# Patient Record
Sex: Female | Born: 1950 | Race: Black or African American | Hispanic: No | Marital: Single | Smoking: Never smoker
Health system: Southern US, Community
[De-identification: ages and names within clinical notes are randomized; demographics above are authoritative.]

## PROBLEM LIST (undated history)

## (undated) DIAGNOSIS — B1920 Unspecified viral hepatitis C without hepatic coma: Secondary | ICD-10-CM

## (undated) HISTORY — DX: Unspecified viral hepatitis C without hepatic coma: B19.20

---

## 2016-05-04 LAB — HM COLONOSCOPY

## 2018-11-11 DIAGNOSIS — I639 Cerebral infarction, unspecified: Secondary | ICD-10-CM | POA: Insufficient documentation

## 2019-06-14 DIAGNOSIS — N179 Acute kidney failure, unspecified: Secondary | ICD-10-CM | POA: Insufficient documentation

## 2019-06-23 LAB — MICROALBUMIN, URINE: Microalb, Ur: 5.2

## 2019-08-26 ENCOUNTER — Emergency Department (HOSPITAL_COMMUNITY): Payer: Medicare Other

## 2019-08-26 ENCOUNTER — Inpatient Hospital Stay (HOSPITAL_COMMUNITY)
Admission: EM | Admit: 2019-08-26 | Discharge: 2019-08-30 | DRG: 871 | Disposition: A | Discharge status: Home Health | Payer: Medicare Other | Attending: Family Medicine | Admitting: Family Medicine

## 2019-08-26 DIAGNOSIS — Z79899 Other long term (current) drug therapy: Secondary | ICD-10-CM

## 2019-08-26 DIAGNOSIS — A4151 Sepsis due to Escherichia coli [E. coli]: Secondary | ICD-10-CM | POA: Diagnosis present

## 2019-08-26 DIAGNOSIS — R197 Diarrhea, unspecified: Secondary | ICD-10-CM | POA: Diagnosis not present

## 2019-08-26 DIAGNOSIS — M069 Rheumatoid arthritis, unspecified: Secondary | ICD-10-CM | POA: Diagnosis present

## 2019-08-26 DIAGNOSIS — Z7952 Long term (current) use of systemic steroids: Secondary | ICD-10-CM | POA: Diagnosis not present

## 2019-08-26 DIAGNOSIS — Z6833 Body mass index (BMI) 33.0-33.9, adult: Secondary | ICD-10-CM | POA: Diagnosis not present

## 2019-08-26 DIAGNOSIS — Z8739 Personal history of other diseases of the musculoskeletal system and connective tissue: Secondary | ICD-10-CM | POA: Diagnosis not present

## 2019-08-26 DIAGNOSIS — N390 Urinary tract infection, site not specified: Secondary | ICD-10-CM | POA: Diagnosis present

## 2019-08-26 DIAGNOSIS — Z794 Long term (current) use of insulin: Secondary | ICD-10-CM

## 2019-08-26 DIAGNOSIS — R7881 Bacteremia: Secondary | ICD-10-CM | POA: Diagnosis not present

## 2019-08-26 DIAGNOSIS — D696 Thrombocytopenia, unspecified: Secondary | ICD-10-CM | POA: Diagnosis present

## 2019-08-26 DIAGNOSIS — E871 Hypo-osmolality and hyponatremia: Secondary | ICD-10-CM | POA: Diagnosis not present

## 2019-08-26 DIAGNOSIS — R932 Abnormal findings on diagnostic imaging of liver and biliary tract: Secondary | ICD-10-CM | POA: Diagnosis not present

## 2019-08-26 DIAGNOSIS — Z8673 Personal history of transient ischemic attack (TIA), and cerebral infarction without residual deficits: Secondary | ICD-10-CM

## 2019-08-26 DIAGNOSIS — K7689 Other specified diseases of liver: Secondary | ICD-10-CM | POA: Diagnosis present

## 2019-08-26 DIAGNOSIS — E119 Type 2 diabetes mellitus without complications: Secondary | ICD-10-CM

## 2019-08-26 DIAGNOSIS — E43 Unspecified severe protein-calorie malnutrition: Secondary | ICD-10-CM | POA: Diagnosis present

## 2019-08-26 DIAGNOSIS — E1165 Type 2 diabetes mellitus with hyperglycemia: Secondary | ICD-10-CM | POA: Diagnosis present

## 2019-08-26 DIAGNOSIS — B9620 Unspecified Escherichia coli [E. coli] as the cause of diseases classified elsewhere: Secondary | ICD-10-CM | POA: Diagnosis present

## 2019-08-26 DIAGNOSIS — A4152 Sepsis due to Pseudomonas: Secondary | ICD-10-CM | POA: Diagnosis present

## 2019-08-26 DIAGNOSIS — Z1612 Extended spectrum beta lactamase (ESBL) resistance: Secondary | ICD-10-CM | POA: Diagnosis present

## 2019-08-26 DIAGNOSIS — E778 Other disorders of glycoprotein metabolism: Secondary | ICD-10-CM | POA: Diagnosis present

## 2019-08-26 DIAGNOSIS — Z8616 Personal history of COVID-19: Secondary | ICD-10-CM | POA: Diagnosis not present

## 2019-08-26 DIAGNOSIS — I517 Cardiomegaly: Secondary | ICD-10-CM | POA: Diagnosis present

## 2019-08-26 DIAGNOSIS — I4581 Long QT syndrome: Secondary | ICD-10-CM | POA: Diagnosis present

## 2019-08-26 DIAGNOSIS — R531 Weakness: Secondary | ICD-10-CM | POA: Diagnosis not present

## 2019-08-26 DIAGNOSIS — E669 Obesity, unspecified: Secondary | ICD-10-CM | POA: Diagnosis present

## 2019-08-26 DIAGNOSIS — R6510 Systemic inflammatory response syndrome (SIRS) of non-infectious origin without acute organ dysfunction: Secondary | ICD-10-CM | POA: Diagnosis present

## 2019-08-26 DIAGNOSIS — R1031 Right lower quadrant pain: Secondary | ICD-10-CM | POA: Diagnosis not present

## 2019-08-26 DIAGNOSIS — E860 Dehydration: Secondary | ICD-10-CM | POA: Diagnosis present

## 2019-08-26 DIAGNOSIS — R8281 Pyuria: Secondary | ICD-10-CM | POA: Diagnosis not present

## 2019-08-26 DIAGNOSIS — I491 Atrial premature depolarization: Secondary | ICD-10-CM | POA: Diagnosis present

## 2019-08-26 LAB — LIPID PANEL
Cholesterol: 95 mg/dL (ref 0–200)
HDL: 32 mg/dL — ABNORMAL LOW (ref >40)
LDL Cholesterol: 44 mg/dL (ref 0–99)
Total CHOL/HDL Ratio: 3 RATIO
Triglycerides: 95 mg/dL (ref <150)
VLDL: 19 mg/dL (ref 0–40)

## 2019-08-26 LAB — COMPREHENSIVE METABOLIC PANEL
ALT: 46 U/L — ABNORMAL HIGH (ref 0–44)
AST: 34 U/L (ref 15–41)
Albumin: 2.5 g/dL — ABNORMAL LOW (ref 3.5–5.0)
Alkaline Phosphatase: 73 U/L (ref 38–126)
Anion gap: 11 (ref 5–15)
BUN: 14 mg/dL (ref 8–23)
CO2: 20 mmol/L — ABNORMAL LOW (ref 22–32)
Calcium: 7.9 mg/dL — ABNORMAL LOW (ref 8.9–10.3)
Chloride: 97 mmol/L — ABNORMAL LOW (ref 98–111)
Creatinine, Ser: 0.83 mg/dL (ref 0.44–1.00)
GFR calc Af Amer: >60 mL/min (ref >60)
GFR calc non Af Amer: >60 mL/min (ref >60)
Glucose, Bld: 280 mg/dL — ABNORMAL HIGH (ref 70–99)
Potassium: 3.5 mmol/L (ref 3.5–5.1)
Sodium: 128 mmol/L — ABNORMAL LOW (ref 135–145)
Total Bilirubin: 1.2 mg/dL (ref 0.3–1.2)
Total Protein: 6.1 g/dL — ABNORMAL LOW (ref 6.5–8.1)

## 2019-08-26 LAB — GLUCOSE, CAPILLARY: Glucose-Capillary: 317 mg/dL — ABNORMAL HIGH (ref 70–99)

## 2019-08-26 LAB — URINALYSIS, ROUTINE W REFLEX MICROSCOPIC
Bacteria, UA: NONE SEEN
Bilirubin Urine: NEGATIVE
Glucose, UA: NEGATIVE mg/dL
Ketones, ur: NEGATIVE mg/dL
Nitrite: NEGATIVE
Protein, ur: NEGATIVE mg/dL
Specific Gravity, Urine: 1.033 — ABNORMAL HIGH (ref 1.005–1.030)
pH: 7 (ref 5.0–8.0)

## 2019-08-26 LAB — CBC WITH DIFFERENTIAL/PLATELET
Abs Immature Granulocytes: 0.09 10*3/uL — ABNORMAL HIGH (ref 0.00–0.07)
Basophils Absolute: 0 10*3/uL (ref 0.0–0.1)
Basophils Relative: 0 %
Eosinophils Absolute: 0.3 10*3/uL (ref 0.0–0.5)
Eosinophils Relative: 4 %
HCT: 41.6 % (ref 36.0–46.0)
Hemoglobin: 14.2 g/dL (ref 12.0–15.0)
Immature Granulocytes: 1 %
Lymphocytes Relative: 24 %
Lymphs Abs: 2.1 10*3/uL (ref 0.7–4.0)
MCH: 33.6 pg (ref 26.0–34.0)
MCHC: 34.1 g/dL (ref 30.0–36.0)
MCV: 98.6 fL (ref 80.0–100.0)
Monocytes Absolute: 0.5 10*3/uL (ref 0.1–1.0)
Monocytes Relative: 6 %
Neutro Abs: 5.8 10*3/uL (ref 1.7–7.7)
Neutrophils Relative %: 65 %
Platelets: 164 10*3/uL (ref 150–400)
RBC: 4.22 MIL/uL (ref 3.87–5.11)
RDW: 12.4 % (ref 11.5–15.5)
WBC: 8.8 10*3/uL (ref 4.0–10.5)
nRBC: 0 % (ref 0.0–0.2)

## 2019-08-26 LAB — LIPASE, BLOOD: Lipase: 71 U/L — ABNORMAL HIGH (ref 11–51)

## 2019-08-26 LAB — SODIUM, URINE, RANDOM: Sodium, Ur: 44 mmol/L

## 2019-08-26 LAB — MAGNESIUM: Magnesium: 1.9 mg/dL (ref 1.7–2.4)

## 2019-08-26 LAB — LACTIC ACID, PLASMA
Lactic Acid, Venous: 2 mmol/L (ref 0.5–1.9)
Lactic Acid, Venous: 1.4 mmol/L (ref 0.5–1.9)

## 2019-08-26 LAB — MRSA PCR SCREENING: MRSA by PCR: NEGATIVE

## 2019-08-26 LAB — CBG MONITORING, ED: Glucose-Capillary: 285 mg/dL — ABNORMAL HIGH (ref 70–99)

## 2019-08-26 MED ORDER — SODIUM CHLORIDE 0.9 % IV BOLUS (SEPSIS)
1000.0000 mL | Freq: Once | INTRAVENOUS | Status: AC
  Administered 2019-08-26: 1000 mL via INTRAVENOUS

## 2019-08-26 MED ORDER — MORPHINE SULFATE (PF) 4 MG/ML IV SOLN
4.0000 mg | Freq: Once | INTRAVENOUS | Status: AC
  Administered 2019-08-26: 4 mg via INTRAVENOUS
  Filled 2019-08-26: qty 1

## 2019-08-26 MED ORDER — ACETAMINOPHEN 325 MG PO TABS
650.0000 mg | ORAL_TABLET | Freq: Four times a day (QID) | ORAL | Status: DC | PRN
  Administered 2019-08-26: 650 mg via ORAL
  Filled 2019-08-26: qty 2

## 2019-08-26 MED ORDER — ACETAMINOPHEN 650 MG RE SUPP: 650.0000 mg | Freq: Four times a day (QID) | RECTAL | Status: DC | PRN

## 2019-08-26 MED ORDER — SODIUM CHLORIDE 0.9 % IV SOLN: INTRAVENOUS | Status: DC

## 2019-08-26 MED ORDER — INSULIN ASPART 100 UNIT/ML ~~LOC~~ SOLN
0.0000 [IU] | Freq: Every day | SUBCUTANEOUS | Status: DC
  Administered 2019-08-26: 4 [IU] via SUBCUTANEOUS
  Administered 2019-08-27: 3 [IU] via SUBCUTANEOUS
  Administered 2019-08-28: 2 [IU] via SUBCUTANEOUS

## 2019-08-26 MED ORDER — SODIUM CHLORIDE 0.9 % IV BOLUS
1000.0000 mL | Freq: Once | INTRAVENOUS | Status: AC
  Administered 2019-08-26: 1000 mL via INTRAVENOUS

## 2019-08-26 MED ORDER — PANTOPRAZOLE SODIUM 40 MG PO TBEC
40.0000 mg | DELAYED_RELEASE_TABLET | Freq: Every day | ORAL | Status: DC
  Administered 2019-08-26 – 2019-08-30 (×5): 40 mg via ORAL
  Filled 2019-08-26 (×5): qty 1

## 2019-08-26 MED ORDER — ENOXAPARIN SODIUM 40 MG/0.4ML ~~LOC~~ SOLN
40.0000 mg | SUBCUTANEOUS | Status: DC
  Administered 2019-08-26: 40 mg via SUBCUTANEOUS
  Filled 2019-08-26: qty 0.4

## 2019-08-26 MED ORDER — INSULIN ASPART 100 UNIT/ML ~~LOC~~ SOLN
0.0000 [IU] | Freq: Three times a day (TID) | SUBCUTANEOUS | Status: DC
  Administered 2019-08-26: 5 [IU] via SUBCUTANEOUS
  Administered 2019-08-27: 2 [IU] via SUBCUTANEOUS
  Administered 2019-08-27: 3 [IU] via SUBCUTANEOUS
  Administered 2019-08-27: 7 [IU] via SUBCUTANEOUS
  Administered 2019-08-28: 2 [IU] via SUBCUTANEOUS

## 2019-08-26 MED ORDER — RAMELTEON 8 MG PO TABS
8.0000 mg | ORAL_TABLET | Freq: Once | ORAL | Status: AC
  Administered 2019-08-26: 8 mg via ORAL
  Filled 2019-08-26: qty 1

## 2019-08-26 MED ORDER — IOHEXOL 300 MG/ML  SOLN
100.0000 mL | Freq: Once | INTRAMUSCULAR | Status: AC | PRN
  Administered 2019-08-26: 100 mL via INTRAVENOUS

## 2019-08-26 MED ORDER — FUROSEMIDE 20 MG PO TABS
20.0000 mg | ORAL_TABLET | Freq: Every day | ORAL | Status: DC
  Administered 2019-08-27: 20 mg via ORAL
  Filled 2019-08-26: qty 1

## 2019-08-26 MED ORDER — PREDNISONE 20 MG PO TABS
40.0000 mg | ORAL_TABLET | Freq: Every day | ORAL | Status: DC
  Administered 2019-08-26 – 2019-08-30 (×5): 40 mg via ORAL
  Filled 2019-08-26 (×5): qty 2

## 2019-08-26 NOTE — ED Notes (Signed)
Messaged admitting provider regarding covid testing.

## 2019-08-26 NOTE — ED Provider Notes (Signed)
[facility] EMERGENCY DEPARTMENT Provider Note   CSN: [id number] Arrival date & time: 08/26/19  9518     History Chief Complaint  Patient presents with   Abdominal Pain    Tracey Riddle is a 69 y.o. female.  Patient is a 68 year old female coming from home with past medical history of a renal disorder presenting to the emergency department for abdominal pain, diarrhea.  Patient is a level 5 caveat due to language barrier.  Unable to obtain patient's African language through the interpreter.  She speaks broken [place].  History was provided by the patient's niece who is at bedside and reports that the patient has had worsening diarrhea over the last 3 days with one episode of vomiting and abdominal pain.  Reports that the patient is feeling pain in the lower belly and is feeling very weak.  The niece reports that the patient was previously living on her own about a year ago until she became sick and was in the hospital.  She was hospitalized with Covid in October and then hospitalized for renal failure again in the end of December.  Recently was discharged from rehab and is now living in [place] with family members.        No past medical history on file.  Patient Active Problem List   Diagnosis Date Noted   Sepsis (HCC) 08/26/2019       OB History   No obstetric history on file.     No family history on file.  Social History   Tobacco Use   Smoking status: Not on file  Substance Use Topics   Alcohol use: Not on file   Drug use: Not on file    Home Medications Prior to Admission medications   Medication Sig Start Date End Date Taking? Authorizing Provider  acetaminophen (TYLENOL) 500 MG tablet Take 1,000 mg by mouth every 6 (six) hours as needed for mild pain.   Yes [provider]  furosemide (LASIX) 20 MG tablet Take 20 mg by mouth daily. 08/07/19  Yes [provider]  insulin lispro (HUMALOG) 100 UNIT/ML KwikPen  Inject 5-10 Units into the skin 2 (two) times daily before a meal. Per scale : 200-300 = 5 units, 300-400= 10 units 08/07/19  Yes [provider]  metoprolol tartrate (LOPRESSOR) 25 MG tablet Take 12.5 mg by mouth 2 (two) times daily. 08/07/19  Yes [provider]  pantoprazole (PROTONIX) 40 MG tablet Take 40 mg by mouth daily. 08/07/19  Yes [provider]  predniSONE (DELTASONE) 20 MG tablet Take 40 mg by mouth daily. On week 2 = 40 mg Q D for 2 weeks started on 08-24-19 08/07/19  Yes [provider]  TOVIAZ 8 MG TB24 tablet Take 8 mg by mouth daily. 08/07/19  Yes [provider]    Allergies    Patient has no known allergies.  Review of Systems   Review of Systems  Unable to perform ROS: Other (Language.)  Constitutional: Positive for appetite change. Negative for chills and fever.  HENT: Negative for congestion and sore throat.   Eyes: Negative for visual disturbance.  Cardiovascular: Negative for chest pain and palpitations.  Gastrointestinal: Positive for abdominal pain and diarrhea. Negative for nausea and vomiting.  Genitourinary: Negative for dysuria.  Skin: Negative for rash.  Neurological: Positive for weakness. Negative for dizziness and light-headedness.  All other systems reviewed and are negative.   Physical Exam Updated Vital Signs BP 113/76    Pulse Marland Kitchen)  116    Temp 97.9 F (36.6 C) (Oral)    Resp 18    SpO2 99%   Physical Exam Vitals and nursing note reviewed.  Constitutional:      General: She is not in acute distress.    Appearance: She is well-developed. She is obese. She is not ill-appearing, toxic-appearing or diaphoretic.  HENT:     Head: Normocephalic.     Mouth/Throat:     Mouth: Mucous membranes are moist.  Cardiovascular:     Rate and Rhythm: Regular rhythm. Tachycardia present.  Pulmonary:     Effort: Pulmonary effort is normal.     Breath sounds: Normal breath sounds.  Abdominal:     General: Bowel sounds  are decreased. There is distension.     Palpations: Abdomen is soft.     Tenderness: There is abdominal tenderness in the right lower quadrant, suprapubic area and left lower quadrant.  Skin:    General: Skin is warm.     Capillary Refill: Capillary refill takes less than 2 seconds.  Neurological:     Mental Status: She is alert.  Psychiatric:        Behavior: Behavior normal.     ED Results / Procedures / Treatments   Labs (all labs ordered are listed, but only abnormal results are displayed) Labs Reviewed  CBC WITH DIFFERENTIAL/PLATELET - Abnormal; Notable for the following components:      Result Value   Abs Immature Granulocytes 0.09 (*)    All other components within normal limits  COMPREHENSIVE METABOLIC PANEL - Abnormal; Notable for the following components:   Sodium 128 (*)    Chloride 97 (*)    CO2 20 (*)    Glucose, Bld 280 (*)    Calcium 7.9 (*)    Total Protein 6.1 (*)    Albumin 2.5 (*)    ALT 46 (*)    All other components within normal limits  LIPASE, BLOOD - Abnormal; Notable for the following components:   Lipase 71 (*)    All other components within normal limits  URINALYSIS, ROUTINE W REFLEX MICROSCOPIC - Abnormal; Notable for the following components:   APPearance HAZY (*)    Specific Gravity, Urine 1.033 (*)    Hgb urine dipstick SMALL (*)    Leukocytes,Ua MODERATE (*)    All other components within normal limits  LACTIC ACID, PLASMA - Abnormal; Notable for the following components:   Lactic Acid, Venous 2.0 (*)    All other components within normal limits  GI PATHOGEN PANEL BY PCR, STOOL  C DIFFICILE QUICK SCREEN W PCR REFLEX  URINE CULTURE  CULTURE, BLOOD (ROUTINE X 2)  CULTURE, BLOOD (ROUTINE X 2)  LACTIC ACID, PLASMA  MAGNESIUM  LIPID PANEL  SODIUM, URINE, RANDOM  HEMOGLOBIN A1C    EKG EKG Interpretation  Date/Time:  Wednesday August 26 2019 08:57:01 EST Ventricular Rate:  111 PR Interval:    QRS Duration: 81 QT  Interval:  371 QTC Calculation: 505 R Axis:   42 Text Interpretation: Sinus tachycardia Atrial premature complexes Left atrial enlargement Abnormal R-wave progression, early transition Prolonged QT interval No old tracing to compare Confirmed by Linwood Dibbles [phone]) on 08/26/2019 9:10:51 AM Also confirmed by Linwood Dibbles [phone]), editor Elita Quick ([id number])  on 08/26/2019 9:54:37 AM   Radiology CT ABDOMEN PELVIS W CONTRAST  Result Date: 08/26/2019 CLINICAL DATA:  Lower abdominal pain, nausea/vomiting EXAM: CT ABDOMEN AND PELVIS WITH CONTRAST TECHNIQUE: Multidetector CT imaging of the abdomen and pelvis  was performed using the standard protocol following bolus administration of intravenous contrast. CONTRAST:  179mL OMNIPAQUE IOHEXOL 300 MG/ML  SOLN COMPARISON:  None. FINDINGS: Motion degraded images. Lower chest: Mild dependent atelectasis in the bilateral lower lobes. Hepatobiliary: Mildly nodular hepatic contour, raising the possibility of mild cirrhosis. No suspicious/enhancing hepatic lesions. Gallbladder is unremarkable. No intrahepatic or extrahepatic ductal dilatation. Pancreas: Within normal limits. Spleen: The normal limits. Adrenals/Urinary Tract: Adrenal glands are within normal limits. 10 mm left upper pole renal cyst (series 3/image 26). Right kidney is within normal limits. No hydronephrosis. Bladder is within normal limits. Stomach/Bowel: Stomach is within normal limits. No evidence of bowel obstruction. Normal appendix (series 3/image 50). No colonic wall thickening or inflammatory changes. Vascular/Lymphatic: No evidence of abdominal aortic aneurysm. Atherosclerotic calcifications of the abdominal aorta and branch vessels. No suspicious abdominopelvic lymphadenopathy. Reproductive: Uterus is within normal limits. Left ovary is within normal limits.  No right adnexal mass. Other: No abdominopelvic ascites. Musculoskeletal: Mild degenerative changes at L5-S1. IMPRESSION: Motion degraded  images. No evidence of bowel obstruction.  Normal appendix. No CT findings to account for the patient's lower abdominal pain. Mildly nodular hepatic contour, raising the possibility of mild cirrhosis. Electronically Signed   By: Julian Hy M.D.   On: 08/26/2019 11:40    Procedures Procedures (including critical care time)  Medications Ordered in ED Medications  sodium chloride 0.9 % bolus 1,000 mL (1,000 mLs Intravenous New Bag/Given 08/26/19 1430)    Followed by  0.9 %  sodium chloride infusion (has no administration in time range)  predniSONE (DELTASONE) tablet 40 mg (40 mg Oral Given 08/26/19 1501)  pantoprazole (PROTONIX) EC tablet 40 mg (40 mg Oral Given 08/26/19 1501)  enoxaparin (LOVENOX) injection 40 mg (has no administration in time range)  acetaminophen (TYLENOL) tablet 650 mg (has no administration in time range)    Or  acetaminophen (TYLENOL) suppository 650 mg (has no administration in time range)  insulin aspart (novoLOG) injection 0-9 Units (has no administration in time range)  insulin aspart (novoLOG) injection 0-5 Units (has no administration in time range)  sodium chloride 0.9 % bolus 1,000 mL (0 mLs Intravenous Stopped 08/26/19 1100)  morphine 4 MG/ML injection 4 mg (4 mg Intravenous Given 08/26/19 0957)  iohexol (OMNIPAQUE) 300 MG/ML solution 100 mL (100 mLs Intravenous Contrast Given 08/26/19 1127)    ED Course  I have reviewed the triage vital signs and the nursing notes.  Pertinent labs & imaging results that were available during my care of the patient were reviewed by me and considered in my medical decision making (see chart for details).    MDM Rules/Calculators/A&P                      CRITICAL CARE Performed by: Alveria Apley   Total critical care time: 30 minutes  Critical care time was exclusive of separately billable procedures and treating other patients.  Critical care was necessary to treat or prevent imminent or life-threatening  deterioration.  Critical care was time spent personally by me on the following activities: development of treatment plan with patient and/or surrogate as well as nursing, discussions with consultants, evaluation of patient's response to treatment, examination of patient, obtaining history from patient or surrogate, ordering and performing treatments and interventions, ordering and review of laboratory studies, ordering and review of radiographic studies, pulse oximetry and re-evaluation of patient's condition.  Final Clinical Impression(s) / ED Diagnoses Final diagnoses:  Hyponatremia  Diarrhea, unspecified type  Rx / DC Orders ED Discharge Orders    None       Jeral Pinch 08/26/19 1530    Linwood Dibbles, MD 08/27/19 [phone]

## 2019-08-26 NOTE — ED Notes (Signed)
Taken to CT.

## 2019-08-26 NOTE — ED Notes (Signed)
RN called phlebotomy to get cultures

## 2019-08-26 NOTE — ED Triage Notes (Signed)
Came in via EMS; from home. C/O  Lower abdominal pain x 3 days + diarrhea,

## 2019-08-26 NOTE — ED Notes (Signed)
Per Dr Jennette Kettle, pt previously had covid and has had both vaccines. Testing is not needed at this time.
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
## 2019-08-26 NOTE — ED Notes (Signed)
ED TO INPATIENT HANDOFF REPORT  ED Nurse Name and Phone #: [phone] [facility]  S Name/Age/Gender Tracey Riddle 69 y.o. female Room/Bed: 053C/053C  Code Status   Code Status: Full Code  Home/SNF/Other Home Patient oriented to: self, place, time and situation Is this baseline? Yes   Triage Complete: Triage complete  Chief Complaint Sepsis [facility]) [A41.9]  Triage Note Came in via EMS; from home. C/O  Lower abdominal pain x 3 days + diarrhea,     Allergies No Known Allergies  Level of Care/Admitting Diagnosis ED Disposition    ED Disposition Condition Comment   Admit  Hospital Area: [facility] [[id number]]  Level of Care: Med-Surg [16]  May admit patient to [facility] or [facility] if equivalent level of care is available:: Yes  Covid Evaluation: Asymptomatic Screening Protocol (No Symptoms)  Diagnosis: Sepsis [facility]) [[id number]]  Admitting Physician: Shirlean Mylar [[id number]]  Attending Physician: Nestor Ramp [[id number]]  Estimated length of stay: past midnight tomorrow  Certification:: I certify this patient will need inpatient services for at least 2 midnights       B Medical/Surgery History No past medical history on file.    A IV Location/Drains/Wounds Patient Lines/Drains/Airways Status   Active Line/Drains/Airways    Name:   Placement date:   Placement time:   Site:   Days:   Peripheral IV 08/26/19 Right Wrist   08/26/19    0852    Wrist   less than 1          Intake/Output Last 24 hours  Intake/Output Summary (Last 24 hours) at 08/26/2019 2011 Last data filed at 08/26/2019 1555 Gross per 24 hour  Intake 1000 ml  Output --  Net 1000 ml    Labs/Imaging Results for orders placed or performed during the hospital encounter of 08/26/19 (from the past 48 hour(s))  CBC with Differential     Status: Abnormal   Collection Time: 08/26/19  8:36 AM  Result Value Ref Range   WBC 8.8 4.0 - 10.5 K/uL   RBC 4.22 3.87 - 5.11 MIL/uL    Hemoglobin 14.2 12.0 - 15.0 g/dL   HCT 75.1 02.5 - 85.2 %   MCV 98.6 80.0 - 100.0 fL   MCH 33.6 26.0 - 34.0 pg   MCHC 34.1 30.0 - 36.0 g/dL   RDW 77.8 24.2 - 35.3 %   Platelets 164 150 - 400 K/uL   nRBC 0.0 0.0 - 0.2 %   Neutrophils Relative % 65 %   Neutro Abs 5.8 1.7 - 7.7 K/uL   Lymphocytes Relative 24 %   Lymphs Abs 2.1 0.7 - 4.0 K/uL   Monocytes Relative 6 %   Monocytes Absolute 0.5 0.1 - 1.0 K/uL   Eosinophils Relative 4 %   Eosinophils Absolute 0.3 0.0 - 0.5 K/uL   Basophils Relative 0 %   Basophils Absolute 0.0 0.0 - 0.1 K/uL   Immature Granulocytes 1 %   Abs Immature Granulocytes 0.09 (H) 0.00 - 0.07 K/uL    Comment: Performed at [facility], [address], [place], [place] [address]  Comprehensive metabolic panel     Status: Abnormal   Collection Time: 08/26/19  8:36 AM  Result Value Ref Range   Sodium 128 (L) 135 - 145 mmol/L   Potassium 3.5 3.5 - 5.1 mmol/L   Chloride 97 (L) 98 - 111 mmol/L   CO2 20 (L) 22 - 32 mmol/L   Glucose, Bld 280 (H) 70 - 99 mg/dL  Comment: Glucose reference range applies only to samples taken after fasting for at least 8 hours.   BUN 14 8 - 23 mg/dL   Creatinine, Ser 6.44 0.44 - 1.00 mg/dL   Calcium 7.9 (L) 8.9 - 10.3 mg/dL   Total Protein 6.1 (L) 6.5 - 8.1 g/dL   Albumin 2.5 (L) 3.5 - 5.0 g/dL   AST 34 15 - 41 U/L   ALT 46 (H) 0 - 44 U/L   Alkaline Phosphatase 73 38 - 126 U/L   Total Bilirubin 1.2 0.3 - 1.2 mg/dL   GFR calc non Af Amer >60 >60 mL/min   GFR calc Af Amer >60 >60 mL/min   Anion gap 11 5 - 15    Comment: Performed at [facility], [address], [place], [place] [address]  Lipase, blood     Status: Abnormal   Collection Time: 08/26/19  8:36 AM  Result Value Ref Range   Lipase 71 (H) 11 - 51 U/L    Comment: Performed at [facility], [address], [place], [place] [address]  Lactic acid, plasma     Status: Abnormal   Collection Time: 08/26/19  8:58 AM  Result Value Ref Range   Lactic Acid,  Venous 2.0 (HH) 0.5 - 1.9 mmol/L    Comment: CRITICAL RESULT CALLED TO, READ BACK BY AND VERIFIED WITH: Iona Beard RN [phone] BY A BENNETT Performed at [facility], [address], [place], [place] [address]   Magnesium     Status: None   Collection Time: 08/26/19  9:34 AM  Result Value Ref Range   Magnesium 1.9 1.7 - 2.4 mg/dL    Comment: Performed at [facility], [address], [place], [place] [address]  Lactic acid, plasma     Status: None   Collection Time: 08/26/19 11:03 AM  Result Value Ref Range   Lactic Acid, Venous 1.4 0.5 - 1.9 mmol/L    Comment: Performed at [facility], [address], [place], [place] [address]  Urinalysis, Routine w reflex microscopic     Status: Abnormal   Collection Time: 08/26/19 11:59 AM  Result Value Ref Range   Color, Urine YELLOW YELLOW   APPearance HAZY (A) CLEAR   Specific Gravity, Urine 1.033 (H) 1.005 - 1.030   pH 7.0 5.0 - 8.0   Glucose, UA NEGATIVE NEGATIVE mg/dL   Hgb urine dipstick SMALL (A) NEGATIVE   Bilirubin Urine NEGATIVE NEGATIVE   Ketones, ur NEGATIVE NEGATIVE mg/dL   Protein, ur NEGATIVE NEGATIVE mg/dL   Nitrite NEGATIVE NEGATIVE   Leukocytes,Ua MODERATE (A) NEGATIVE   RBC / HPF 0-5 0 - 5 RBC/hpf   WBC, UA 21-50 0 - 5 WBC/hpf   Bacteria, UA NONE SEEN NONE SEEN   Squamous Epithelial / LPF 0-5 0 - 5    Comment: Performed at [facility], [address], [place], [place] [address]  Lipid panel     Status: Abnormal   Collection Time: 08/26/19  3:30 PM  Result Value Ref Range   Cholesterol 95 0 - 200 mg/dL   Triglycerides 95 <355 mg/dL   HDL 32 (L) >73 mg/dL   Total CHOL/HDL Ratio 3.0 RATIO   VLDL 19 0 - 40 mg/dL   LDL Cholesterol 44 0 - 99 mg/dL    Comment:        Total Cholesterol/HDL:CHD Risk Coronary Heart Disease Risk Table  Men   Women  1/2 Average Risk   3.4   3.3  Average Risk       5.0   4.4  2 X Average Risk   9.6   7.1  3 X Average Risk  23.4   11.0         Use the calculated Patient Ratio above and the CHD Risk Table to determine the patient's CHD Risk.        ATP III CLASSIFICATION (LDL):  <100     mg/dL   Optimal  161-096  mg/dL   Near or Above                    Optimal  130-159  mg/dL   Borderline  045-409  mg/dL   High  >811     mg/dL   Very High Performed at [facility], [address], [place], [place] [address]   Sodium, urine, random     Status: None   Collection Time: 08/26/19  4:18 PM  Result Value Ref Range   Sodium, Ur 44 mmol/L    Comment: Performed at [facility], [address], [place], [place] [address]  CBG monitoring, ED     Status: Abnormal   Collection Time: 08/26/19  5:48 PM  Result Value Ref Range   Glucose-Capillary 285 (H) 70 - 99 mg/dL    Comment: Glucose reference range applies only to samples taken after fasting for at least 8 hours.   CT ABDOMEN PELVIS W CONTRAST  Result Date: 08/26/2019 CLINICAL DATA:  Lower abdominal pain, nausea/vomiting EXAM: CT ABDOMEN AND PELVIS WITH CONTRAST TECHNIQUE: Multidetector CT imaging of the abdomen and pelvis was performed using the standard protocol following bolus administration of intravenous contrast. CONTRAST:  OMNIPAQUE IOHEXOL 300 MG/ML  SOLN COMPARISON:  None. FINDINGS: Motion degraded images. Lower chest: Mild dependent atelectasis in the bilateral lower lobes. Hepatobiliary: Mildly nodular hepatic contour, raising the possibility of mild cirrhosis. No suspicious/enhancing hepatic lesions. Gallbladder is unremarkable. No intrahepatic or extrahepatic ductal dilatation. Pancreas: Within normal limits. Spleen: The normal limits. Adrenals/Urinary Tract: Adrenal glands are within normal limits. 10 mm left upper pole renal cyst (series 3/image 26). Right kidney is within normal limits. No hydronephrosis. Bladder is within normal limits. Stomach/Bowel: Stomach is within normal limits. No evidence of bowel obstruction. Normal appendix (series 3/image 50).  No colonic wall thickening or inflammatory changes. Vascular/Lymphatic: No evidence of abdominal aortic aneurysm. Atherosclerotic calcifications of the abdominal aorta and branch vessels. No suspicious abdominopelvic lymphadenopathy. Reproductive: Uterus is within normal limits. Left ovary is within normal limits.  No right adnexal mass. Other: No abdominopelvic ascites. Musculoskeletal: Mild degenerative changes at L5-S1. IMPRESSION: Motion degraded images. No evidence of bowel obstruction.  Normal appendix. No CT findings to account for the patient's lower abdominal pain. Mildly nodular hepatic contour, raising the possibility of mild cirrhosis. Electronically Signed   By: Charline Bills M.D.   On: 08/26/2019 11:40    Pending Labs Unresulted Labs (From admission, onward)    Start     Ordered   09/02/19 0500  Creatinine, serum  (enoxaparin (LOVENOX)    CrCl >/= 30 ml/min)  Weekly,   R    Comments: while on enoxaparin therapy    08/26/19 1443   08/27/19 0500  Brain natriuretic peptide  Tomorrow morning,   R     08/26/19 1443   08/27/19 0500  Comprehensive metabolic panel  Tomorrow morning,   R  08/26/19 1443   08/27/19 0500  CBC  Tomorrow morning,   R     08/26/19 1443   08/27/19 0500  HCV Ab Reflex to Quant PCR  Tomorrow morning,   R     08/26/19 1626   08/27/19 0500  Hepatitis A antibody, IgM  Tomorrow morning,   R     08/26/19 1626   08/27/19 0500  Hepatitis B surface antigen  Tomorrow morning,   R     08/26/19 1626   08/27/19 0500  Hepatitis B surface antibody  Tomorrow morning,   R     08/26/19 1626   08/26/19 1443  Hemoglobin A1c  Once,   STAT    Comments: To assess prior glycemic control    08/26/19 1443   08/26/19 1301  Blood culture (routine x 2)  BLOOD CULTURE X 2,   STAT     08/26/19 1300   08/26/19 1246  Urine culture  Add-on,   AD     08/26/19 1245   08/26/19 1028  C Difficile Quick Screen w PCR reflex  (Gastrointestinal Panel by PCR, Stool                                                                                                                                                      *Does Not include CLOSTRIDIUM DIFFICILE testing.**If CDIFF testing is needed, select the C Difficile Quick Screen w PCR reflex order below)  Once, for 24 hours,   STAT     08/26/19 1027   08/26/19 1027  GI pathogen panel by PCR, stool  (Gastrointestinal Panel by PCR, Stool                                                                                                                                                     *Does Not include CLOSTRIDIUM DIFFICILE testing.**If CDIFF testing is needed, select the C Difficile Quick Screen w PCR reflex order below)  Once,   STAT     08/26/19 1027          Vitals/Pain Today's Vitals   08/26/19 1806 08/26/19 1815 08/26/19 1900 08/26/19 2010  BP:  111/81 119/77  Pulse:  (!) 118 (!) 112   Resp:  18 18   Temp:      TempSrc:      SpO2:  98% 98%   PainSc: 3    0-No pain    Isolation Precautions Enteric precautions (UV disinfection)  Medications Medications  sodium chloride 0.9 % bolus 1,000 mL (0 mLs Intravenous Stopped 08/26/19 1555)    Followed by  0.9 %  sodium chloride infusion ( Intravenous Transfusing/Transfer 08/26/19 2010)  predniSONE (DELTASONE) tablet 40 mg (40 mg Oral Given 08/26/19 1501)  pantoprazole (PROTONIX) EC tablet 40 mg (40 mg Oral Given 08/26/19 1501)  enoxaparin (LOVENOX) injection 40 mg (40 mg Subcutaneous Given 08/26/19 1555)  acetaminophen (TYLENOL) tablet 650 mg (650 mg Oral Given 08/26/19 1611)    Or  acetaminophen (TYLENOL) suppository 650 mg ( Rectal See Alternative 08/26/19 1611)  insulin aspart (novoLOG) injection 0-9 Units (5 Units Subcutaneous Given 08/26/19 1753)  insulin aspart (novoLOG) injection 0-5 Units (has no administration in time range)  furosemide (LASIX) tablet 20 mg (has no administration in time range)  sodium chloride 0.9 % bolus 1,000 mL (0 mLs Intravenous Stopped 08/26/19 1100)   morphine 4 MG/ML injection 4 mg (4 mg Intravenous Given 08/26/19 0957)  iohexol (OMNIPAQUE) 300 MG/ML solution 100 mL (100 mLs Intravenous Contrast Given 08/26/19 1127)    Mobility non-ambulatory     Focused Assessments Cardiac Assessment Handoff:  Cardiac Rhythm: Sinus tachycardia No results found for: CKTOTAL, CKMB, CKMBINDEX, TROPONINI No results found for: DDIMER Does the Patient currently have chest pain? No    Patient remains tachy on monitor in the low 100's but has improved while in the ED    R Recommendations: See Admitting Provider Note  Report given to: Junie Panning, [place] RN   Additional Notes:  Patient is alert and oriented but needs interpreter; Niece has been subbing as interpreter per patient request. Patient's vitals and pain has improved while in the ED and no reports of diarrhea. Patient on perwick and has been urinating.

## 2019-08-27 ENCOUNTER — Encounter (HOSPITAL_COMMUNITY): Payer: Self-pay | Admitting: Family Medicine

## 2019-08-27 DIAGNOSIS — R197 Diarrhea, unspecified: Secondary | ICD-10-CM

## 2019-08-27 DIAGNOSIS — B9620 Unspecified Escherichia coli [E. coli] as the cause of diseases classified elsewhere: Secondary | ICD-10-CM

## 2019-08-27 DIAGNOSIS — R7881 Bacteremia: Secondary | ICD-10-CM | POA: Diagnosis present

## 2019-08-27 DIAGNOSIS — A4152 Sepsis due to Pseudomonas: Secondary | ICD-10-CM | POA: Diagnosis present

## 2019-08-27 DIAGNOSIS — R6510 Systemic inflammatory response syndrome (SIRS) of non-infectious origin without acute organ dysfunction: Secondary | ICD-10-CM

## 2019-08-27 DIAGNOSIS — D696 Thrombocytopenia, unspecified: Secondary | ICD-10-CM | POA: Diagnosis present

## 2019-08-27 DIAGNOSIS — E119 Type 2 diabetes mellitus without complications: Secondary | ICD-10-CM

## 2019-08-27 DIAGNOSIS — E871 Hypo-osmolality and hyponatremia: Secondary | ICD-10-CM

## 2019-08-27 DIAGNOSIS — R8281 Pyuria: Secondary | ICD-10-CM | POA: Diagnosis present

## 2019-08-27 DIAGNOSIS — R932 Abnormal findings on diagnostic imaging of liver and biliary tract: Secondary | ICD-10-CM

## 2019-08-27 DIAGNOSIS — Z7952 Long term (current) use of systemic steroids: Secondary | ICD-10-CM | POA: Diagnosis present

## 2019-08-27 DIAGNOSIS — E778 Other disorders of glycoprotein metabolism: Secondary | ICD-10-CM | POA: Diagnosis present

## 2019-08-27 HISTORY — DX: Type 2 diabetes mellitus without complications: E11.9

## 2019-08-27 HISTORY — DX: Abnormal findings on diagnostic imaging of liver and biliary tract: R93.2

## 2019-08-27 LAB — C DIFFICILE QUICK SCREEN W PCR REFLEX
C Diff antigen: NEGATIVE
C Diff interpretation: NOT DETECTED
C Diff toxin: NEGATIVE

## 2019-08-27 LAB — COMPREHENSIVE METABOLIC PANEL
ALT: 30 U/L (ref 0–44)
AST: 15 U/L (ref 15–41)
Albumin: 2 g/dL — ABNORMAL LOW (ref 3.5–5.0)
Alkaline Phosphatase: 57 U/L (ref 38–126)
Anion gap: 10 (ref 5–15)
BUN: 10 mg/dL (ref 8–23)
CO2: 21 mmol/L — ABNORMAL LOW (ref 22–32)
Calcium: 7.7 mg/dL — ABNORMAL LOW (ref 8.9–10.3)
Chloride: 100 mmol/L (ref 98–111)
Creatinine, Ser: 0.75 mg/dL (ref 0.44–1.00)
GFR calc Af Amer: >60 mL/min (ref >60)
GFR calc non Af Amer: >60 mL/min (ref >60)
Glucose, Bld: 196 mg/dL — ABNORMAL HIGH (ref 70–99)
Potassium: 4 mmol/L (ref 3.5–5.1)
Sodium: 131 mmol/L — ABNORMAL LOW (ref 135–145)
Total Bilirubin: 0.9 mg/dL (ref 0.3–1.2)
Total Protein: 4.9 g/dL — ABNORMAL LOW (ref 6.5–8.1)

## 2019-08-27 LAB — CBC
HCT: 31.9 % — ABNORMAL LOW (ref 36.0–46.0)
Hemoglobin: 10.9 g/dL — ABNORMAL LOW (ref 12.0–15.0)
MCH: 33.6 pg (ref 26.0–34.0)
MCHC: 34.2 g/dL (ref 30.0–36.0)
MCV: 98.5 fL (ref 80.0–100.0)
Platelets: 109 10*3/uL — ABNORMAL LOW (ref 150–400)
RBC: 3.24 MIL/uL — ABNORMAL LOW (ref 3.87–5.11)
RDW: 12.5 % (ref 11.5–15.5)
WBC: 4.8 10*3/uL (ref 4.0–10.5)
nRBC: 0 % (ref 0.0–0.2)

## 2019-08-27 LAB — BLOOD CULTURE ID PANEL (REFLEXED)
Acinetobacter baumannii: NOT DETECTED
Candida albicans: NOT DETECTED
Candida glabrata: NOT DETECTED
Candida krusei: NOT DETECTED
Candida parapsilosis: NOT DETECTED
Candida tropicalis: NOT DETECTED
Carbapenem resistance: NOT DETECTED
Enterobacter cloacae complex: NOT DETECTED
Enterobacteriaceae species: DETECTED — AB
Enterococcus species: NOT DETECTED
Escherichia coli: DETECTED — AB
Haemophilus influenzae: NOT DETECTED
Klebsiella oxytoca: NOT DETECTED
Klebsiella pneumoniae: NOT DETECTED
Listeria monocytogenes: NOT DETECTED
Neisseria meningitidis: NOT DETECTED
Proteus species: NOT DETECTED
Pseudomonas aeruginosa: DETECTED — AB
Serratia marcescens: NOT DETECTED
Staphylococcus aureus (BCID): NOT DETECTED
Staphylococcus species: NOT DETECTED
Streptococcus agalactiae: NOT DETECTED
Streptococcus pneumoniae: NOT DETECTED
Streptococcus pyogenes: NOT DETECTED
Streptococcus species: NOT DETECTED

## 2019-08-27 LAB — HEPATITIS A ANTIBODY, IGM: Hep A IgM: NONREACTIVE

## 2019-08-27 LAB — HEMOGLOBIN A1C
Hgb A1c MFr Bld: 8 % — ABNORMAL HIGH (ref 4.8–5.6)
Mean Plasma Glucose: 183 mg/dL

## 2019-08-27 LAB — GLUCOSE, CAPILLARY
Glucose-Capillary: 199 mg/dL — ABNORMAL HIGH (ref 70–99)
Glucose-Capillary: 216 mg/dL — ABNORMAL HIGH (ref 70–99)
Glucose-Capillary: 346 mg/dL — ABNORMAL HIGH (ref 70–99)
Glucose-Capillary: 257 mg/dL — ABNORMAL HIGH (ref 70–99)

## 2019-08-27 LAB — HEPATITIS B SURFACE ANTIGEN: Hepatitis B Surface Ag: NONREACTIVE

## 2019-08-27 LAB — BRAIN NATRIURETIC PEPTIDE: B Natriuretic Peptide: 49.7 pg/mL (ref 0.0–100.0)

## 2019-08-27 MED ORDER — SODIUM CHLORIDE 0.9 % IV SOLN
2.0000 g | Freq: Three times a day (TID) | INTRAVENOUS | Status: DC
  Administered 2019-08-27 – 2019-08-28 (×3): 2 g via INTRAVENOUS
  Filled 2019-08-27 (×4): qty 2

## 2019-08-27 MED ORDER — ENOXAPARIN SODIUM 60 MG/0.6ML ~~LOC~~ SOLN
0.5000 mg/kg | SUBCUTANEOUS | Status: DC
  Administered 2019-08-27 – 2019-08-30 (×4): 45 mg via SUBCUTANEOUS
  Filled 2019-08-27 (×4): qty 0.6

## 2019-08-27 NOTE — Progress Notes (Signed)
PHARMACY - PHYSICIAN COMMUNICATION CRITICAL VALUE ALERT - BLOOD CULTURE IDENTIFICATION (BCID)  Tracey Riddle is an 69 y.o. female who presented to [facility] on 08/26/2019   Assessment:  ecoli and pseudomonas on bcid  Name of physician (or Provider) Contacted: FMTS  Current antibiotics: None  Changes to prescribed antibiotics recommended:  Add cefepime 2 g q8h  Results for orders placed or performed during the hospital encounter of 08/26/19  Blood Culture ID Panel (Reflexed) (Collected: 08/26/2019  3:33 PM)  Result Value Ref Range   Enterococcus species NOT DETECTED NOT DETECTED   Listeria monocytogenes NOT DETECTED NOT DETECTED   Staphylococcus species NOT DETECTED NOT DETECTED   Staphylococcus aureus (BCID) NOT DETECTED NOT DETECTED   Streptococcus species NOT DETECTED NOT DETECTED   Streptococcus agalactiae NOT DETECTED NOT DETECTED   Streptococcus pneumoniae NOT DETECTED NOT DETECTED   Streptococcus pyogenes NOT DETECTED NOT DETECTED   Acinetobacter baumannii NOT DETECTED NOT DETECTED   Enterobacteriaceae species DETECTED (A) NOT DETECTED   Enterobacter cloacae complex NOT DETECTED NOT DETECTED   Escherichia coli DETECTED (A) NOT DETECTED   Klebsiella oxytoca NOT DETECTED NOT DETECTED   Klebsiella pneumoniae NOT DETECTED NOT DETECTED   Proteus species NOT DETECTED NOT DETECTED   Serratia marcescens NOT DETECTED NOT DETECTED   Carbapenem resistance NOT DETECTED NOT DETECTED   Haemophilus influenzae NOT DETECTED NOT DETECTED   Neisseria meningitidis NOT DETECTED NOT DETECTED   Pseudomonas aeruginosa DETECTED (A) NOT DETECTED   Candida albicans NOT DETECTED NOT DETECTED   Candida glabrata NOT DETECTED NOT DETECTED   Candida krusei NOT DETECTED NOT DETECTED   Candida parapsilosis NOT DETECTED NOT DETECTED   Candida tropicalis NOT DETECTED NOT DETECTED   Elmer Sow, PharmD, BCPS, BCCCP Clinical Pharmacist [phone]  Please check AMION for all [facility]  numbers  08/27/2019 3:49 PM

## 2019-08-27 NOTE — Progress Notes (Signed)
PT Cancellation Note  Patient Details Name: Icy Fuhrmann MRN: [id number] DOB: [birth year]   Cancelled Treatment:    Reason Eval/Treat Not Completed: Other (comment) Orders are to start Friday 3/12.   Rayetta Humphrey 08/27/2019, 9:43 AM

## 2019-08-27 NOTE — Plan of Care (Signed)

## 2019-08-27 NOTE — Discharge Summary (Signed)
[facility] Discharge Summary  Patient name: Tracey Riddle Medical record number: [id number] Date of birth: [birth year] Age: 69 y.o. 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  Due to tachycardia and tachypnea with elevated LA, patient had sepsis work-up including blood cultures.  These blood cultures were positive for Pseudomonas and E. Coli that was pan sensitive. Urine culture positive with ESBL producing E. Coli.  Patient remained stable and was started on meropenem with great improvement. Transitioned to levaquin to complete a full 7 day course at discharge. Blood cultures were collected and not resulted at time of discharge. Sodium on day of discharge 129.  Likely due to extrarenal losses, urine sodium within normal limits. Asymptomatic.  Patient on steroids on admission for unknown cause. Discharge paperwork from [place] on 08/12/2019 with diagnosis codes for RA and myositis, likely why patient is on steroid taper. Patient asymptomatic at this admission for these issues.  Issues for Follow Up:  1. Monitor  liver function periodically as patient had nodular hepatic contour on CT.  Hepatitis panel was negative with exception for low hepatitis B surface antibody indicating lack of immunity. Hep C still pending at discharge 2. Levaquin treatment will be completed 3/21 for full course. Blood cultures were collected on day of discharge and any abnormal results should be called to niece. 3. PCP-  1. follow up on need for repeat colonoscopy. 2. Steroid taper: RA and myositits? 3. Na level (hyponatremic on admission), patient's lasix was held and given fluids. 4. Tolerance of statin   Significant Procedures: None  Significant Labs and Imaging:  Recent Labs  Lab 08/28/19 0144 08/29/19 0837 08/30/19 0156  WBC 5.1 4.5 6.1  HGB 11.5* 11.1* 11.8*  HCT 33.8* 31.8* 34.5*  PLT 130* 125* 140*   Recent Labs  Lab 08/26/19 0836 08/26/19 0836 08/26/19 0934 08/27/19 0533 08/27/19 0533 08/28/19 0144 08/28/19 0144 08/29/19 0837 08/30/19 0156  NA 128*  --   --  131*  --  131*  --  132* 129*  K 3.5   < >  --  4.0   < > 4.0   < > 3.1* 4.5  CL 97*  --   --  100  --  99  --  102 101  CO2 20*  --   --  21*  --  22  --  21* 18*  GLUCOSE 280*  --   --  196*  --  153*  --  123* 201*  BUN 14  --   --  10  --  10  --  7* 8  CREATININE 0.83  --   --  0.75  --  0.82  --  0.74 0.70  CALCIUM 7.9*  --   --  7.7*  --  8.2*  --  7.8* 7.9*  MG  --   --  1.9  --   --   --   --   --   --   ALKPHOS 73  --   --  57  --   --   --   --  73  AST 34  --   --  15  --   --   --   --  28  ALT 46*  --   --  30  --   --   --   --  35  ALBUMIN 2.5*  --   --  2.0*  --   --   --   --  2.2*   < > = values in this interval not displayed.    Results/Tests Pending at Time of Discharge: HCV antibody, blood cultures  Discharge Medications:  Allergies as of 08/30/2019   No Known Allergies     Medication List    STOP taking these medications   furosemide 20 MG tablet Commonly known as: LASIX   metoprolol tartrate 25 MG  tablet Commonly known as: LOPRESSOR     TAKE these medications   acetaminophen 500 MG tablet Commonly known as: TYLENOL Take 1,000 mg by mouth every 6 (six) hours as needed for mild pain.   aspirin 81 MG EC tablet Take 1 tablet (81 mg total) by mouth daily.   atorvastatin 40 MG tablet Commonly known as: LIPITOR Take 1 tablet (40 mg total) by mouth daily at 6 PM.   blood glucose meter kit and supplies Kit Dispense based on patient and insurance preference. Use up to four times daily as directed. (FOR ICD-9 250.00, 250.01).   insulin lispro 100 UNIT/ML KwikPen Commonly known as: HUMALOG Inject 5-10 Units into the skin 2 (two) times daily before a meal. Per scale : 200-300 = 5 units, 300-400= 10 units   levofloxacin 750 MG tablet Commonly known as: LEVAQUIN Take 1 tablet (750 mg total) by mouth daily for 7 days.   pantoprazole 40 MG tablet Commonly known as: PROTONIX Take 40 mg by mouth daily.   predniSONE 20 MG tablet Commonly known as: DELTASONE Take 40 mg by mouth daily. On week 2 = 40 mg Q D for 2 weeks started on 08-24-19   Toviaz 8 MG Tb24 tablet Generic drug: fesoterodine Take 8 mg by mouth daily.       Discharge Instructions: Please refer to Patient Instructions section of EMR for full details.  Patient was counseled important signs and symptoms that should prompt return to medical care, changes in medications, dietary instructions, activity restrictions, and follow up appointments.   Follow-Up Appointments: Follow-up Information    [facility] Follow up.   Specialty: Home Health Services Why: to resume [facility] services. they will call you in a few days to set up yur next home appointment Contact information: [address] [place] [id number] [phone]        [facility] Follow up.   Why: call to establish with a primary care provider Contact information: [phone]          Gladys Damme, MD 08/31/2019, 7:40 PM PGY-1, [place]

## 2019-08-27 NOTE — Progress Notes (Signed)
Family Medicine Teaching Service Daily Progress Note Intern Pager: [phone]  Patient name: Tracey Riddle Medical record number: [id number] Date of birth: 05/28/1951 Age: 69 y.o. Gender: female  Primary Care Provider: System, Pcp Not In Consultants: None Code Status: Full  Pt Overview and Major Events to Date:  3/10 admitted for dehydration  Assessment and Plan: Tracey Riddle is a 69 y.o. female presenting with diarrhea, SOB. PMH is significant for CKD, DMT2, H/O TIA.  Questionable Sepsis  Likely viral gastroenteritis Patient feels much better today, no diarrhea, emesis, or nausea since yesterday. Na improved to 131. Ur Na within normal limits. Patient tolerated PO last night without issue, IVF discontinued. C. Difficile negative. GIPP pending. Blood cx no growtn at <24 hours. Urine culture pending. Because patient is feeling better, no longer requires IVF, tolerating PO, plan to discharge today. Will call niece and discuss with her. Prednisone tx with unclear history and unclear instructions. Pharmacy will help with finding prescriber so that we can clarify dosing instructions. -Vitals per floor routine  -F/u Blood cx, urine cx -F/u GIpp -F/u FOBT -continue prednisone 20mg  daily and continue taper             - attempt to obtain notes from PCP to assess the need for steroids and further medical history             -clarify dosing instructions with prescriber -Lovenox for DVT ppx -Carb modified diet, encourage PO intake as tolerated  Transaminitis  CT w/ nodular hepatic contour Patient presented with ALT mildly elevated to 46. Total bilirubin, alkaline phosphatase and AST WNL. No known history of cirrhosis, but history is limited to family member's knowledge. CT with nodular hepatic contour, raising the possibility of mild cirrhosis. Patient does not drink alcohol, originally from [place]. Also possibly elevated due to viral infection. No medical history available in Care Everywhere;  treated mostly at [facility] in [place] up until several months ago when she moved to [place]. Hepatitis a negative, Hep B surface ag negative. ALT improved to WNL today, 30. Recommend outpatient follow up. -f/u outstanding hepatic labs -recommend outpatient follow up  Hyponatremia-improved In setting of elevated glucose to 280, Na 128 corrects to 131. Serum osmolality normal 283 pointing to pseudohyponatremia. Lipid panel.  Most likely due extrarenal losses in setting of diarrhea, vomiting. Ur Na WNL. Na today 131. Lipid panel WNL: Cholesterol 95, HDL 32, LDL 44, triglycerides 95.  Reported Kidney Dysfunction- normal creatinine Patient on home lasix 20mg  dose. Cr today 0.83, GFR >60. She did have sudden onset kidney function decline that occurred during admission to [facility] post-COVID in November-December. Cr continued WNL today 0.75. No utility that can be appreciated by our team for lasix, compression stockings more appropriate for LE edema. Will recommend d/c lasix. BNP WNL at 49.7. -Daily CMP (see above)  Questionable melena- Patient is not anemic, but reports black diarrheal stools. She reports that she had colonoscopy that was normal, not sure when it was; no family history of colon cancer. -refer to GI for outpatient colonoscopy  DMT2 Patient diagnosed this year. Home medications include lispro 5-10U before meals. Hgb A1c 8. Patient has not been on metformin that she can remember. Will not recommend it now since she has had GI upset recently, but should discuss with PCP. CBG ranged from 317 overnight down to 196 with Aspart 11U given. Patient also on prednisone 40. - CBG monitored before meals and QHS. -carb modified diet -SSI  H/O TIA- lipid panel wnl  except HDL 32 Patient reportedly functional at baseline, walks with walker, works with home PT. No focal deficits appreciated on exam. Not on statin. Currently lipids well controlled and not on statin (see above). Should be on  antiplatelet therapy, will recommend follow up with PCP. -follow up with PCP re anti-platelet therapy  FEN/GI: carb modified PPx: lovenox  Disposition: to home today  Subjective:  Pt feeling better today, no nausea, emesis, or diarrhea since admission. Abdominal pain resolved.  Objective: Temp:  [97.9 F (36.6 C)-98.4 F (36.9 C)] 98.1 F (36.7 C) (03/10 2329) Pulse Rate:  [41-123] 100 (03/10 2329) Resp:  [15-22] 18 (03/10 2329) BP: (102-132)/(56-99) 116/72 (03/10 2329) SpO2:  [97 %-100 %] 99 % (03/10 2329) Weight:  [87.9 kg-89.9 kg] 87.9 kg (03/11 0252) Physical Exam: General: older African woman sitting up comfortably on the edge of the bed, NAD Cardiovascular: RRR, no m/r/g Respiratory: CTAB, no increased WOB, no wheezes, rales, rhonchi Abdomen: soft, NT, ND, normal bowel sounds + Extremities: +1 pitting edema to knee bilaterally  Laboratory: Recent Labs  Lab 08/26/19 0836  WBC 8.8  HGB 14.2  HCT 41.6  PLT 164   Recent Labs  Lab 08/26/19 0836  NA 128*  K 3.5  CL 97*  CO2 20*  BUN 14  CREATININE 0.83  CALCIUM 7.9*  PROT 6.1*  BILITOT 1.2  ALKPHOS 73  ALT 46*  AST 34  GLUCOSE 280*   Imaging/Diagnostic Tests: CT ABDOMEN PELVIS W CONTRAST  Result Date: 08/26/2019 CLINICAL DATA:  Lower abdominal pain, nausea/vomiting EXAM: CT ABDOMEN AND PELVIS WITH CONTRAST TECHNIQUE: Multidetector CT imaging of the abdomen and pelvis was performed using the standard protocol following bolus administration of intravenous contrast. CONTRAST:  OMNIPAQUE IOHEXOL 300 MG/ML  SOLN COMPARISON:  None. FINDINGS: Motion degraded images. Lower chest: Mild dependent atelectasis in the bilateral lower lobes. Hepatobiliary: Mildly nodular hepatic contour, raising the possibility of mild cirrhosis. No suspicious/enhancing hepatic lesions. Gallbladder is unremarkable. No intrahepatic or extrahepatic ductal dilatation. Pancreas: Within normal limits. Spleen: The normal limits.  Adrenals/Urinary Tract: Adrenal glands are within normal limits. 10 mm left upper pole renal cyst (series 3/image 26). Right kidney is within normal limits. No hydronephrosis. Bladder is within normal limits. Stomach/Bowel: Stomach is within normal limits. No evidence of bowel obstruction. Normal appendix (series 3/image 50). No colonic wall thickening or inflammatory changes. Vascular/Lymphatic: No evidence of abdominal aortic aneurysm. Atherosclerotic calcifications of the abdominal aorta and branch vessels. No suspicious abdominopelvic lymphadenopathy. Reproductive: Uterus is within normal limits. Left ovary is within normal limits.  No right adnexal mass. Other: No abdominopelvic ascites. Musculoskeletal: Mild degenerative changes at L5-S1. IMPRESSION: Motion degraded images. No evidence of bowel obstruction.  Normal appendix. No CT findings to account for the patient's lower abdominal pain. Mildly nodular hepatic contour, raising the possibility of mild cirrhosis. Electronically Signed   By: Charline Bills M.D.   On: 08/26/2019 11:40   Shirlean Mylar, MD 08/27/2019, 6:40 AM PGY-1, [facility] Family Medicine FPTS Intern pager: [phone], text pages welcome

## 2019-08-27 NOTE — Progress Notes (Signed)
FPTS Interim Progress Note  Patient blood culture positive for E coli and Pseudomonas.  Will order cefepime per pharmacy consult after discussion with pharmacy.  Will also order repeat blood cultures.  Tracey Riddle, Solmon Ice, DO 08/27/2019, 3:44 PM PGY-2, [facility] Family Medicine Service pager [phone]

## 2019-08-28 ENCOUNTER — Other Ambulatory Visit: Payer: Self-pay

## 2019-08-28 ENCOUNTER — Encounter (HOSPITAL_COMMUNITY): Payer: Self-pay | Admitting: Family Medicine

## 2019-08-28 LAB — GLUCOSE, CAPILLARY
Glucose-Capillary: 178 mg/dL — ABNORMAL HIGH (ref 70–99)
Glucose-Capillary: 176 mg/dL — ABNORMAL HIGH (ref 70–99)
Glucose-Capillary: 291 mg/dL — ABNORMAL HIGH (ref 70–99)
Glucose-Capillary: 208 mg/dL — ABNORMAL HIGH (ref 70–99)

## 2019-08-28 LAB — CBC
HCT: 33.8 % — ABNORMAL LOW (ref 36.0–46.0)
Hemoglobin: 11.5 g/dL — ABNORMAL LOW (ref 12.0–15.0)
MCH: 33.1 pg (ref 26.0–34.0)
MCHC: 34 g/dL (ref 30.0–36.0)
MCV: 97.4 fL (ref 80.0–100.0)
Platelets: 130 10*3/uL — ABNORMAL LOW (ref 150–400)
RBC: 3.47 MIL/uL — ABNORMAL LOW (ref 3.87–5.11)
RDW: 12.4 % (ref 11.5–15.5)
WBC: 5.1 10*3/uL (ref 4.0–10.5)
nRBC: 0.6 % — ABNORMAL HIGH (ref 0.0–0.2)

## 2019-08-28 LAB — BASIC METABOLIC PANEL
Anion gap: 10 (ref 5–15)
BUN: 10 mg/dL (ref 8–23)
CO2: 22 mmol/L (ref 22–32)
Calcium: 8.2 mg/dL — ABNORMAL LOW (ref 8.9–10.3)
Chloride: 99 mmol/L (ref 98–111)
Creatinine, Ser: 0.82 mg/dL (ref 0.44–1.00)
GFR calc Af Amer: >60 mL/min (ref >60)
GFR calc non Af Amer: >60 mL/min (ref >60)
Glucose, Bld: 153 mg/dL — ABNORMAL HIGH (ref 70–99)
Potassium: 4 mmol/L (ref 3.5–5.1)
Sodium: 131 mmol/L — ABNORMAL LOW (ref 135–145)

## 2019-08-28 LAB — URINE CULTURE: Culture: >=100000 — AB

## 2019-08-28 LAB — PROTIME-INR
INR: 1.2 (ref 0.8–1.2)
Prothrombin Time: 14.9 seconds (ref 11.4–15.2)

## 2019-08-28 LAB — HEPATITIS B SURFACE ANTIBODY, QUANTITATIVE: Hep B S AB Quant (Post): <3.1 m[IU]/mL — ABNORMAL LOW (ref >9.9)

## 2019-08-28 LAB — APTT: aPTT: 31 seconds (ref 24–36)

## 2019-08-28 LAB — CK: Total CK: 20 U/L — ABNORMAL LOW (ref 38–234)

## 2019-08-28 MED ORDER — SODIUM CHLORIDE 0.9 % IV SOLN
INTRAVENOUS | Status: DC | PRN
  Administered 2019-08-28: 1000 mL via INTRAVENOUS

## 2019-08-28 MED ORDER — SODIUM CHLORIDE 0.9 % IV SOLN: INTRAVENOUS | Status: DC

## 2019-08-28 MED ORDER — SODIUM CHLORIDE 0.9 % IV SOLN
1.0000 g | Freq: Three times a day (TID) | INTRAVENOUS | Status: DC
  Administered 2019-08-28 – 2019-08-30 (×7): 1 g via INTRAVENOUS
  Filled 2019-08-28 (×9): qty 1

## 2019-08-28 MED ORDER — INSULIN ASPART 100 UNIT/ML ~~LOC~~ SOLN
0.0000 [IU] | Freq: Three times a day (TID) | SUBCUTANEOUS | Status: DC
  Administered 2019-08-28: 3 [IU] via SUBCUTANEOUS
  Administered 2019-08-28: 8 [IU] via SUBCUTANEOUS
  Administered 2019-08-29 (×2): 2 [IU] via SUBCUTANEOUS
  Administered 2019-08-29: 8 [IU] via SUBCUTANEOUS
  Administered 2019-08-30: 2 [IU] via SUBCUTANEOUS

## 2019-08-28 MED ORDER — ATORVASTATIN CALCIUM 80 MG PO TABS
80.0000 mg | ORAL_TABLET | Freq: Every day | ORAL | Status: DC
  Administered 2019-08-28 – 2019-08-29 (×2): 80 mg via ORAL
  Filled 2019-08-28 (×2): qty 1

## 2019-08-28 MED ORDER — ASPIRIN EC 81 MG PO TBEC
81.0000 mg | DELAYED_RELEASE_TABLET | Freq: Every day | ORAL | Status: DC
  Administered 2019-08-28 – 2019-08-30 (×3): 81 mg via ORAL
  Filled 2019-08-28 (×3): qty 1

## 2019-08-28 NOTE — Progress Notes (Signed)
Pharmacy Antibiotic Note  Tracey Riddle is a 69 y.o. female with is significant forCKD, DMT2, H/O TIA admitted on 08/26/2019 with shortness of breath and diarrhea. She was found to have bacteremia.  Pharmacy has been consulted for meropenem dosing.  Renal function is stable with CrCl >60. Vital signs are stable. Patient is currently afebrile and WBC is wnls.  Plan: Stop cefepime Start meropenem 1g q8h IV Watch clinical status, renal function, WBC, temp, and cultures F/U on length of therapy.  Height: 5\' 3"  (160 cm) Weight: 189 lb 13.1 oz (86.1 kg) IBW/kg (Calculated) : 52.4  Temp (24hrs), Avg:98.2 F (36.8 C), Min:97.9 F (36.6 C), Max:98.5 F (36.9 C)  Recent Labs  Lab 08/26/19 0836 08/26/19 0858 08/26/19 1103 08/27/19 0533 08/28/19 0144  WBC 8.8  --   --  4.8 5.1  CREATININE 0.83  --   --  0.75 0.82  LATICACIDVEN  --  2.0* 1.4  --   --     Estimated Creatinine Clearance: 65.5 mL/min (by C-G formula based on SCr of 0.82 mg/dL).    No Known Allergies  Antimicrobials this admission: Cefepime 3/11>> 3/12 Meropenem 3/12>>   Microbiology results: 3/10 BCx: E.coli and Pseudomonas. Susceptibilities pending 3/10 UCx: > 100,000 ESBL E.coli 3/10 MRSA PCR: Negative 3/11 C Diff: negative 3/11 Bcx: gram negative rods  Thank you for allowing pharmacy to be a part of this patient's care.  5/11, PharmD PGY1 Acute Care Pharmacy Resident 08/28/2019 1:06 PM

## 2019-08-28 NOTE — Progress Notes (Addendum)
Results for KERA, DEACON (MRN [id number]) as of 08/28/2019 08:34  Ref. Range 08/27/2019 06:00 08/27/2019 11:35 08/27/2019 16:20 08/27/2019 20:46 08/28/2019 06:27  Glucose-Capillary Latest Ref Range: 70 - 99 mg/dL 022 (H) 336 (H) 122 (H) 257 (H) 178 (H)  Noted that postprandial blood sugars have been greater than 180 mg/dl.   Recommend adding Novolog 3 units TID as meal coverage if patient eats at least 50% of meal and if blood sugars continue to be elevated.   Smith Mince RN BSN CDE Diabetes Coordinator Pager: [phone]  8am-5pm

## 2019-08-28 NOTE — Evaluation (Signed)
Physical Therapy Evaluation Patient Details Name: Tracey Riddle MRN: [id number] DOB: 09-02-1950 Today's Date: 08/28/2019   History of Present Illness  69 y.o. female presenting with diarrhea, SOB. PMH is significant for CKD, DMT2, H/O TIA. Concern for sepsis 2/2 viral gastroenteritis. Pt prefers language other than English, wanting niece to interpret session.  Clinical Impression  Pt demonstrates deficits in functional mobility, gait, balance, endurance, strength, power. Pt is generally weak and labored during all mobility this session. Pt with strong family support, and patient and family requesting to return home with continued home health PT. Pt will benefit from ambulation out of the room 3 times daily to progress activity tolerance and mobility quality.    Follow Up Recommendations Home health PT;Supervision for mobility/OOB    Equipment Recommendations  3in1 (PT)(if pt has not received one since admission date)    Recommendations for Other Services       Precautions / Restrictions Precautions Precautions: Fall Restrictions Weight Bearing Restrictions: No      Mobility  Bed Mobility Overal bed mobility: Needs Assistance Bed Mobility: Supine to Sit     Supine to sit: Supervision        Transfers Overall transfer level: Needs assistance Equipment used: Rolling walker (2 wheeled) Transfers: Sit to/from Stand Sit to Stand: Min assist            Ambulation/Gait Ambulation/Gait assistance: Min guard Gait Distance (Feet): 40 Feet Assistive device: Rolling walker (2 wheeled) Gait Pattern/deviations: Step-to pattern Gait velocity: reduced Gait velocity interpretation: <1.8 ft/sec, indicate of risk for recurrent falls General Gait Details: pt with slowed step to gait, reduced gait speed, appears labored  Stairs            Wheelchair Mobility    Modified Rankin (Stroke Patients Only)       Balance Overall balance assessment: Needs  assistance Sitting-balance support: Single extremity supported;Feet supported Sitting balance-Leahy Scale: Good Sitting balance - Comments: supervision   Standing balance support: Bilateral upper extremity supported Standing balance-Leahy Scale: Fair Standing balance comment: minG with BUE support of RW                             Pertinent Vitals/Pain Pain Assessment: No/denies pain    Home Living Family/patient expects to be discharged to:: Private residence Living Arrangements: Other relatives(sister, niece) Available Help at Discharge: Family;Available 24 hours/day Type of Home: House Home Access: Stairs to enter Entrance Stairs-Rails: Right Entrance Stairs-Number of Steps: 3 Home Layout: Two level;Able to live on main level with bedroom/bathroom Home Equipment: [place] - 2 wheels;Shower seat(possible bedside commode, having one ordered prior to admiss)      Prior Function Level of Independence: Needs assistance   Gait / Transfers Assistance Needed: Ambulates with RW and supervision of niece for household distances, continuing rehab after SNF stay           Hand Dominance        Extremity/Trunk Assessment   Upper Extremity Assessment Upper Extremity Assessment: Generalized weakness    Lower Extremity Assessment Lower Extremity Assessment: Generalized weakness    Cervical / Trunk Assessment Cervical / Trunk Assessment: Normal  Communication   Communication: Prefers language other than English(niece interpreting during session)  Cognition Arousal/Alertness: Awake/alert Behavior During Therapy: Flat affect Overall Cognitive Status: Within Functional Limits for tasks assessed  General Comments General comments (skin integrity, edema, etc.): VSS on RA    Exercises     Assessment/Plan    PT Assessment Patient needs continued PT services  PT Problem List Decreased strength;Decreased  activity tolerance;Decreased balance;Decreased mobility;Decreased knowledge of use of DME;Decreased safety awareness       PT Treatment Interventions DME instruction;Gait training;Stair training;Functional mobility training;Therapeutic activities;Therapeutic exercise;Balance training;Neuromuscular re-education;Patient/family education    PT Goals (Current goals can be found in the Care Plan section)  Acute Rehab PT Goals Patient Stated Goal: To return to independent mobility and go home PT Goal Formulation: With patient/family Time For Goal Achievement: 09/11/19 Potential to Achieve Goals: Good    Frequency Min 3X/week   Barriers to discharge        Co-evaluation               AM-PAC PT "6 Clicks" Mobility  Outcome Measure Help needed turning from your back to your side while in a flat bed without using bedrails?: A Little Help needed moving from lying on your back to sitting on the side of a flat bed without using bedrails?: A Little Help needed moving to and from a bed to a chair (including a wheelchair)?: A Little Help needed standing up from a chair using your arms (e.g., wheelchair or bedside chair)?: A Little Help needed to walk in hospital room?: A Little Help needed climbing 3-5 steps with a railing? : A Lot 6 Click Score: 17    End of Session   Activity Tolerance: Patient tolerated treatment well Patient left: in chair;with call bell/phone within reach;with family/visitor present Nurse Communication: Mobility status PT Visit Diagnosis: Muscle weakness (generalized) (M62.81)    Time: 2878-6767 PT Time Calculation (min) (ACUTE ONLY): 30 min   Charges:   PT Evaluation $PT Eval Moderate Complexity: 1 Mod PT Treatments $Gait Training: 8-22 mins        Zenaida Niece, PT, DPT Acute Rehabilitation Pager: [phone]   Zenaida Niece 08/28/2019, 2:35 PM

## 2019-08-28 NOTE — Progress Notes (Addendum)
Family Medicine Teaching Service Daily Progress Note Intern Pager: [phone]  Patient name: Tracey Riddle Medical record number: [id number] Date of birth: [birth year] Age: 69 y.o. Gender: female  Primary Care Provider: System, Pcp Not In Consultants: None Code Status: Full  Pt Overview and Major Events to Date:  3/10 admitted for dehydration 3/11 blood cx (+) E. Coli, pseudomonas 3/12 urine cx (+) E. Coli ESBL, meropenem started  Assessment and Plan: Tracey Riddle is a 69 y.o. female presenting with diarrhea, SOB. PMH is significant for CKD, DMT2, H/O TIA.  Sepsis due to E. Coli and Pseudomonas bacteremia Patient has some mild lower abdominal pain today, no further episodes of diarrhea. Blood culture from admission returned with e coli and pseudomonas bacteremia yesterday. Urine culture (+) with e coli with ESBL production, carbapenems preferred. IV cefepime started yesterday, will discontinue and start meropenem today. Repeat blood culture from yesterday with GNR in 1/2 bottles. Patient is afebrile, currently VSS, some mild tachycardia to 103, normotensive. Will continue to monitor today. Not sure what prednisone taper is from, requested records from [facility] yesterday. Spoke with pt's niece and she was in a SNF as recently as 2 weeks ago, which is likely how she came by these infections. -Vitals per floor routine  -F/u Blood cx -Start meropenem -continue prednisone 20mg  daily and continue taper             - attempt to obtain notes from PCP to assess the need for steroids and further medical history -Lovenox for DVT ppx -Carb modified diet, encourage PO intake as tolerated  Hyponatremia-improved Improved to 131 today. Most likely due extrarenal losses in setting of diarrhea, vomiting. Ur Na WNL. Lipid panel WNL: Cholesterol 95, HDL 32, LDL 44, triglycerides 95. -NS @ 79mL/hr today -Daily BMP  Transaminitis  CT w/ nodular hepatic contour -improved Patient presented with ALT  mildly elevated to 46. Total bilirubin, alkaline phosphatase and AST WNL. No known history of cirrhosis, but history is limited to family member's knowledge. CT with nodular hepatic contour, raising the possibility of mild cirrhosis. Patient does not drink alcohol, originally from [place]. No medical history available in Care Everywhere; treated mostly at [facility] in [place] up until several months ago when she moved to [place]. Hepatitis a negative, Hep B surface ag negative. ALT improved to WNL yesterday, 30. Recommend outpatient follow up. -f/u outstanding hepatic labs -recommend outpatient follow up  Reported Kidney Dysfunction- normal creatinine Patient on home lasix 20mg  dose. Cr on admission 0.83, GFR >60. She did have sudden onset kidney function decline that occurred during admission to [facility] post-COVID in November-December. Cr continued WNL today 0.82. No utility that can be appreciated by our team for lasix, compression stockings more appropriate for LE edema.  BNP WNL at 49.7. -Daily BMP (see above) -IVF see above -Discontinue lasix  Questionable melena- Patient is not anemic, but reports black diarrheal stools. She reports that she had colonoscopy that was normal, not sure when it was; no family history of colon cancer. -refer to GI for outpatient colonoscopy  DMT2 Patient diagnosed this year. Home medications include lispro 5-10U before meals. Hgb A1c 8. CBG ranged from [phone] with Aspart 15U given. Patient also on prednisone 40, clarifying taper and use with records from [facility]. Consider initiating metformin today. - CBG monitored before meals and QHS. -carb modified diet -SSI increased to moderate  H/O TIA- lipid panel wnl except HDL 32 Patient reportedly functional at baseline, walks with walker, works with home  PT. No focal deficits appreciated on exam. Currently lipids well controlled, discovered pt on atorvastatin 80mg . Should be on antiplatelet therapy, will  start ASA 81 mg. -ASA 81 mg -restart statin therapy: atorvastatin 80 mg  FEN/GI: carb modified PPx: lovenox  Disposition: med-surg pending clearance of bacteremia, then home with nieces   Subjective:  Some abdominal pain today, but overal pt feels better than admission. No diarrhea.  Objective: Temp:  [97.9 F (36.6 C)-98.3 F (36.8 C)] 97.9 F (36.6 C) (03/11 1913) Pulse Rate:  [76-103] 103 (03/11 1913) Resp:  [17-18] 17 (03/11 1913) BP: (119-147)/(65-91) 132/74 (03/11 1913) SpO2:  [100 %] 100 % (03/11 1913) Weight:  [86.1 kg] 86.1 kg (03/12 0441) Physical Exam: General: older African woman laying comfortably in bed, NAD Cardiovascular: RRR, no m/r/g Respiratory: CTAB, no increased WOB, no wheezes, rales, rhonchi Abdomen: soft, tender in lower quadrants, no guarding, ND, normal bowel sounds + Extremities: +1 pitting edema to mid shin bilaterally  Laboratory: Recent Labs  Lab 08/26/19 0836 08/27/19 0533 08/28/19 0144  WBC 8.8 4.8 5.1  HGB 14.2 10.9* 11.5*  HCT 41.6 31.9* 33.8*  PLT 164 109* 130*   Recent Labs  Lab 08/26/19 0836 08/27/19 0533 08/28/19 0144  NA 128* 131* 131*  K 3.5 4.0 4.0  CL 97* 100 99  CO2 20* 21* 22  BUN 14 10 10   CREATININE 0.83 0.75 0.82  CALCIUM 7.9* 7.7* 8.2*  PROT 6.1* 4.9*  --   BILITOT 1.2 0.9  --   ALKPHOS 73 57  --   ALT 46* 30  --   AST 34 15  --   GLUCOSE 280* 196* 153*   Imaging/Diagnostic Tests: No results found. Tracey Damme, MD 08/28/2019, 6:16 AM PGY-1, [facility] Intern pager: [phone], text pages welcome

## 2019-08-29 ENCOUNTER — Encounter (HOSPITAL_COMMUNITY): Payer: Self-pay | Admitting: Family Medicine

## 2019-08-29 LAB — BASIC METABOLIC PANEL
Anion gap: 9 (ref 5–15)
BUN: 7 mg/dL — ABNORMAL LOW (ref 8–23)
CO2: 21 mmol/L — ABNORMAL LOW (ref 22–32)
Calcium: 7.8 mg/dL — ABNORMAL LOW (ref 8.9–10.3)
Chloride: 102 mmol/L (ref 98–111)
Creatinine, Ser: 0.74 mg/dL (ref 0.44–1.00)
GFR calc Af Amer: >60 mL/min (ref >60)
GFR calc non Af Amer: >60 mL/min (ref >60)
Glucose, Bld: 123 mg/dL — ABNORMAL HIGH (ref 70–99)
Potassium: 3.1 mmol/L — ABNORMAL LOW (ref 3.5–5.1)
Sodium: 132 mmol/L — ABNORMAL LOW (ref 135–145)

## 2019-08-29 LAB — CULTURE, BLOOD (ROUTINE X 2)

## 2019-08-29 LAB — GLUCOSE, CAPILLARY
Glucose-Capillary: 144 mg/dL — ABNORMAL HIGH (ref 70–99)
Glucose-Capillary: 129 mg/dL — ABNORMAL HIGH (ref 70–99)
Glucose-Capillary: 260 mg/dL — ABNORMAL HIGH (ref 70–99)
Glucose-Capillary: 144 mg/dL — ABNORMAL HIGH (ref 70–99)

## 2019-08-29 LAB — CBC
HCT: 31.8 % — ABNORMAL LOW (ref 36.0–46.0)
Hemoglobin: 11.1 g/dL — ABNORMAL LOW (ref 12.0–15.0)
MCH: 33.5 pg (ref 26.0–34.0)
MCHC: 34.9 g/dL (ref 30.0–36.0)
MCV: 96.1 fL (ref 80.0–100.0)
Platelets: 125 10*3/uL — ABNORMAL LOW (ref 150–400)
RBC: 3.31 MIL/uL — ABNORMAL LOW (ref 3.87–5.11)
RDW: 12.5 % (ref 11.5–15.5)
WBC: 4.5 10*3/uL (ref 4.0–10.5)
nRBC: 0.4 % — ABNORMAL HIGH (ref 0.0–0.2)

## 2019-08-29 MED ORDER — POLYETHYLENE GLYCOL 3350 17 G PO PACK
17.0000 g | PACK | Freq: Every day | ORAL | Status: DC
  Administered 2019-08-30: 17 g via ORAL
  Filled 2019-08-29: qty 1

## 2019-08-29 MED ORDER — POTASSIUM CHLORIDE CRYS ER 20 MEQ PO TBCR
40.0000 meq | EXTENDED_RELEASE_TABLET | Freq: Two times a day (BID) | ORAL | Status: AC
  Administered 2019-08-29 (×2): 40 meq via ORAL
  Filled 2019-08-29 (×2): qty 2

## 2019-08-29 MED ORDER — ATORVASTATIN CALCIUM 40 MG PO TABS: 40.0000 mg | ORAL_TABLET | Freq: Every day | ORAL | Status: DC

## 2019-08-29 NOTE — Progress Notes (Signed)
Family Medicine Teaching Service Daily Progress Note Intern Pager: [phone]  Patient name: Tracey Riddle Medical record number: [id number] Date of birth: 1950/10/14 Age: 69 y.o. Gender: female  Primary Care Provider: System, Pcp Not In Consultants: None Code Status: Full  Pt Overview and Major Events to Date:  3/10 admitted for dehydration 3/11 blood cx (+) E. Coli, pseudomonas, cefepime tx 3/12 urine cx (+) E. Coli ESBL, meropenem started  Assessment and Plan: Tracey Riddle is a 69 y.o. female presenting with diarrhea, SOB. PMH is significant for CKD, DMT2, H/O TIA.  Sepsis due to E. Coli and Pseudomonas bacteremia Patient has some mild lower abdominal pain today, no further episodes of diarrhea. Blood culture from admission returned with e coli and pseudomonas bacteremia. Urine culture (+) with e coli with ESBL production, carbapenems preferred. IV meropenem started 3/12. Repeat blood culture collected today, results pending. Patient is afebrile with stable vital signs. Still attempting to obtain records from [facility] to elucidate prednisone taper. -Vitals per floor routine  -F/u Blood cx -Continue meropenem -IVF NS @ 75 mL/hr -continue prednisone 20mg  daily and continue taper             - attempt to obtain notes from PCP to assess the need for steroids and further medical history -Lovenox for DVT ppx -Carb modified diet, encourage PO intake as tolerated  Hyponatremia-improved Improved to 131 today. Most likely due extrarenal losses in setting of diarrhea, vomiting. Ur Na WNL. Lipid panel WNL: Cholesterol 95, HDL 32, LDL 44, triglycerides 95. -NS @ 26mL/hr today -Daily BMP  Transaminitis  CT w/ nodular hepatic contour -improved Patient presented with ALT mildly elevated to 46. Total bilirubin, alkaline phosphatase and AST WNL. No known history of cirrhosis, but history is limited to family member's knowledge. CT with nodular hepatic contour, raising the possibility of mild  cirrhosis. Patient does not drink alcohol, originally from 70[place]. No medical history available in Care Everywhere; treated mostly at [facility] in [place] up until several months ago when she moved to [place]. Hepatitis a negative, Hep B surface ag negative. ALT improved to WNL yesterday, 30. Recommend outpatient follow up. -f/u outstanding hepatic labs -recommend outpatient follow up  Reported Kidney Dysfunction- normal creatinine Patient on home lasix 20mg  dose. Cr on admission 0.83, GFR >60. She did have sudden onset kidney function decline that occurred during admission to [facility] post-COVID in November-December. Cr continued WNL today 0.82. No utility that can be appreciated by our team for lasix, compression stockings more appropriate for LE edema.  BNP WNL at 49.7. -Daily BMP (see above) -IVF see above -Discontinue lasix  Questionable melena- Patient is not anemic, but reports black diarrheal stools. She reports that she had colonoscopy that was normal, not sure when it was; no family history of colon cancer. -refer to GI for outpatient colonoscopy  DMT2 Patient diagnosed this year. Home medications include lispro 5-10U before meals. Hgb A1c 8. CBG ranged from [phone] with Aspart 15U given. Patient also on prednisone 40, clarifying taper and use with records from [facility].  - CBG monitored before meals and QHS. -carb modified diet -SSI moderate  H/O TIA- lipid panel wnl except HDL 32 Patient reportedly functional at baseline, walks with walker, works with home PT. No focal deficits appreciated on exam. Currently lipids well controlled, discovered pt on atorvastatin 80mg . Should be on antiplatelet therapy, will start ASA 81 mg. -ASA 81 mg -restart statin therapy: atorvastatin 80 mg  FEN/GI: carb modified PPx: lovenox  Disposition:  med-surg pending clearance of bacteremia, then home with nieces   Subjective:  Some abdominal pain today, but overal pt feels better than  admission. No diarrhea.  Objective: Temp:  [98.4 F (36.9 C)-98.6 F (37 C)] 98.6 F (37 C) (03/12 2300) Pulse Rate:  [76-98] 88 (03/13 0310) Resp:  [14-16] 14 (03/12 2300) BP: (111-125)/(71-76) 117/71 (03/13 0310) SpO2:  [97 %-99 %] 99 % (03/12 2300) Weight:  [86.5 kg] 86.5 kg (03/13 0310) Physical Exam: General: older African woman laying comfortably in bed, NAD Cardiovascular: RRR, no m/r/g Respiratory: CTAB, no increased WOB, no wheezes, rales, rhonchi Abdomen: soft, tender in lower quadrants, no guarding, ND, normal bowel sounds + Extremities: +1 pitting edema to mid shin bilaterally  Laboratory: Recent Labs  Lab 08/26/19 0836 08/27/19 0533 08/28/19 0144  WBC 8.8 4.8 5.1  HGB 14.2 10.9* 11.5*  HCT 41.6 31.9* 33.8*  PLT 164 109* 130*   Recent Labs  Lab 08/26/19 0836 08/27/19 0533 08/28/19 0144  NA 128* 131* 131*  K 3.5 4.0 4.0  CL 97* 100 99  CO2 20* 21* 22  BUN 14 10 10   CREATININE 0.83 0.75 0.82  CALCIUM 7.9* 7.7* 8.2*  PROT 6.1* 4.9*  --   BILITOT 1.2 0.9  --   ALKPHOS 73 57  --   ALT 46* 30  --   AST 34 15  --   GLUCOSE 280* 196* 153*   Imaging/Diagnostic Tests: No results found. Tracey Damme, MD 08/29/2019, 8:31 AM PGY-1, [place] Intern pager: [phone], text pages welcome

## 2019-08-29 NOTE — Plan of Care (Signed)

## 2019-08-29 NOTE — Progress Notes (Signed)
FPTS Interim Progress Note  Went to patient's room because received information that niece had brought paperwork from last admission. Reviewed discharge paperwork from [facility] on 08/07/19, which listed diagnosis codes for acute renal failure, rhabdomyolysis, myositis, and rheumatoid arthritis. These diagnoses make more sense in light of murky history regarding limb weakness, reported renal problem, and a biopsy of the thigh. RA/myositis is likely what the prednisone treatment is for.   Requested records from [facility] on 3/12, however we did not receive them. I asked the niece to also request records and provide them to [place] on Monday if she can retrieve them.  Niece also reported that patient had lipitor discontinued at last admission because she believe it caused the renal failure. No known adverse events from lipitor involving kidneys, but because pt just had lipitor discontinued and was previously having muscle complaints (none so far this admission), I will decrease lipitor from 80mg  to 40 mg. If any muscle complaints occur, I will discontinue lipitor altogether.  , MD 08/29/2019, 5:30 PM PGY-1, [facility] Family Medicine Service pager [phone]

## 2019-08-30 LAB — COMPREHENSIVE METABOLIC PANEL
ALT: 35 U/L (ref 0–44)
AST: 28 U/L (ref 15–41)
Albumin: 2.2 g/dL — ABNORMAL LOW (ref 3.5–5.0)
Alkaline Phosphatase: 73 U/L (ref 38–126)
Anion gap: 10 (ref 5–15)
BUN: 8 mg/dL (ref 8–23)
CO2: 18 mmol/L — ABNORMAL LOW (ref 22–32)
Calcium: 7.9 mg/dL — ABNORMAL LOW (ref 8.9–10.3)
Chloride: 101 mmol/L (ref 98–111)
Creatinine, Ser: 0.7 mg/dL (ref 0.44–1.00)
GFR calc Af Amer: >60 mL/min (ref >60)
GFR calc non Af Amer: >60 mL/min (ref >60)
Glucose, Bld: 201 mg/dL — ABNORMAL HIGH (ref 70–99)
Potassium: 4.5 mmol/L (ref 3.5–5.1)
Sodium: 129 mmol/L — ABNORMAL LOW (ref 135–145)
Total Bilirubin: 1.4 mg/dL — ABNORMAL HIGH (ref 0.3–1.2)
Total Protein: 6 g/dL — ABNORMAL LOW (ref 6.5–8.1)

## 2019-08-30 LAB — CBC
HCT: 34.5 % — ABNORMAL LOW (ref 36.0–46.0)
Hemoglobin: 11.8 g/dL — ABNORMAL LOW (ref 12.0–15.0)
MCH: 33.2 pg (ref 26.0–34.0)
MCHC: 34.2 g/dL (ref 30.0–36.0)
MCV: 97.2 fL (ref 80.0–100.0)
Platelets: 140 10*3/uL — ABNORMAL LOW (ref 150–400)
RBC: 3.55 MIL/uL — ABNORMAL LOW (ref 3.87–5.11)
RDW: 12.6 % (ref 11.5–15.5)
WBC: 6.1 10*3/uL (ref 4.0–10.5)
nRBC: 0.5 % — ABNORMAL HIGH (ref 0.0–0.2)

## 2019-08-30 LAB — GI PATHOGEN PANEL BY PCR, STOOL
Adenovirus F 40/41: NOT DETECTED
Astrovirus: NOT DETECTED
Campylobacter by PCR: NOT DETECTED
Cryptosporidium by PCR: NOT DETECTED
Cyclospora cayetanensis: NOT DETECTED
E coli (ETEC) LT/ST: NOT DETECTED
E coli (STEC): NOT DETECTED
Entamoeba histolytica: NOT DETECTED
Enteroaggregative E coli: NOT DETECTED
Enteropathogenic E coli: NOT DETECTED
G lamblia by PCR: NOT DETECTED
Norovirus GI/GII: NOT DETECTED
Plesiomonas shigelloides: NOT DETECTED
Rotavirus A by PCR: DETECTED — AB
Salmonella by PCR: NOT DETECTED
Sapovirus: NOT DETECTED
Shigella by PCR: NOT DETECTED
Vibrio cholerae: NOT DETECTED
Vibrio: NOT DETECTED
Yersinia enterocolitica: NOT DETECTED

## 2019-08-30 LAB — GLUCOSE, CAPILLARY
Glucose-Capillary: 136 mg/dL — ABNORMAL HIGH (ref 70–99)
Glucose-Capillary: 150 mg/dL — ABNORMAL HIGH (ref 70–99)

## 2019-08-30 MED ORDER — ASPIRIN 81 MG PO TBEC: 81.0000 mg | DELAYED_RELEASE_TABLET | Freq: Every day | ORAL | 0 refills | Status: DC

## 2019-08-30 MED ORDER — LEVOFLOXACIN 750 MG PO TABS
750.0000 mg | ORAL_TABLET | Freq: Every day | ORAL | Status: DC
  Filled 2019-08-30: qty 1

## 2019-08-30 MED ORDER — LEVOFLOXACIN 750 MG PO TABS: 750.0000 mg | ORAL_TABLET | Freq: Every day | ORAL | 0 refills | Status: DC

## 2019-08-30 MED ORDER — BLOOD GLUCOSE MONITOR KIT: PACK | 0 refills | Status: DC

## 2019-08-30 MED ORDER — ATORVASTATIN CALCIUM 40 MG PO TABS: 40.0000 mg | ORAL_TABLET | Freq: Every day | ORAL | 0 refills | Status: DC

## 2019-08-30 NOTE — Discharge Instructions (Signed)
1. You have been treated for an infection in your urine.  2. The infection in your blood will still need to be treated with an antibiotic for 7 more days.  3. Please follow up with her PCP to discuss tapering her steroid and to check her sodium. You can also discuss changing from insulin to an oral medication for her diabetes if the PCP thinks that is appropriate.  4. A social worker should be helping to assist in setting up a PCP but you can also call your insurance company to find a PCP that is covered.  5. We will call you in a couple days when we get the results from her blood cultures. 6. I will prescribe a glucose meter to check her blood sugars at home.

## 2019-08-30 NOTE — Progress Notes (Signed)
Discharge instructions provided to Tracey Riddle patients niece. Dr Dareen Piano to came to room and answered all questions to her satisfaction. Discharge paperwork provided to patients niece. No further needs. Pt ready for d/c.

## 2019-08-30 NOTE — Progress Notes (Addendum)
Family Medicine Teaching Service Daily Progress Note Intern Pager: [phone]  Patient name: Amyjo Mizrachi Medical record number: [id number] Date of birth: [birth year] Age: 69 y.o. Gender: female  Primary Care Provider: System, Pcp Not In Consultants: None Code Status: Full  Pt Overview and Major Events to Date:  3/10 admitted for dehydration 3/11 blood cx (+) E. Coli, pseudomonas, cefepime tx 3/12 urine cx (+) E. Coli ESBL, meropenem started  Assessment and Plan: Irini Leet is a 69 y.o. female presenting with diarrhea, SOB. PMH is significant for DMT2, H/O TIA, h/o myositis, RA  E. Coli and Pseudomonas bacteremia (sepsis resolved) Patient complains of 3 watery BM yesterday but none today. No abdominal pain and she is tolerating diet. Patient vitals stable and afebrile. Repeat blood cultures collected 3/11 showing e coli without pseudomonas. WBCs continue to be wnl. C diff neg 2 days ago. - continue meropenem (3/12-) day 3. For total of 5 days treatment since patient recovering well. Could consider oral Levaquin per pharm consult for 7 additional days starting today. patient could potentially go home today. - vitals per floor protocol - dc IV fluids today - repeat blood cultures  H/o myositis and RA- on steroid dose pack with long taper. Current dose 40mg  prednisone daily. Unsure of the plan for taper so will likely continue dose until can follow up with PCP. Records not available for review. - follow up with PCP for taper - continue atorvastatin 40mg  cautiously   Hyponatremia-peristent Na 129 today. Most likely due to extrarenal losses in setting of persistent diarrhea. Ur Na, and serum osmolality, lipids WNL.  - Will attempt to discontinue IV fluids today. -Daily BMP  Transaminitis- resolved  CT w/ nodular hepatic contour -f/u hepatitis C labs collected 3/11 -recommend outpatient follow up  DMT2- CBGs overnight 136-201. Continues to be on steroids which are elevating  sugars but seem to be using very modest amount of insulin with SS.  - continue to monitor sugars. - continue sliding scale inpatient which is consistent with patient's home regimen. Would recommend PCP follow up as she could likely be controlled with oral agents alone and A1c is well controlled for age (10)  H/O TIA- -ASA 81 mg -restart statin therapy: atorvastatin 80 mg  FEN/GI: carb modified, PPI PPx: lovenox  Disposition: consider PO Abx switch today  Subjective:  Feels well overall. 3 watery BM yesterday. Today, denies abdominal pain or tenesmus. Able to tolerate diet. She is very happy with the care she is receiving here with her nurses.   Objective: Temp:  [98.1 F (36.7 C)-98.4 F (36.9 C)] 98.1 F (36.7 C) (03/13 2012) Pulse Rate:  [97] 97 (03/13 1133) BP: (126)/(72-81) 126/81 (03/13 2012) SpO2:  [94 %] 94 % (03/13 1133) Weight:  [86.9 kg] 86.9 kg (03/14 0315) Physical Exam: General: older African woman laying comfortably in bed, NAD Cardiovascular: RRR, no m/r/g Respiratory: CTAB, no increased WOB, no wheezes, rales, rhonchi Abdomen: soft, negative tenderness, no guarding, ND, normal bowel sounds + Extremities: trace edema to LE bilaterally  Laboratory: Recent Labs  Lab 08/28/19 0144 08/29/19 0837 08/30/19 0156  WBC 5.1 4.5 6.1  HGB 11.5* 11.1* 11.8*  HCT 33.8* 31.8* 34.5*  PLT 130* 125* 140*   Recent Labs  Lab 08/26/19 0836 08/26/19 0836 08/27/19 0533 08/27/19 0533 08/28/19 0144 08/29/19 0837 08/30/19 0156  NA 128*   < > 131*   < > 131* 132* 129*  K 3.5   < > 4.0   < > 4.0 3.1* 4.5  CL 97*   < > 100   < > 99 102 101  CO2 20*   < > 21*   < > 22 21* 18*  BUN 14   < > 10   < > 10 7* 8  CREATININE 0.83   < > 0.75   < > 0.82 0.74 0.70  CALCIUM 7.9*   < > 7.7*   < > 8.2* 7.8* 7.9*  PROT 6.1*  --  4.9*  --   --   --  6.0*  BILITOT 1.2  --  0.9  --   --   --  1.4*  ALKPHOS 73  --  57  --   --   --  73  ALT 46*  --  30  --   --   --  35  AST 34  --  15   --   --   --  28  GLUCOSE 280*   < > 196*   < > 153* 123* 201*   < > = values in this interval not displayed.   Imaging/Diagnostic Tests: No results found. Leeroy Bock, DO 08/30/2019, 9:26 AM PGY-2, [place] [facility] FPTS Intern pager: [phone], text pages welcome

## 2019-08-30 NOTE — Progress Notes (Signed)
Attempted to call Euginie x2 and left voicemail. Called Emma x1 and left VM.  Patient to be discharged today with pending blood cultures which can be called outpatient with results. Patient instructed to complete antibiotics and follow up with PCP for sodium monitoring and steroid taper. - will attempt to call nieces again

## 2019-08-30 NOTE — TOC Initial Note (Addendum)
Transition of Care [facility]) - Initial/Assessment Note    Patient Details  Name: Tracey Riddle MRN: [id number] Date of Birth: 03-15-1951  Transition [facility]) CM/SW Contact:    Lawerance Sabal, RN Phone Number: 08/30/2019, 10:28 AM  Clinical Narrative:              Patient unable to fully converse in English, nurse states Niece is caregiver suggested calling her. Spoke w Niece, she states that patient was active w [facility] prior to admission. I notified [facility] of admission.       Placed resumption orders, and [facility] notified of DC.    Expected Discharge Plan: Home w Home Health Services     Patient Goals and CMS Choice        Expected Discharge Plan and Services Expected Discharge Plan: Home w Home Health Services                                   HH Arranged: PT [facility] Agency: [facility] Date [facility] Agency Contacted: 08/30/19 Time HH Agency Contacted: 1028 Representative spoke with at [facility] Agency: Notified Katina of admission  Prior Living Arrangements/Services                  Current home services: Home PT    Activities of Daily Living Home Assistive Devices/Equipment: CBG Meter, Eyeglasses, Walker (specify type) ADL Screening (condition at time of admission) Patient's cognitive ability adequate to safely complete daily activities?: Yes Is the patient deaf or have difficulty hearing?: No Does the patient have difficulty seeing, even when wearing glasses/contacts?: No Does the patient have difficulty concentrating, remembering, or making decisions?: No Patient able to express need for assistance with ADLs?: Yes Does the patient have difficulty dressing or bathing?: Yes Independently performs ADLs?: No Communication: Independent(Needs translator) Dressing (OT): Needs assistance Is this a change from baseline?: Pre-admission baseline Grooming: Needs assistance Is this a change from baseline?: Pre-admission baseline Feeding:  Independent Bathing: Dependent Is this a change from baseline?: Pre-admission baseline Toileting: Dependent Is this a change from baseline?: Pre-admission baseline In/Out Bed: Dependent Is this a change from baseline?: Pre-admission baseline Walks in Home: Dependent Is this a change from baseline?: Pre-admission baseline Does the patient have difficulty walking or climbing stairs?: Yes Weakness of Legs: Both Weakness of Arms/Hands: None  Permission Sought/Granted                  Emotional Assessment              Admission diagnosis:  Hyponatremia [E87.1] Sepsis (HCC) [A41.9] Diarrhea, unspecified type [R19.7] Patient Active Problem List   Diagnosis Date Noted  . Diarrhea 08/27/2019  . Hyponatremia 08/27/2019  . Pyuria 08/27/2019  . Abnormal liver CT 08/27/2019  . Diabetes mellitus type II, controlled (HCC) 08/27/2019  . Pseudomonas sepsis (HCC) 08/27/2019  . Bacteremia due to Escherichia coli 08/27/2019  . Hypoproteinemia (HCC) 08/27/2019  . Thrombocytopenia (HCC) 08/27/2019  . Long-term corticosteroid use 08/27/2019   PCP:  System, Pcp Not In Pharmacy:   [id number] - [place], [place] - [address] AT [facility] [address] [place] [address] Phone: [phone] Fax: [phone]     Social Determinants of Health (SDOH) Interventions    Readmission Risk Interventions No flowsheet data found.

## 2019-08-31 LAB — CULTURE, BLOOD (ROUTINE X 2)
Culture: NO GROWTH
Special Requests: ADEQUATE

## 2019-08-31 LAB — HCV RT-PCR, QUANT (NON-GRAPH)

## 2019-08-31 LAB — HCV AB W REFLEX TO QUANT PCR: HCV Ab: >11 s/co ratio — ABNORMAL HIGH (ref 0.0–0.9)

## 2019-09-01 LAB — CULTURE, BLOOD (ROUTINE X 2)
Culture: NO GROWTH
Special Requests: ADEQUATE

## 2019-09-03 ENCOUNTER — Inpatient Hospital Stay (HOSPITAL_COMMUNITY)
Admission: EM | Admit: 2019-09-03 | Discharge: 2019-09-15 | DRG: 392 | Disposition: A | Discharge status: Skilled Nursing Facility | Payer: Medicare Other | Attending: Family Medicine | Admitting: Family Medicine

## 2019-09-03 DIAGNOSIS — Z1612 Extended spectrum beta lactamase (ESBL) resistance: Secondary | ICD-10-CM | POA: Diagnosis present

## 2019-09-03 DIAGNOSIS — B965 Pseudomonas (aeruginosa) (mallei) (pseudomallei) as the cause of diseases classified elsewhere: Secondary | ICD-10-CM | POA: Diagnosis present

## 2019-09-03 DIAGNOSIS — N179 Acute kidney failure, unspecified: Secondary | ICD-10-CM | POA: Diagnosis present

## 2019-09-03 DIAGNOSIS — K2970 Gastritis, unspecified, without bleeding: Secondary | ICD-10-CM | POA: Diagnosis present

## 2019-09-03 DIAGNOSIS — B1920 Unspecified viral hepatitis C without hepatic coma: Secondary | ICD-10-CM

## 2019-09-03 DIAGNOSIS — B3781 Candidal esophagitis: Secondary | ICD-10-CM | POA: Diagnosis present

## 2019-09-03 DIAGNOSIS — R7881 Bacteremia: Secondary | ICD-10-CM | POA: Diagnosis present

## 2019-09-03 DIAGNOSIS — Z20822 Contact with and (suspected) exposure to covid-19: Secondary | ICD-10-CM | POA: Diagnosis present

## 2019-09-03 DIAGNOSIS — E860 Dehydration: Secondary | ICD-10-CM | POA: Diagnosis present

## 2019-09-03 DIAGNOSIS — R1011 Right upper quadrant pain: Secondary | ICD-10-CM | POA: Diagnosis not present

## 2019-09-03 DIAGNOSIS — Z79899 Other long term (current) drug therapy: Secondary | ICD-10-CM

## 2019-09-03 DIAGNOSIS — Z23 Encounter for immunization: Secondary | ICD-10-CM

## 2019-09-03 DIAGNOSIS — T380X5A Adverse effect of glucocorticoids and synthetic analogues, initial encounter: Secondary | ICD-10-CM | POA: Diagnosis present

## 2019-09-03 DIAGNOSIS — K5900 Constipation, unspecified: Secondary | ICD-10-CM | POA: Diagnosis not present

## 2019-09-03 DIAGNOSIS — Z794 Long term (current) use of insulin: Secondary | ICD-10-CM

## 2019-09-03 DIAGNOSIS — B780 Intestinal strongyloidiasis: Principal | ICD-10-CM | POA: Diagnosis present

## 2019-09-03 DIAGNOSIS — R112 Nausea with vomiting, unspecified: Secondary | ICD-10-CM

## 2019-09-03 DIAGNOSIS — Z8619 Personal history of other infectious and parasitic diseases: Secondary | ICD-10-CM | POA: Diagnosis present

## 2019-09-03 DIAGNOSIS — Z7982 Long term (current) use of aspirin: Secondary | ICD-10-CM

## 2019-09-03 DIAGNOSIS — Z7952 Long term (current) use of systemic steroids: Secondary | ICD-10-CM

## 2019-09-03 DIAGNOSIS — K2980 Duodenitis without bleeding: Secondary | ICD-10-CM | POA: Diagnosis present

## 2019-09-03 DIAGNOSIS — M069 Rheumatoid arthritis, unspecified: Secondary | ICD-10-CM | POA: Diagnosis present

## 2019-09-03 DIAGNOSIS — D84821 Immunodeficiency due to drugs: Secondary | ICD-10-CM

## 2019-09-03 DIAGNOSIS — K7460 Unspecified cirrhosis of liver: Secondary | ICD-10-CM | POA: Diagnosis present

## 2019-09-03 DIAGNOSIS — D696 Thrombocytopenia, unspecified: Secondary | ICD-10-CM | POA: Diagnosis not present

## 2019-09-03 DIAGNOSIS — E871 Hypo-osmolality and hyponatremia: Secondary | ICD-10-CM | POA: Diagnosis present

## 2019-09-03 DIAGNOSIS — N390 Urinary tract infection, site not specified: Secondary | ICD-10-CM | POA: Diagnosis present

## 2019-09-03 DIAGNOSIS — K649 Unspecified hemorrhoids: Secondary | ICD-10-CM | POA: Diagnosis present

## 2019-09-03 DIAGNOSIS — Z8673 Personal history of transient ischemic attack (TIA), and cerebral infarction without residual deficits: Secondary | ICD-10-CM

## 2019-09-03 DIAGNOSIS — B182 Chronic viral hepatitis C: Secondary | ICD-10-CM | POA: Diagnosis present

## 2019-09-03 DIAGNOSIS — D649 Anemia, unspecified: Secondary | ICD-10-CM | POA: Diagnosis present

## 2019-09-03 DIAGNOSIS — E1165 Type 2 diabetes mellitus with hyperglycemia: Secondary | ICD-10-CM | POA: Diagnosis present

## 2019-09-03 LAB — CULTURE, BLOOD (ROUTINE X 2)
Culture: NO GROWTH
Culture: NO GROWTH
Special Requests: ADEQUATE

## 2019-09-03 NOTE — ED Triage Notes (Signed)
Pt bib gcems w/ c/o abdominal pain. Pt recently admitted to hospital for infection of unknown origin and d/c on Sunday w/ PO ABX. Pt has been c/o abdominal pain and n/v since d/c. Pt has vomited 5x today.

## 2019-09-04 ENCOUNTER — Inpatient Hospital Stay (HOSPITAL_COMMUNITY): Payer: Medicare Other

## 2019-09-04 ENCOUNTER — Encounter (HOSPITAL_COMMUNITY): Payer: Self-pay

## 2019-09-04 ENCOUNTER — Other Ambulatory Visit: Payer: Self-pay

## 2019-09-04 DIAGNOSIS — Z794 Long term (current) use of insulin: Secondary | ICD-10-CM | POA: Diagnosis not present

## 2019-09-04 DIAGNOSIS — T380X5A Adverse effect of glucocorticoids and synthetic analogues, initial encounter: Secondary | ICD-10-CM | POA: Diagnosis present

## 2019-09-04 DIAGNOSIS — M069 Rheumatoid arthritis, unspecified: Secondary | ICD-10-CM | POA: Diagnosis present

## 2019-09-04 DIAGNOSIS — K7460 Unspecified cirrhosis of liver: Secondary | ICD-10-CM | POA: Diagnosis present

## 2019-09-04 DIAGNOSIS — R112 Nausea with vomiting, unspecified: Secondary | ICD-10-CM

## 2019-09-04 DIAGNOSIS — K5900 Constipation, unspecified: Secondary | ICD-10-CM | POA: Diagnosis not present

## 2019-09-04 DIAGNOSIS — B965 Pseudomonas (aeruginosa) (mallei) (pseudomallei) as the cause of diseases classified elsewhere: Secondary | ICD-10-CM | POA: Diagnosis present

## 2019-09-04 DIAGNOSIS — D696 Thrombocytopenia, unspecified: Secondary | ICD-10-CM | POA: Diagnosis not present

## 2019-09-04 DIAGNOSIS — Z7952 Long term (current) use of systemic steroids: Secondary | ICD-10-CM | POA: Diagnosis not present

## 2019-09-04 DIAGNOSIS — E860 Dehydration: Secondary | ICD-10-CM | POA: Diagnosis present

## 2019-09-04 DIAGNOSIS — N179 Acute kidney failure, unspecified: Secondary | ICD-10-CM | POA: Diagnosis present

## 2019-09-04 DIAGNOSIS — Z20822 Contact with and (suspected) exposure to covid-19: Secondary | ICD-10-CM | POA: Diagnosis present

## 2019-09-04 DIAGNOSIS — E871 Hypo-osmolality and hyponatremia: Secondary | ICD-10-CM

## 2019-09-04 DIAGNOSIS — Z8673 Personal history of transient ischemic attack (TIA), and cerebral infarction without residual deficits: Secondary | ICD-10-CM | POA: Diagnosis not present

## 2019-09-04 DIAGNOSIS — E119 Type 2 diabetes mellitus without complications: Secondary | ICD-10-CM | POA: Diagnosis not present

## 2019-09-04 DIAGNOSIS — K2980 Duodenitis without bleeding: Secondary | ICD-10-CM | POA: Diagnosis present

## 2019-09-04 DIAGNOSIS — K2970 Gastritis, unspecified, without bleeding: Secondary | ICD-10-CM | POA: Diagnosis present

## 2019-09-04 DIAGNOSIS — B182 Chronic viral hepatitis C: Secondary | ICD-10-CM | POA: Diagnosis present

## 2019-09-04 DIAGNOSIS — B1920 Unspecified viral hepatitis C without hepatic coma: Secondary | ICD-10-CM | POA: Diagnosis not present

## 2019-09-04 DIAGNOSIS — B3781 Candidal esophagitis: Secondary | ICD-10-CM | POA: Diagnosis present

## 2019-09-04 DIAGNOSIS — E1165 Type 2 diabetes mellitus with hyperglycemia: Secondary | ICD-10-CM | POA: Diagnosis not present

## 2019-09-04 DIAGNOSIS — N390 Urinary tract infection, site not specified: Secondary | ICD-10-CM | POA: Diagnosis present

## 2019-09-04 DIAGNOSIS — Z23 Encounter for immunization: Secondary | ICD-10-CM | POA: Diagnosis present

## 2019-09-04 DIAGNOSIS — R7881 Bacteremia: Secondary | ICD-10-CM | POA: Diagnosis present

## 2019-09-04 DIAGNOSIS — Z1612 Extended spectrum beta lactamase (ESBL) resistance: Secondary | ICD-10-CM | POA: Diagnosis present

## 2019-09-04 DIAGNOSIS — D649 Anemia, unspecified: Secondary | ICD-10-CM | POA: Diagnosis present

## 2019-09-04 DIAGNOSIS — Z7982 Long term (current) use of aspirin: Secondary | ICD-10-CM | POA: Diagnosis not present

## 2019-09-04 DIAGNOSIS — B780 Intestinal strongyloidiasis: Secondary | ICD-10-CM | POA: Diagnosis present

## 2019-09-04 DIAGNOSIS — R1011 Right upper quadrant pain: Secondary | ICD-10-CM | POA: Diagnosis present

## 2019-09-04 DIAGNOSIS — R1110 Vomiting, unspecified: Secondary | ICD-10-CM | POA: Insufficient documentation

## 2019-09-04 LAB — CULTURE, BLOOD (ROUTINE X 2)
Culture: NO GROWTH
Culture: NO GROWTH
Special Requests: ADEQUATE
Special Requests: ADEQUATE

## 2019-09-04 LAB — COMPREHENSIVE METABOLIC PANEL
ALT: 37 U/L (ref 0–44)
AST: 33 U/L (ref 15–41)
Albumin: 2.4 g/dL — ABNORMAL LOW (ref 3.5–5.0)
Alkaline Phosphatase: 69 U/L (ref 38–126)
Anion gap: 14 (ref 5–15)
BUN: 19 mg/dL (ref 8–23)
CO2: 21 mmol/L — ABNORMAL LOW (ref 22–32)
Calcium: 8.1 mg/dL — ABNORMAL LOW (ref 8.9–10.3)
Chloride: 88 mmol/L — ABNORMAL LOW (ref 98–111)
Creatinine, Ser: 1.04 mg/dL — ABNORMAL HIGH (ref 0.44–1.00)
GFR calc Af Amer: >60 mL/min (ref >60)
GFR calc non Af Amer: 54 mL/min — ABNORMAL LOW (ref >60)
Glucose, Bld: 195 mg/dL — ABNORMAL HIGH (ref 70–99)
Potassium: 4.5 mmol/L (ref 3.5–5.1)
Sodium: 123 mmol/L — ABNORMAL LOW (ref 135–145)
Total Bilirubin: 1.7 mg/dL — ABNORMAL HIGH (ref 0.3–1.2)
Total Protein: 5.8 g/dL — ABNORMAL LOW (ref 6.5–8.1)

## 2019-09-04 LAB — CBC
HCT: 37.3 % (ref 36.0–46.0)
Hemoglobin: 12.9 g/dL (ref 12.0–15.0)
MCH: 33.1 pg (ref 26.0–34.0)
MCHC: 34.6 g/dL (ref 30.0–36.0)
MCV: 95.6 fL (ref 80.0–100.0)
Platelets: 205 10*3/uL (ref 150–400)
RBC: 3.9 MIL/uL (ref 3.87–5.11)
RDW: 12.7 % (ref 11.5–15.5)
WBC: 7.3 10*3/uL (ref 4.0–10.5)
nRBC: 0.3 % — ABNORMAL HIGH (ref 0.0–0.2)

## 2019-09-04 LAB — URINALYSIS, ROUTINE W REFLEX MICROSCOPIC
Bilirubin Urine: NEGATIVE
Glucose, UA: NEGATIVE mg/dL
Hgb urine dipstick: NEGATIVE
Ketones, ur: 5 mg/dL — AB
Leukocytes,Ua: NEGATIVE
Nitrite: NEGATIVE
Protein, ur: 30 mg/dL — AB
Specific Gravity, Urine: 1.025 (ref 1.005–1.030)
pH: 5 (ref 5.0–8.0)

## 2019-09-04 LAB — BASIC METABOLIC PANEL
Anion gap: 13 (ref 5–15)
BUN: 12 mg/dL (ref 8–23)
CO2: 18 mmol/L — ABNORMAL LOW (ref 22–32)
Calcium: 7.4 mg/dL — ABNORMAL LOW (ref 8.9–10.3)
Chloride: 98 mmol/L (ref 98–111)
Creatinine, Ser: 0.8 mg/dL (ref 0.44–1.00)
GFR calc Af Amer: >60 mL/min (ref >60)
GFR calc non Af Amer: >60 mL/min (ref >60)
Glucose, Bld: 169 mg/dL — ABNORMAL HIGH (ref 70–99)
Potassium: 4.4 mmol/L (ref 3.5–5.1)
Sodium: 129 mmol/L — ABNORMAL LOW (ref 135–145)

## 2019-09-04 LAB — OSMOLALITY, URINE: Osmolality, Ur: 705 mOsm/kg (ref 300–900)

## 2019-09-04 LAB — BILIRUBIN, FRACTIONATED(TOT/DIR/INDIR)
Bilirubin, Direct: 0.3 mg/dL — ABNORMAL HIGH (ref 0.0–0.2)
Indirect Bilirubin: 1.2 mg/dL — ABNORMAL HIGH (ref 0.3–0.9)
Total Bilirubin: 1.5 mg/dL — ABNORMAL HIGH (ref 0.3–1.2)

## 2019-09-04 LAB — SODIUM, URINE, RANDOM: Sodium, Ur: 32 mmol/L

## 2019-09-04 LAB — SARS CORONAVIRUS 2 (TAT 6-24 HRS): SARS Coronavirus 2: NEGATIVE

## 2019-09-04 LAB — CBG MONITORING, ED: Glucose-Capillary: 165 mg/dL — ABNORMAL HIGH (ref 70–99)

## 2019-09-04 LAB — GLUCOSE, CAPILLARY
Glucose-Capillary: 150 mg/dL — ABNORMAL HIGH (ref 70–99)
Glucose-Capillary: 148 mg/dL — ABNORMAL HIGH (ref 70–99)
Glucose-Capillary: 179 mg/dL — ABNORMAL HIGH (ref 70–99)

## 2019-09-04 LAB — TSH: TSH: 0.694 u[IU]/mL (ref 0.350–4.500)

## 2019-09-04 LAB — LIPASE, BLOOD: Lipase: 54 U/L — ABNORMAL HIGH (ref 11–51)

## 2019-09-04 MED ORDER — SODIUM CHLORIDE 0.9 % IV BOLUS
1000.0000 mL | Freq: Once | INTRAVENOUS | Status: AC
  Administered 2019-09-04: 1000 mL via INTRAVENOUS

## 2019-09-04 MED ORDER — BISACODYL 10 MG RE SUPP
10.0000 mg | Freq: Every morning | RECTAL | Status: DC
  Administered 2019-09-04 – 2019-09-06 (×3): 10 mg via RECTAL
  Filled 2019-09-04 (×3): qty 1

## 2019-09-04 MED ORDER — IOHEXOL 300 MG/ML  SOLN
100.0000 mL | Freq: Once | INTRAMUSCULAR | Status: AC | PRN
  Administered 2019-09-04: 100 mL via INTRAVENOUS

## 2019-09-04 MED ORDER — PANTOPRAZOLE SODIUM 40 MG IV SOLR: 40.0000 mg | Freq: Every day | INTRAVENOUS | Status: DC

## 2019-09-04 MED ORDER — ONDANSETRON HCL 4 MG/2ML IJ SOLN
4.0000 mg | Freq: Once | INTRAMUSCULAR | Status: AC
  Administered 2019-09-04: 4 mg via INTRAVENOUS
  Filled 2019-09-04: qty 2

## 2019-09-04 MED ORDER — SODIUM CHLORIDE 0.9 % IV SOLN
2.0000 g | Freq: Two times a day (BID) | INTRAVENOUS | Status: AC
  Administered 2019-09-04 – 2019-09-06 (×6): 2 g via INTRAVENOUS
  Filled 2019-09-04 (×6): qty 2

## 2019-09-04 MED ORDER — ASPIRIN EC 81 MG PO TBEC
81.0000 mg | DELAYED_RELEASE_TABLET | Freq: Every day | ORAL | Status: DC
  Administered 2019-09-04 – 2019-09-15 (×11): 81 mg via ORAL
  Filled 2019-09-04 (×11): qty 1

## 2019-09-04 MED ORDER — POLYETHYLENE GLYCOL 3350 17 G PO PACK: 17.0000 g | PACK | Freq: Two times a day (BID) | ORAL | Status: DC

## 2019-09-04 MED ORDER — ONDANSETRON HCL 4 MG PO TABS: 4.0000 mg | ORAL_TABLET | Freq: Four times a day (QID) | ORAL | Status: DC | PRN

## 2019-09-04 MED ORDER — SODIUM CHLORIDE 0.9% FLUSH
3.0000 mL | Freq: Once | INTRAVENOUS | Status: AC
  Administered 2019-09-04: 3 mL via INTRAVENOUS

## 2019-09-04 MED ORDER — PANTOPRAZOLE SODIUM 40 MG IV SOLR
40.0000 mg | Freq: Two times a day (BID) | INTRAVENOUS | Status: DC
  Administered 2019-09-04 – 2019-09-06 (×6): 40 mg via INTRAVENOUS
  Filled 2019-09-04 (×6): qty 40

## 2019-09-04 MED ORDER — POLYETHYLENE GLYCOL 3350 17 G PO PACK: 17.0000 g | PACK | Freq: Every day | ORAL | Status: DC

## 2019-09-04 MED ORDER — HYDROMORPHONE HCL 1 MG/ML IJ SOLN
0.5000 mg | Freq: Once | INTRAMUSCULAR | Status: AC
  Administered 2019-09-04: 0.5 mg via INTRAVENOUS
  Filled 2019-09-04: qty 1

## 2019-09-04 MED ORDER — MORPHINE SULFATE (PF) 2 MG/ML IV SOLN
1.0000 mg | Freq: Once | INTRAVENOUS | Status: AC
  Administered 2019-09-04: 1 mg via INTRAVENOUS
  Filled 2019-09-04: qty 1

## 2019-09-04 MED ORDER — METHYLPREDNISOLONE SODIUM SUCC 40 MG IJ SOLR
32.0000 mg | Freq: Every day | INTRAMUSCULAR | Status: DC
  Administered 2019-09-04 – 2019-09-06 (×3): 32 mg via INTRAVENOUS
  Filled 2019-09-04 (×3): qty 1

## 2019-09-04 MED ORDER — ONDANSETRON HCL 4 MG/2ML IJ SOLN
4.0000 mg | Freq: Four times a day (QID) | INTRAMUSCULAR | Status: DC | PRN
  Administered 2019-09-04 – 2019-09-09 (×4): 4 mg via INTRAVENOUS
  Filled 2019-09-04 (×4): qty 2

## 2019-09-04 MED ORDER — INSULIN ASPART 100 UNIT/ML ~~LOC~~ SOLN
0.0000 [IU] | SUBCUTANEOUS | Status: DC
  Administered 2019-09-04: 1 [IU] via SUBCUTANEOUS
  Administered 2019-09-04: 2 [IU] via SUBCUTANEOUS
  Administered 2019-09-04 – 2019-09-05 (×2): 1 [IU] via SUBCUTANEOUS
  Administered 2019-09-05: 2 [IU] via SUBCUTANEOUS
  Administered 2019-09-05: 1 [IU] via SUBCUTANEOUS
  Administered 2019-09-05: 2 [IU] via SUBCUTANEOUS
  Administered 2019-09-05: 3 [IU] via SUBCUTANEOUS
  Administered 2019-09-05: 1 [IU] via SUBCUTANEOUS
  Administered 2019-09-06: 3 [IU] via SUBCUTANEOUS
  Administered 2019-09-06: 2 [IU] via SUBCUTANEOUS
  Administered 2019-09-06: 3 [IU] via SUBCUTANEOUS
  Administered 2019-09-06: 1 [IU] via SUBCUTANEOUS
  Administered 2019-09-06 – 2019-09-07 (×2): 2 [IU] via SUBCUTANEOUS
  Administered 2019-09-07 (×2): 1 [IU] via SUBCUTANEOUS
  Administered 2019-09-07: 3 [IU] via SUBCUTANEOUS
  Administered 2019-09-07 (×2): 1 [IU] via SUBCUTANEOUS

## 2019-09-04 MED ORDER — INSULIN ASPART 100 UNIT/ML ~~LOC~~ SOLN
0.0000 [IU] | Freq: Three times a day (TID) | SUBCUTANEOUS | Status: DC
  Administered 2019-09-04: 2 [IU] via SUBCUTANEOUS

## 2019-09-04 MED ORDER — SUCRALFATE 1 GM/10ML PO SUSP
1.0000 g | Freq: Three times a day (TID) | ORAL | Status: DC
  Administered 2019-09-04 – 2019-09-08 (×14): 1 g via ORAL
  Filled 2019-09-04 (×14): qty 10

## 2019-09-04 MED ORDER — SODIUM CHLORIDE 0.9 % IV SOLN: INTRAVENOUS | Status: DC

## 2019-09-04 MED ORDER — PANTOPRAZOLE SODIUM 40 MG IV SOLR
40.0000 mg | Freq: Once | INTRAVENOUS | Status: AC
  Administered 2019-09-04: 40 mg via INTRAVENOUS
  Filled 2019-09-04: qty 40

## 2019-09-04 MED ORDER — ENOXAPARIN SODIUM 40 MG/0.4ML ~~LOC~~ SOLN
40.0000 mg | SUBCUTANEOUS | Status: DC
  Administered 2019-09-04 – 2019-09-15 (×12): 40 mg via SUBCUTANEOUS
  Filled 2019-09-04 (×13): qty 0.4

## 2019-09-04 NOTE — Evaluation (Signed)
Physical Therapy Evaluation Patient Details Name: Tracey Riddle MRN: [id number] DOB: [birth year] Today's Date: 09/04/2019   History of Present Illness  Pt is a 69 y/o female admitted secondary to worsening abdominal pain. Found to have hyponatremia. Pt with recent admission for sepsis from viral gastroenteritis. PMH includes CKD, DM, and TIA.   Clinical Impression  Pt admitted secondary to problem above with deficits below. Pt very weak and reports inability to get out of bed the last week since previous admission. Was only able to tolerate rolling this session. Educated about SNF recommendations, however, pt's niece would prefer to take pt home if possible. Will continue to follow acutely to maximize functional mobility independence and safety.     Follow Up Recommendations SNF;Supervision/Assistance - 24 hour(if refuses will need max HH services)    Equipment Recommendations  Wheelchair (measurements PT);Wheelchair cushion (measurements PT);Hospital bed;Other (comment)(hoyer lift and hoyer lift pad)    Recommendations for Other Services       Precautions / Restrictions Precautions Precautions: Fall Restrictions Weight Bearing Restrictions: No      Mobility  Bed Mobility Overal bed mobility: Needs Assistance Bed Mobility: Rolling Rolling: Max assist         General bed mobility comments: Max A to roll from side to side. Pt extremely weak, but was able to assist some. Pt reports feeling bad, so further mobility deferred.   Transfers                    Ambulation/Gait                Stairs            Wheelchair Mobility    Modified Rankin (Stroke Patients Only)       Balance                                             Pertinent Vitals/Pain Pain Assessment: Faces Faces Pain Scale: Hurts even more Pain Location: L knee  Pain Descriptors / Indicators: Grimacing;Guarding Pain Intervention(s): Limited activity within  patient's tolerance;Monitored during session;Repositioned    Home Living Family/patient expects to be discharged to:: Private residence Living Arrangements: Other relatives(sister, niece) Available Help at Discharge: Family;Available 24 hours/day Type of Home: House Home Access: Stairs to enter Entrance Stairs-Rails: Right Entrance Stairs-Number of Steps: 3 Home Layout: Two level;Able to live on main level with bedroom/bathroom Home Equipment: Gilford Rile - 2 wheels;Shower seat      Prior Function Level of Independence: Needs assistance   Gait / Transfers Assistance Needed: Pt's niece reports pt was ambulating with RW, however, since d/c from hospital has not been able to get out of bed.   ADL's / Homemaking Assistance Needed: Has needed assist with sponge bathing since discharge from hospital         Hand Dominance        Extremity/Trunk Assessment   Upper Extremity Assessment Upper Extremity Assessment: Defer to OT evaluation;RUE deficits/detail;LUE deficits/detail RUE Deficits / Details: Very weak. Diminished grip strength and only able to lift arms to shoulder height because of weakness.  LUE Deficits / Details: Very weak. Diminished grip strength and only able to lift arms to shoulder height because of weakness.     Lower Extremity Assessment Lower Extremity Assessment: RLE deficits/detail;LLE deficits/detail RLE Deficits / Details: Increased weakness noted. Was unable to perform ankle pump and heel  slide. PROM WFL LLE Deficits / Details: Increased weakness noted. Was unable to perform ankle pump and heel slide. PROM WFL       Communication   Communication: Prefers language other than English(Niece interpreted; don't have dialect for formal interpreter)  Cognition Arousal/Alertness: Awake/alert Behavior During Therapy: Flat affect Overall Cognitive Status: Within Functional Limits for tasks assessed                                        General  Comments General comments (skin integrity, edema, etc.): Pt's niece present. Explained recommendations for SNF, however, niece reports she would prefer to take pt home.     Exercises     Assessment/Plan    PT Assessment Patient needs continued PT services  PT Problem List Decreased strength;Decreased activity tolerance;Decreased balance;Decreased mobility;Decreased knowledge of use of DME;Decreased safety awareness       PT Treatment Interventions DME instruction;Gait training;Stair training;Functional mobility training;Therapeutic activities;Therapeutic exercise;Balance training;Neuromuscular re-education;Patient/family education    PT Goals (Current goals can be found in the Care Plan section)  Acute Rehab PT Goals Patient Stated Goal: to get stronger and feel better PT Goal Formulation: With patient/family Time For Goal Achievement: 09/18/19 Potential to Achieve Goals: Fair    Frequency Min 3X/week   Barriers to discharge        Co-evaluation               AM-PAC PT "6 Clicks" Mobility  Outcome Measure Help needed turning from your back to your side while in a flat bed without using bedrails?: Total Help needed moving from lying on your back to sitting on the side of a flat bed without using bedrails?: Total Help needed moving to and from a bed to a chair (including a wheelchair)?: Total Help needed standing up from a chair using your arms (e.g., wheelchair or bedside chair)?: Total Help needed to walk in hospital room?: Total Help needed climbing 3-5 steps with a railing? : Total 6 Click Score: 6    End of Session   Activity Tolerance: Treatment limited secondary to medical complications (Comment) Patient left: in bed;with call bell/phone within reach;with family/visitor present Nurse Communication: Mobility status PT Visit Diagnosis: Muscle weakness (generalized) (M62.81);Difficulty in walking, not elsewhere classified (R26.2)    Time: 6948-5462 PT Time  Calculation (min) (ACUTE ONLY): 16 min   Charges:   PT Evaluation $PT Eval Moderate Complexity: 1 Mod          Farley Ly, PT, DPT  Acute Rehabilitation Services  Pager: [phone] Office: [phone]   Lehman Prom 09/04/2019, 5:08 PM

## 2019-09-04 NOTE — Progress Notes (Addendum)
Pharmacy Antibiotic Note  Tracey Riddle is a 69 y.o. female with is significant forCKD, DMT2, H/O TIA, RA, and myositis admitted on 09/03/2019 with intractable abdominal pain and nausea. She was found to have bacteremia from previous admission. Pharmacy has been consulted for cefepime dosing.  Renal function is lower than baseline with Scr elevated from 0.7 to 1.04. Vital signs are stable but patient is tachycardic. Patient is currently afebrile and WBC is wnls. Patient was discharged on oral levofloxacin to finish therapy on 3/21 for E.coli and Pseudomonas bacteremia however she came in with persistent abdominal pain and nausea. No current concern that patient has ESBL E.coli as previously observed in urine culture from last admission since most recent UA does not show any bacteria. Patient's UTI was treated with meropenem during previous admission.  Plan: Start cefepime IV 2g Q12 hours and plan to finish course for E.coli and Pseudomonas bacteremia.  Watch clinical status, renal function, WBC, temp, and cultures F/U on length of therapy. Current plan is to treat until 3/21     Temp (24hrs), Avg:97.9 F (36.6 C), Min:97.9 F (36.6 C), Max:97.9 F (36.6 C)  Recent Labs  Lab 08/29/19 0837 08/30/19 0156 09/04/19 0026  WBC 4.5 6.1 7.3  CREATININE 0.74 0.70 1.04*    Estimated Creatinine Clearance: 51.9 mL/min (A) (by C-G formula based on SCr of 1.04 mg/dL (H)).    No Known Allergies  Antimicrobials from previous and current admission: Cefepime 3/11>> 3/12 Meropenem 3/12>> 3/16 Discharged on oral levofloxacin 3/16>>3/18 Cefepime 3/19 >>  Microbiology results: 3/10 BCx: E.coli and Pseudomonas both pan-sensative to tested antibiotics 3/10 UCx: > 100,000 ESBL E.coli (treated during prior hospitalization) 3/10 MRSA PCR: Negative 3/11 C Diff: negative; Rotavirus A positive 3/11: Hep C Ab positive, F/U further workup 3/13: Bcx: NGf 3/14: Bcx: NGf 3/19: COVID: Negative 3/19: Bcx:  Pending 3/19 Ucx: sent  Thank you for allowing pharmacy to be a part of this patient's care.  Alvia Grove, PharmD PGY1 Acute Care Pharmacy Resident 09/04/2019 12:00 PM

## 2019-09-04 NOTE — ED Notes (Signed)
Message sent to [facility] Medicine service to inquire if pt should remain NPO or have diet order updated. Will hold Miralax at this time.

## 2019-09-04 NOTE — ED Notes (Signed)
Pt consistently reporting abdominal pain rated 6/10, unrelieved by nursing interventions--repositioning, reducing environmental stimuli. MD made aware, RN awaiting instruction.

## 2019-09-04 NOTE — H&P (Addendum)
[facility] Admission History and Physical Service Pager: [phone]  Patient name: Tracey Riddle Medical record number: [id number] Date of birth: Oct 16, 1950 Age: 69 y.o. Gender: female  Primary Care Provider: System, Pcp Not In Consultants: None Code Status: Full Code Preferred Emergency Contact: Miki Kins [phone], Terrence Dupont [phone] Preferred Language: Alveta Heimlich. Please call [facility] if nieces are not present.  Chief Complaint: abdominal pain and vomiting  Assessment and Plan: Tracey Riddle is a 69 y.o. female presenting with abdominal pain and nausea. PMH is significant for DMT2, H/O TIA, reported h/o RA and myositis  Abdominal Pain  Nausea and Vomiting  Hyponatremia Patient presenting today with 4 days of nausea, vomiting, and abdominal pain. Recently admitted with similar presentation, found to have pan-sensitive e.coli and pseudomonas bacteremia as well as ESBL e.coli UTI and rotovirus. She was treated with meropenem IV and switched to levaquin for total 7 day treatment and discharged on 3/14. Niece states that patient completed course of levaquin, although they are concerned that levaquin was causing nausea and vomiting. Vital signs are stable with mild tachycardia to 100-110s, likely due to dehydration. Labs show hyponatremia to 123, glucose elevated to 195, chloride low to 88, potassium WNL at 4.5. Patient also had hyponatremia at last admission with similar presenting symptoms: serum osmolality low today at 273. No recent surgeries, lipid panel last week with well controlled total cholesterol at 95. Mild AKI today, cr elevated to 1.04, baseline 0.7-0.8. Patient was on furosemide per PCP, though no history of HF, and otherwise normal kidney function; no thiazide diuretic on board. No evidence of volume overload such as edema. Patient does have large abdomen, but no fluid wave, no murphy's sign, no tenderness in RUQ, more epigastric  tenderness; no acute concern for ascites, but RUQ [place] pending due to hyperbilirubinemia (see below). Will obtain urine sodium and urine osmolality to further work up cause of hyponatremia, and will obtain HIV. At last admission Hep C Ab PCR returned positive. DDX includes hypotonic hypovolemia, SIADH, hypothyroidism, hypopituitarism (patient on glucocorticoids currently). Consider obtaining TSH. EKG with normal QTc 447, will give ondansetron for anti-emetic. Plan to obtain blood and urine cultures, replete na with NS IVF at 119m/hr.  -Admit to FPTS, med-surg, attending Dr. CErin Hearing-Blood and urine cultures -IV Protonix -F/U Ur sodium, osmolality -F/U HIV, repeat Hep C Ab -IVF NS 15757mhr -ondansetron 57m56m6h PRN -protonix 40 mg -Up with assistance -PT/OT -Lovenox for DVT ppx -Carb modified diet  Hyperbilirubinemia  CT w/ nodular hepatic contour During last admission, patient was found to have mildly elevated ALT to 46 that subsequently corrected to normal as well as a nodular hepatic contour on CT concerning for hepatitis. Hepatitis panels obtained, Hep A negative, Hep B negative and showing no immunity (should receive vaccine at some point), but hepatitis c ab had not resulted. Results are available today, and ab elevated to 11, but results inconclusive. AST, ALT, and alkaline phosphatase WNL. Total bilirubin elevated to 1.7, 0.9 on 3/11.  - Daily hepatic panel - RUQ US Koreaf/u repeat Hep C Ab - Fractionated bili  DMT2 Patient diagnosed this year, A1c last week 8%. Home medications include lispro 5-10U before meals. -CBG q4h while not tolerating PO -carb modified diet -sSSI  RA  Myositis Patient [facility] for bilateral lower extremity weakness that resolved and is no longer present. [facility], but niece showed me the discharge instructions  the patient received that had ICD-10  codes for RA and myositis. Niece reports that patient had a biopsy of her thigh during that admission. Patient is on prednisone taper that is likely treating for this. Will attempt to obtain more records during this admission to clarify taper instructions and history. 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 No focal deficits appreciated on exam. Home medication lipitor 40 mg, ASA 880m - Restart home medications if able to tolerate with anti-emetic  FEN/GI: carb modified, protonix Prophylaxis: lovenox  Disposition: admit to med-surg pending medical work up, [facility] [place] [facility]  History of Present Illness:  Tracey Riddle a [age].o. female presenting with nausea/vomiting, upper abdominal pain. Patient felt good on Sunday at discharge, on Monday patient began having nausea, vomiting, and epigastric pain. She has not had a bowel movement since discharge. She has had a small headache today, and a dry cough since discharge, no CP or SOB, no rashes or joint swelling. [name], niece, states that patient has not been able to keep food and water down, but she completed course of levaquin as prescribed.   Review Of Systems: Per HPI with the following additions:   Review of Systems  Constitutional: Negative for chills, diaphoresis and fever.  HENT: Negative for congestion, sinus pain and sore throat.   Eyes: Negative for blurred vision.  Respiratory: Positive for cough. Negative for sputum production and wheezing.        Patient reports dry cough that developed at end of admission last weekend  Cardiovascular: Negative for chest pain.  Gastrointestinal: Positive for abdominal pain, constipation, heartburn, nausea and vomiting. Negative for diarrhea.  Genitourinary: Negative  for dysuria, flank pain, frequency, hematuria and urgency.  Musculoskeletal: Negative for myalgias.  Skin: Negative for rash.  Neurological: Positive for headaches. Negative for dizziness and focal weakness.    Patient Active Problem List   Diagnosis Date Noted  . Diarrhea 08/27/2019  . Hyponatremia 08/27/2019  . Pyuria 08/27/2019  . Abnormal liver CT 08/27/2019  . Diabetes mellitus type II, controlled ([facility] [place]/04/2020  . Pseudomonas sepsis ([facility]/04/2020  . Bacteremia due to Escherichia coli 08/27/2019  . Hypoproteinemia ([facility]/04/2020  . Thrombocytopenia ([facility]/04/2020  . Long-term corticosteroid use 08/27/2019    Past Medical History: Past Medical History:  Diagnosis Date  . Abnormal liver CT 08/27/2019   CT AP [facility] Mildly nodular hepatic contour c/w possible mild cirrhosis.  . Diabetes mellitus type II, controlled ([facility]/04/2020    Past Surgical History: History reviewed. No pertinent surgical history.  Social History: Social History   Tobacco Use  . Smoking status: Not on file  Substance Use Topics  . Alcohol use: Not on file  . Drug use: Not on file   Additional social history: [place], nieces care for her. Survivor of [place], lost all of her children. Please also refer to relevant sections of EMR.  Family History: No family history on file.  Allergies and Medications: No Known Allergies No current facility-administered medications on file prior to encounter.   Current Outpatient Medications on File Prior to Encounter  Medication Sig Dispense Refill  . acetaminophen (TYLENOL) 500 MG tablet Take 1,000 mg by mouth every 6 (six) hours as needed  for mild pain.    Marland Kitchen aspirin EC 81 MG EC tablet Take 1 tablet (81 mg total) by mouth daily. 30 tablet 0  . atorvastatin (LIPITOR) 40 MG tablet Take 1 tablet (40 mg total) by mouth daily at 6 PM. 30 tablet 0  . furosemide (LASIX) 20 MG tablet Take 20 mg  by mouth daily.    . insulin lispro (HUMALOG) 100 UNIT/ML KwikPen Inject 5-10 Units into the skin 2 (two) times daily before a meal. Per scale : 200-300 = 5 units, 300-400= 10 units    . levofloxacin (LEVAQUIN) 750 MG tablet Take 1 tablet (750 mg total) by mouth daily for 7 days. 7 tablet 0  . metoprolol tartrate (LOPRESSOR) 25 MG tablet Take 25 mg by mouth daily.    . pantoprazole (PROTONIX) 40 MG tablet Take 40 mg by mouth daily.    . predniSONE (DELTASONE) 20 MG tablet Take 40 mg by mouth daily.     . TOVIAZ 8 MG TB24 tablet Take 8 mg by mouth daily.    . blood glucose meter kit and supplies KIT Dispense based on patient and insurance preference. Use up to four times daily as directed. (FOR ICD-9 250.00, 250.01). 1 each 0    Objective: BP 124/77   Pulse (!) 114   Temp 97.9 F (36.6 C) (Oral)   Resp 20   SpO2 96%  Exam: General: older African woman, tired and ill appearing, resting in bed, NAD Eyes: anicteric sclerae ENTM: clear oropharynx Neck: supple Cardiovascular: mildly elevated rate, regular rhythm, no m/r/g Respiratory: CTAB, no increased WOB, no wheezes, rales, or rhonchi Gastrointestinal: soft, tender to palpation in epigastric region, ND, normal bowel sounds present MSK: warm, dry, no edema Derm: no rashes or lesions Neuro: no focal deficits, AOx4 Psych: normal mood, full affect  Labs and Imaging: CBC BMET  Recent Labs  Lab 09/04/19 0026  WBC 7.3  HGB 12.9  HCT 37.3  PLT 205   Recent Labs  Lab 09/04/19 0026  NA 123*  K 4.5  CL 88*  CO2 21*  BUN 19  CREATININE 1.04*  GLUCOSE 195*  CALCIUM 8.1*     EKG: EKG Interpretation  Date/Time:  Friday September 04 2019 02:29:29 EDT Ventricular Rate:  115 PR Interval:    QRS Duration: 77 QT Interval:  323 QTC Calculation: 447 R Axis:   18 Text Interpretation: Sinus tachycardia Abnormal R-wave progression, early transition Borderline T wave abnormalities No significant change since last tracing Confirmed by  Addison Lank [phone]) on 09/04/2019 3:25:17 AM  No results found.  Gladys Damme, MD 09/04/2019, 3:01 AM PGY-1, [place] Intern pager: [phone], text pages welcome   FPTS Upper-Level Resident Addendum   I have independently interviewed and examined the patient. I have discussed the above with the original author and agree with their documentation. My edits for correction/addition/clarification are in blue. Please see also any attending notes.    Milus Banister, DO PGY-2, [facility] 09/04/2019 5:42 AM  FPTS Service pager: [phone] (text pages welcome through [employer] [facility])

## 2019-09-04 NOTE — ED Provider Notes (Signed)
[facility] EMERGENCY DEPARTMENT Provider Note   CSN: [id number] Arrival date & time: 09/03/19  2329     History Chief Complaint  Patient presents with  . Abdominal Pain    Tracey Riddle is a 69 y.o. female.  Patient to ED with daughter for abdominal pain, vomiting. Per daughter, the patient was formerly very independent, living alone in [place]. She contracted COVID late last year, recovered. She became ill again in February and was admitted to [facility] per daughter, but she can only say she was diagnosed a diabetic at that time. Soon after her admission at [facility] (d/ch 08/07/19 per chart review) she became ill again and was admitted to [facility] with ESBL UTI. She did well on antibiotics in the hospital, went home with daughter and the day after admission started vomiting again. No fever, cough, congestion or diarrhea. Three days ago, she started having pain across her upper abdomen. She has not had a bowel movement in several days but patient reports she continues to pass flatus. She has been unable to tolerate any significant amount of food or drink in the last 4 days. No hematemesis. No previous abdominal surgeries.   The history is provided by the patient. A language interpreter was used (Daughter at bedside acting as interpreter).  Abdominal Pain Associated symptoms: nausea and vomiting   Associated symptoms: no chest pain, no chills, no diarrhea, no fever and no shortness of breath        Past Medical History:  Diagnosis Date  . 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  . Diabetes mellitus type II, controlled ([place]) 08/27/2019    Patient Active Problem List   Diagnosis Date Noted  . Diarrhea 08/27/2019  . Hyponatremia 08/27/2019  . Pyuria 08/27/2019  . Abnormal liver CT 08/27/2019  . Diabetes mellitus type II, controlled ([place]) 08/27/2019  . Pseudomonas sepsis ([place])  08/27/2019  . Bacteremia due to Escherichia coli 08/27/2019  . Hypoproteinemia ([place]) 08/27/2019  . Thrombocytopenia ([place]) 08/27/2019  . Long-term corticosteroid use 08/27/2019    History reviewed. No pertinent surgical history.   OB History   No obstetric history on file.     No family history on file.  Social History   Tobacco Use  . Smoking status: Not on file  Substance Use Topics  . Alcohol use: Not on file  . Drug use: Not on file    Home Medications Prior to Admission medications   Medication Sig Start Date End Date Taking? Authorizing Provider  acetaminophen (TYLENOL) 500 MG tablet Take 1,000 mg by mouth every 6 (six) hours as needed for mild pain.    [provider]  aspirin EC 81 MG EC tablet Take 1 tablet (81 mg total) by mouth daily. 08/31/19   Anderson, Chelsey L, DO  atorvastatin (LIPITOR) 40 MG tablet Take 1 tablet (40 mg total) by mouth daily at 6 PM. 08/30/19   Anderson, Chelsey L, DO  blood glucose meter kit and supplies KIT Dispense based on patient and insurance preference. Use up to four times daily as directed. (FOR ICD-9 250.00, 250.01). 08/30/19   Doristine Mango L, DO  insulin lispro (HUMALOG) 100 UNIT/ML KwikPen Inject 5-10 Units into the skin 2 (two) times daily before a meal. Per scale : 200-300 = 5 units, 300-400= 10 units 08/07/19   [provider]  levofloxacin (LEVAQUIN) 750 MG tablet Take 1 tablet (750 mg total) by  mouth daily for 7 days. 08/30/19 09/06/19  Ouida Sills, Chelsey L, DO  pantoprazole (PROTONIX) 40 MG tablet Take 40 mg by mouth daily. 08/07/19   [provider]  predniSONE (DELTASONE) 20 MG tablet Take 40 mg by mouth daily. On week 2 = 40 mg Q D for 2 weeks started on 08-24-19 08/07/19   [provider]  TOVIAZ 8 MG TB24 tablet Take 8 mg by mouth daily. 08/07/19   [provider]    Allergies    Patient has no known allergies.  Review of Systems   Review of Systems  Constitutional: Positive for  activity change and appetite change. Negative for chills and fever.  HENT: Negative.   Respiratory: Negative.  Negative for shortness of breath.   Cardiovascular: Negative.  Negative for chest pain.  Gastrointestinal: Positive for abdominal pain, nausea and vomiting. Negative for diarrhea.  Musculoskeletal: Negative.   Skin: Negative.   Neurological: Positive for weakness.    Physical Exam Updated Vital Signs BP 124/77   Pulse (!) 114   Temp 97.9 F (36.6 C) (Oral)   Resp 20   SpO2 96%   Physical Exam Vitals and nursing note reviewed.  Constitutional:      Appearance: She is well-developed.  HENT:     Head: Normocephalic.  Cardiovascular:     Rate and Rhythm: Regular rhythm. Tachycardia present.     Heart sounds: No murmur.  Pulmonary:     Effort: Pulmonary effort is normal.     Breath sounds: Normal breath sounds. No wheezing, rhonchi or rales.  Abdominal:     General: Bowel sounds are decreased. There is distension.     Palpations: Abdomen is soft.     Tenderness: There is abdominal tenderness in the right upper quadrant, epigastric area, suprapubic area and left upper quadrant. There is no guarding or rebound.  Musculoskeletal:        General: Normal range of motion.     Cervical back: Normal range of motion and neck supple.  Skin:    General: Skin is warm and dry.     Findings: No rash.  Neurological:     Mental Status: She is alert and oriented to person, place, and time.     ED Results / Procedures / Treatments   Labs (all labs ordered are listed, but only abnormal results are displayed) Labs Reviewed  CBC - Abnormal; Notable for the following components:      Result Value   nRBC 0.3 (*)    All other components within normal limits  LIPASE, BLOOD  COMPREHENSIVE METABOLIC PANEL  URINALYSIS, ROUTINE W REFLEX MICROSCOPIC    EKG None  Radiology No results found.  Procedures Procedures (including critical care time)  Medications Ordered in  ED Medications  ondansetron (ZOFRAN) injection 4 mg (has no administration in time range)  HYDROmorphone (DILAUDID) injection 0.5 mg (has no administration in time range)  sodium chloride flush (NS) 0.9 % injection 3 mL (3 mLs Intravenous Given 09/04/19 0025)  sodium chloride 0.9 % bolus 1,000 mL (1,000 mLs Intravenous New Bag/Given 09/04/19 0108)    ED Course  I have reviewed the triage vital signs and the nursing notes.  Pertinent labs & imaging results that were available during my care of the patient were reviewed by me and considered in my medical decision making (see chart for details).    MDM Rules/Calculators/A&P  Patient to ED with N, V, generalized weakness. Recent admission with culture positive for ESBL E. Coli discharged on Levaquin, which she has been compliant with. Per daughter, she has been unable to eat or drink for several days. No fever.   The patient is ill appearing but nontoxic. She is tachycardia, afebrile, normotensive. She is mentating well. Daughter at bedside acting as interpreter.   Labs significant for profound hyponatremia of 123. UA pending, however, with symptoms of profuse nausea/vomiting, the patient will require re-admission for further management. Patient and daughter updated.   Abdominal exam essentially benign with diffuse tenderness, soft. Imaging not felt to be indicated at this time.  She has received a single 1000 ml bolus NS, pending a second. No vomiting. Medications seem to be helping with symptoms.   Family medicine paged for bounce back admission.  Final Clinical Impression(s) / ED Diagnoses Final diagnoses:  None   1. Hyponatremia 2. Nausea, vomiting   Rx / DC Orders ED Discharge Orders    None       Charlann Lange, PA-C 09/04/19 0246    Fatima Blank, MD 09/04/19 [phone]

## 2019-09-04 NOTE — ED Notes (Signed)
Admin 81mg  aspirin PO with small sip of water with permission from MD. Pt tolerated well, reported no nausea. Pt currently transporting to CT.

## 2019-09-04 NOTE — Progress Notes (Addendum)
Family Medicine Teaching Service Daily Progress Note Intern Pager: [phone]  Patient name: Tracey Riddle Medical record number: [id number] Date of birth: 1950-09-07 Age: 69 y.o. Gender: female  Primary Care Provider: System, Pcp Not In Consultants: GI Code Status: full  Pt Overview and Major Events to Date:  3/19- admitted  Assessment and Plan: Tracey Riddle is a 69 y.o. female presenting with intractable abdominal pain and nausea. 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 Reflex quant was ordered but not able to result due to sample size. Hep A negative, Hep B surface Ab neg. RUQ [place] today showed cirrhotic liver with lobulated surface, no mass or ascites. Negative gallbladder. AST, ALT wnl. Total bili 1.7>1.5, direct= 0.3, indirect 1.2 - reorder hepC quant - order hepC genotype - consult GI- will f/u OP - continue IV protonix - nausea prophylaxis - hep B vaccine prior to dc? - consider repeating abdominal CT  - keep NPO  Bacteremia- last admission- patient had pan-sensitive e coli bacteremia and sent home with 7 day course of Levaquin which was started on Monday 3/15 so should finish Sunday. Since she is not able to take PO, will convert to IV equivalent. She also had urine culture positive for pseudomonas/ESBL which was treated with meropenem while admitted. Urinalysis this admission neg for infection - levaquin (3/15-3/18) - cefepime (3/19-3/21) - f/u urine culture, blood culture  Hyponatremia- Na+ 123 - NS 157mL - frequent neuro checks - BMP 1700 - strict I/Os  DMT2- CBG 195 Patient diagnosed this year, A1c 8. Home medications include lispro 5-10U before meals. -CBG q4h while not tolerating PO -carb modified diet -sSSI  H/o RA  Myositis -Restart prednisone 40mg  daily if patient able to tolerate PO with anti-emetic - in the meantime, can do IV methylpred and begin slow taper  H/O TIA: lipid  panel wnl except HDL 32. Home medication lipitor 40 mg, ASA 81mg . - Restart home medications if able to tolerate with anti-emetic  Constipation- although patient had diarrhea at her last admission, she has not had a BM since discharge 5 days ago.  - miralax daily  FEN/GI: NPO, protonix Prophylaxis: lovenox  Disposition: pending treatment of intractable vomiting  Subjective:  Patient is quiet and not feeling well. Niece present and gives most of history.   Objective: Temp:  [97.9 F (36.6 C)] 97.9 F (36.6 C) (03/19 0023) Pulse Rate:  [104-116] 105 (03/19 0730) Resp:  [12-25] 13 (03/19 0730) BP: (109-128)/(62-78) 109/62 (03/19 0730) SpO2:  [94 %-97 %] 94 % (03/19 0730) Physical Exam: General: NAD  Laboratory: Recent Labs  Lab 08/29/19 0837 08/30/19 0156 09/04/19 0026  WBC 4.5 6.1 7.3  HGB 11.1* 11.8* 12.9  HCT 31.8* 34.5* 37.3  PLT 125* 140* 205   Recent Labs  Lab 08/29/19 0837 08/30/19 0156 09/04/19 0026  NA 132* 129* 123*  K 3.1* 4.5 4.5  CL 102 101 88*  CO2 21* 18* 21*  BUN 7* 8 19  CREATININE 0.74 0.70 1.04*  CALCIUM 7.8* 7.9* 8.1*  PROT  --  6.0* 5.8*  BILITOT  --  1.4* 1.7*  ALKPHOS  --  73 69  ALT  --  35 37  AST  --  28 33  GLUCOSE 123* 201* 195*   hepC quant hepC genotype Blood culture TSH covid screening Bilirubin fractionated Urine osmolality  Imaging/Diagnostic Tests: US Abdomen Limited RUQ  Result Date: 09/04/2019 CLINICAL DATA:  Hepatitis-C EXAM:  ULTRASOUND ABDOMEN LIMITED RIGHT UPPER QUADRANT COMPARISON:  Abdominal CT from 9 days ago FINDINGS: Gallbladder: No gallstones or wall thickening visualized. No sonographic Murphy sign noted by sonographer. Common bile duct: Diameter: 5 mm Liver: Echogenic liver with lobulated surface. There is fissure widening and caudate lobe hypertrophy on prior CT. No evident mass lesion, but limited by the degree of acoustic penetration. Portal vein is patent on color Doppler imaging with normal direction  of blood flow towards the liver. IMPRESSION: 1. Cirrhotic liver without visible mass or ascites. 2. Negative gallbladder. Electronically Signed   By: Marnee Spring M.D.   On: 09/04/2019 04:37     Leeroy Bock, DO 09/04/2019, 7:33 AM PGY-2, [facility] FPTS Intern pager: [phone], text pages welcome

## 2019-09-04 NOTE — ED Provider Notes (Signed)
Attestation: Medical screening examination/treatment/procedure(s) were conducted as a shared visit with non-physician practitioner(s) and myself.  I personally evaluated the patient during the encounter.   Briefly, the patient is a 69 y.o. female with h/o DM, recent COVID, currently being treated for UTI, here for N/V and decreased oral tolerance.   Vitals:   09/04/19 0330 09/04/19 0345  BP: 125/68 119/63  Pulse: (!) 107 (!) 106  Resp: 18 16  Temp:    SpO2: 95% 94%    CONSTITUTIONAL:  nontoxic-appearing, NAD NEURO:  Alert and oriented x 3, no focal deficits EYES:  pupils equal and reactive ENT/NECK:  trachea midline, no JVD CARDIO:  tachy rate, reg rhythm, well-perfused PULM:  None labored breathing GI/GU:  Abdomin non-distended MSK/SPINE:  No gross deformities, no edema SKIN:  no rash, atraumatic PSYCH:  Appropriate speech and behavior   EKG Interpretation  Date/Time:  Friday September 04 2019 02:29:29 EDT Ventricular Rate:  115 PR Interval:    QRS Duration: 77 QT Interval:  323 QTC Calculation: 447 R Axis:   18 Text Interpretation: Sinus tachycardia Abnormal R-wave progression, early transition Borderline T wave abnormalities No significant change since last tracing Confirmed by Drema Pry [id number]) on 09/04/2019 3:25:17 AM       Work up notable for hyponatremia.. Admitted to FM     Nomar Broad, Amadeo Garnet, MD 09/04/19 [phone]

## 2019-09-04 NOTE — ED Notes (Deleted)
Breakfast ordered 

## 2019-09-04 NOTE — Consult Note (Signed)
[facility] Consultation Note  Referring Provider: Family Medicine Teaching Service Primary Care Physician:  System, Pcp Not In  Reason for Consultation:  Abdominal pain, nausea, vomiting  HPI: Tracey Riddle is a 69 y.o. female admitted for above reasons.  Recent admission for presumed gastroenteritis with positive urine and blood cultures.  Fine after discharge, but symptoms recurred within a few days of discharge.  Current symptoms include nausea and vomiting (constant, some improvement now with ondansetron) and epigastric pain (constant).  Unable to tolerate peroral diet.  Dark-colored emesis.  No melena or hematochezia.  No bowel movement in several days.  Recent ultrasound and CT scan negative other than changes of possible mild cirrhosis.  No NSAIDs other than ASA 81/day.  No known prior EGD.   Past Medical History:  Diagnosis Date  . 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  . Diabetes mellitus type II, controlled ([place]) 08/27/2019    History reviewed. No pertinent surgical history.  Prior to Admission medications   Medication Sig Start Date End Date Taking? Authorizing Provider  acetaminophen (TYLENOL) 500 MG tablet Take 1,000 mg by mouth every 6 (six) hours as needed for mild pain.   Yes [provider]  aspirin EC 81 MG EC tablet Take 1 tablet (81 mg total) by mouth daily. 08/31/19  Yes Anderson, Chelsey L, DO  atorvastatin (LIPITOR) 40 MG tablet Take 1 tablet (40 mg total) by mouth daily at 6 PM. 08/30/19  Yes Anderson, Chelsey L, DO  furosemide (LASIX) 20 MG tablet Take 20 mg by mouth daily. 09/02/19  Yes [provider]  insulin lispro (HUMALOG) 100 UNIT/ML KwikPen Inject 5-10 Units into the skin 2 (two) times daily before a meal. Per scale : 200-300 = 5 units, 300-400= 10 units 08/07/19  Yes [provider]  levofloxacin (LEVAQUIN) 750 MG tablet Take 1 tablet (750  mg total) by mouth daily for 7 days. 08/30/19 09/06/19 Yes Anderson, Chelsey L, DO  metoprolol tartrate (LOPRESSOR) 25 MG tablet Take 25 mg by mouth daily. 09/02/19  Yes [provider]  pantoprazole (PROTONIX) 40 MG tablet Take 40 mg by mouth daily. 08/07/19  Yes [provider]  predniSONE (DELTASONE) 20 MG tablet Take 40 mg by mouth daily.  08/07/19  Yes [provider]  TOVIAZ 8 MG TB24 tablet Take 8 mg by mouth daily. 08/07/19  Yes [provider]  blood glucose meter kit and supplies KIT Dispense based on patient and insurance preference. Use up to four times daily as directed. (FOR ICD-9 250.00, 250.01). 08/30/19   Doristine Mango L, DO    Current Facility-Administered Medications  Medication Dose Route Frequency Provider Last Rate Last Admin  . 0.9 %  sodium chloride infusion   Intravenous Continuous Anderson, Chelsey L, DO 100 mL/hr at 09/04/19 0832 New Bag at 09/04/19 6237  . aspirin EC tablet 81 mg  81 mg Oral Daily Milus Banister C, DO   81 mg at 09/04/19 1057  . ceFEPIme (MAXIPIME) 2 g in sodium chloride 0.9 % 100 mL IVPB  2 g Intravenous Q12H Werner Lean, [facility]   Stopped at 09/04/19 1144  . enoxaparin (LOVENOX) injection 40 mg  40 mg Subcutaneous Q24H Milus Banister C, DO   40 mg at 09/04/19 1321  . insulin aspart (novoLOG) injection 0-9 Units  0-9 Units Subcutaneous Q4H Meccariello, Bernita Raisin, DO   1 Units at 09/04/19 1322  . methylPREDNISolone  sodium succinate (SOLU-MEDROL) 40 mg/mL injection 32 mg  32 mg Intravenous Daily Anderson, Chelsey L, DO   32 mg at 09/04/19 1045  . ondansetron (ZOFRAN) tablet 4 mg  4 mg Oral Q6H PRN Milus Banister C, DO       Or  . ondansetron [facility]) injection 4 mg  4 mg Intravenous Q6H PRN Milus Banister C, DO   4 mg at 09/04/19 1329  . pantoprazole (PROTONIX) injection 40 mg  40 mg Intravenous QHS Milus Banister C, DO        Allergies as of 09/03/2019  . (No Known Allergies)    No family history on  file.  Social History   Socioeconomic History  . Marital status: Single    Spouse name: Not on file  . Number of children: Not on file  . Years of education: Not on file  . Highest education level: Not on file  Occupational History  . Not on file  Tobacco Use  . Smoking status: Not on file  Substance and Sexual Activity  . Alcohol use: Not on file  . Drug use: Not on file  . Sexual activity: Not on file  Other Topics Concern  . Not on file  Social History Narrative  . Not on file   Social Determinants of Health   Financial Resource Strain:   . Difficulty of Paying Living Expenses:   Food Insecurity:   . Worried About Charity fundraiser in the Last Year:   . Arboriculturist in the Last Year:   Transportation Needs:   . Film/video editor (Medical):   Marland Kitchen Lack of Transportation (Non-Medical):   Physical Activity:   . Days of Exercise per Week:   . Minutes of Exercise per Session:   Stress:   . Feeling of Stress :   Social Connections:   . Frequency of Communication with Friends and Family:   . Frequency of Social Gatherings with Friends and Family:   . Attends Religious Services:   . Active Member of Clubs or Organizations:   . Attends Archivist Meetings:   Marland Kitchen Marital Status:   Intimate Partner Violence:   . Fear of Current or Ex-Partner:   . Emotionally Abused:   Marland Kitchen Physically Abused:   . Sexually Abused:     Review of Systems: As per HPI, all others negative  Physical Exam: Vital signs in last 24 hours: Temp:  [97.9 F (36.6 C)-98.1 F (36.7 C)] 98.1 F (36.7 C) (03/19 1200) Pulse Rate:  [103-117] 110 (03/19 1200) Resp:  [12-25] 16 (03/19 1200) BP: (107-128)/(62-78) 121/73 (03/19 1200) SpO2:  [94 %-97 %] 97 % (03/19 1200) Last BM Date: 08/31/19 General:   Alert,  Somnolent but arousable, cooperative in NAD Head:  Normocephalic and atraumatic. Eyes:  Sclera clear, no icterus.   Conjunctiva pink. Ears:  Normal auditory acuity. Nose:  No  deformity, discharge,  or lesions. Mouth:  No deformity or lesions.  Oropharynx pink & moist. Neck:  Supple; no masses or thyromegaly. Lungs:  Clear throughout to auscultation.   No wheezes, crackles, or rhonchi. No acute distress. Heart:  Regular rate and rhythm; no murmurs, clicks, rubs,  or gallops. Abdomen:  Soft, mild epigastric tenderness without peritonitis. No masses, hepatosplenomegaly or hernias noted. Normal bowel sounds, without guarding, and without rebound.     Msk:  Symmetrical without gross deformities. Normal posture. Pulses:  Normal pulses noted. Extremities:  Without clubbing or edema. Neurologic:  Alert and  oriented x4;  grossly normal neurologically. Skin:  Intact without significant lesions or rashes. Psych:  Alert and cooperative. Normal mood and affect.   Lab Results: Recent Labs    09/04/19 0026  WBC 7.3  HGB 12.9  HCT 37.3  PLT 205   BMET Recent Labs    09/04/19 0026  NA 123*  K 4.5  CL 88*  CO2 21*  GLUCOSE 195*  BUN 19  CREATININE 1.04*  CALCIUM 8.1*   LFT Recent Labs    09/04/19 0026 09/04/19 0026 09/04/19 0759  PROT 5.8*  --   --   ALBUMIN 2.4*  --   --   AST 33  --   --   ALT 37  --   --   ALKPHOS 69  --   --   BILITOT 1.7*   < > 1.5*  BILIDIR  --   --  0.3*  IBILI  --   --  1.2*   < > = values in this interval not displayed.   PT/INR No results for input(s): LABPROT, INR in the last 72 hours.  Studies/Results: CT ABDOMEN PELVIS W CONTRAST  Result Date: 09/04/2019 CLINICAL DATA:  Abdominal pain and vomiting EXAM: CT ABDOMEN AND PELVIS WITH CONTRAST TECHNIQUE: Multidetector CT imaging of the abdomen and pelvis was performed using the standard protocol following bolus administration of intravenous contrast. CONTRAST:  110m OMNIPAQUE IOHEXOL 300 MG/ML  SOLN COMPARISON:  Multiple exams, including 08/26/2019 and abdominal ultrasound of 09/04/2019 FINDINGS: Lower chest: Dependent atelectasis in both lungs. Atherosclerotic  calcification of portions of the thoracic aorta. Mild cardiomegaly. Hepatobiliary: Hepatic morphology favors cirrhosis. No appreciable enhancing hepatic mass. Contracted gallbladder. No significant biliary dilatation. Pancreas: Unremarkable Spleen: Unremarkable Adrenals/Urinary Tract: 1.0 cm hypodense lesion of the left kidney upper pole is stable and likely a cyst although technically too small to characterize. The adrenal glands appear normal. Urinary bladder unremarkable. Stomach/Bowel: No dilated bowel. Normal appendix. Moderate prominence of fluid and food material in the stomach although this may be coincidental. Vascular/Lymphatic: Aortoiliac atherosclerotic vascular disease. No pathologic adenopathy observed. Reproductive: Unremarkable Other: No supplemental non-categorized findings. Musculoskeletal: Mild sclerosis along the iliac side of both sacroiliac joints, unchanged. Mild grade 1 degenerative retrolisthesis at L5-S1, with facet arthropathy potentially causing mild left foraminal stenosis at this level. Disc bulge at L4-5. Minimal anterior wedging at T5 and T6, likely chronic. IMPRESSION: 1. A specific cause for the patient's abdominal pain and vomiting is not identified. 2. Hepatic morphology favors cirrhosis. 3. Other imaging findings of potential clinical significance: Mild cardiomegaly. Mild sclerosis along the iliac side of both sacroiliac joints, possibly from mild chronic sacroiliitis. Disc bulge at L4-5. Mild left foraminal stenosis at L5-S1 due to facet arthropathy. Minimal anterior wedging at T5 and T6, likely chronic. Aortic Atherosclerosis (ICD10-I70.0). Electronically Signed   By: WVan ClinesM.D.   On: 09/04/2019 11:29   [place]domen Limited RUQ  Result Date: 09/04/2019 CLINICAL DATA:  Hepatitis-C EXAM: ULTRASOUND ABDOMEN LIMITED RIGHT UPPER QUADRANT COMPARISON:  Abdominal CT from 9 days ago FINDINGS: Gallbladder: No gallstones or wall thickening visualized. No sonographic Murphy  sign noted by sonographer. Common bile duct: Diameter: 5 mm Liver: Echogenic liver with lobulated surface. There is fissure widening and caudate lobe hypertrophy on prior CT. No evident mass lesion, but limited by the degree of acoustic penetration. Portal vein is patent on color Doppler imaging with normal direction of blood flow towards the liver. IMPRESSION: 1. Cirrhotic liver without visible mass or ascites. 2.  Negative gallbladder. Electronically Signed   By: Monte Fantasia M.D.   On: 09/04/2019 04:37   Impression:  1.  Nausea and vomiting. 2.  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 3.  Profound hyponatremia.  Cause or effect of Nausea/vomiting? 4.  Epigastric discomfort; U/S and CT negative. 5.  Constipation.  Plan:  1.  Dulcolax suppository. 2.  Continue PPI, increase to IV BID dosing. 3.  Start sucralfate 1 gram po qac/qhs. 4.  Treat hyponatremia; sodium level will need to be at least 130 for any consideration of endoscopy. 5.  [facility] will follow; if not better with above measures, would consider endoscopy early next week.   LOS: 0 days   Gavin Faivre M  09/04/2019, 2:26 PM  Cell [phone] If no answer or after 5 PM call [phone]

## 2019-09-05 LAB — HCV RNA QUANT
HCV Quantitative Log: 6.696 log10 IU/mL (ref >1.70)
HCV Quantitative: 4970000 IU/mL (ref >50)

## 2019-09-05 LAB — COMPREHENSIVE METABOLIC PANEL
ALT: 28 U/L (ref 0–44)
AST: 24 U/L (ref 15–41)
Albumin: 1.8 g/dL — ABNORMAL LOW (ref 3.5–5.0)
Alkaline Phosphatase: 52 U/L (ref 38–126)
Anion gap: 9 (ref 5–15)
BUN: 12 mg/dL (ref 8–23)
CO2: 19 mmol/L — ABNORMAL LOW (ref 22–32)
Calcium: 7.1 mg/dL — ABNORMAL LOW (ref 8.9–10.3)
Chloride: 102 mmol/L (ref 98–111)
Creatinine, Ser: 0.73 mg/dL (ref 0.44–1.00)
GFR calc Af Amer: >60 mL/min (ref >60)
GFR calc non Af Amer: >60 mL/min (ref >60)
Glucose, Bld: 150 mg/dL — ABNORMAL HIGH (ref 70–99)
Potassium: 4.2 mmol/L (ref 3.5–5.1)
Sodium: 130 mmol/L — ABNORMAL LOW (ref 135–145)
Total Bilirubin: 1.3 mg/dL — ABNORMAL HIGH (ref 0.3–1.2)
Total Protein: 4.6 g/dL — ABNORMAL LOW (ref 6.5–8.1)

## 2019-09-05 LAB — GLUCOSE, CAPILLARY
Glucose-Capillary: 124 mg/dL — ABNORMAL HIGH (ref 70–99)
Glucose-Capillary: 140 mg/dL — ABNORMAL HIGH (ref 70–99)
Glucose-Capillary: 148 mg/dL — ABNORMAL HIGH (ref 70–99)
Glucose-Capillary: 153 mg/dL — ABNORMAL HIGH (ref 70–99)
Glucose-Capillary: 179 mg/dL — ABNORMAL HIGH (ref 70–99)
Glucose-Capillary: 248 mg/dL — ABNORMAL HIGH (ref 70–99)

## 2019-09-05 LAB — CBC
HCT: 29.3 % — ABNORMAL LOW (ref 36.0–46.0)
Hemoglobin: 10 g/dL — ABNORMAL LOW (ref 12.0–15.0)
MCH: 33.3 pg (ref 26.0–34.0)
MCHC: 34.1 g/dL (ref 30.0–36.0)
MCV: 97.7 fL (ref 80.0–100.0)
Platelets: 124 10*3/uL — ABNORMAL LOW (ref 150–400)
RBC: 3 MIL/uL — ABNORMAL LOW (ref 3.87–5.11)
RDW: 13 % (ref 11.5–15.5)
WBC: 5.5 10*3/uL (ref 4.0–10.5)
nRBC: 0.4 % — ABNORMAL HIGH (ref 0.0–0.2)

## 2019-09-05 LAB — URINE CULTURE: Culture: 60000 — AB

## 2019-09-05 NOTE — Evaluation (Signed)
Occupational Therapy Evaluation Patient Details Name: Tracey Riddle MRN: [id number] DOB: April 14, 1951 Today's Date: 09/05/2019    History of Present Illness Pt is a 69 y/o female admitted secondary to worsening abdominal pain. Found to have hyponatremia. Pt with recent admission for sepsis from viral gastroenteritis. PMH includes CKD, DM, and TIA.    Clinical Impression   PTA pt living with niece, was recently in rehab, but as of late has developed extreme weakness. Niece present for translation since pt dialect not available via tele interpreter. At time of eval, pt able to complete bed mobility with min A and sit <> stand with min A +2. Pt is deconditioned with poor activity tolerance. HR up to 120 with single sit <> stand with small side steps up in bed. Pt requested to return to bed after this. Educated pt, niece, and Engineer, manufacturing on importance of pt sitting up for meals and using BSC. Niece states that SNF is not an option for them as a d/c plan and they would like her to go home. Pt will need max HH services. Will continue to follow per POC listed below.    Follow Up Recommendations  Home health OT;Supervision/Assistance - 24 hour;Other (comment)(family refusing SNF as option; aide would also be helpful)    Equipment Recommendations  3 in 1 bedside commode;Wheelchair (measurements OT);Wheelchair cushion (measurements OT)    Recommendations for Other Services       Precautions / Restrictions Precautions Precautions: Fall Restrictions Weight Bearing Restrictions: No      Mobility Bed Mobility Overal bed mobility: Needs Assistance Bed Mobility: Supine to Sit     Supine to sit: Min assist     General bed mobility comments: minA to facilitate movement of BLE to EOB, pt then able to reach with minA and VCs to use bed rails and elevated HOB to raise trunk to sitting EOB.  Transfers Overall transfer level: Needs assistance Equipment used: Rolling walker (2 wheeled) Transfers: Sit  to/from Stand Sit to Stand: Min assist;+2 physical assistance         General transfer comment: min A +2 to rise and steady with RW and minor dizziness that improved    Balance Overall balance assessment: Needs assistance Sitting-balance support: Single extremity supported;Feet supported Sitting balance-Leahy Scale: Good Sitting balance - Comments: supervision   Standing balance support: Bilateral upper extremity supported Standing balance-Leahy Scale: Poor Standing balance comment: reliant on external support and therapist assist                           ADL either performed or assessed with clinical judgement   ADL Overall ADL's : Needs assistance/impaired Eating/Feeding: Set up;Sitting   Grooming: Set up;Sitting   Upper Body Bathing: Minimal assistance;Sitting   Lower Body Bathing: Maximal assistance;Sit to/from stand;Sitting/lateral leans   Upper Body Dressing : Minimal assistance;Sitting   Lower Body Dressing: Maximal assistance;Sit to/from stand;Sitting/lateral leans   Toilet Transfer: Minimal assistance;+2 for physical assistance;+2 for safety/equipment;Stand-pivot;BSC;RW Toilet Transfer Details (indicate cue type and reason): simulated at EOB, pt fatiguing easily Toileting- Clothing Manipulation and Hygiene: Minimal assistance;Sit to/from stand   Tub/ Engineer, structural: Moderate assistance;Ambulation;Shower seat   Functional mobility during ADLs: Minimal assistance;+2 for physical assistance;+2 for safety/equipment;Rolling walker(side steps up in bed only) General ADL Comments: pt limited by generalized weakness and decreased activity tolerance for safe and independent BADLs     Vision Patient Visual Report: No change from baseline  Perception     Praxis      Pertinent Vitals/Pain Pain Assessment: Faces Faces Pain Scale: Hurts a little bit Pain Location: abdomen Pain Descriptors / Indicators: Grimacing Pain Intervention(s): Limited  activity within patient's tolerance;Monitored during session     Hand Dominance     Extremity/Trunk Assessment Upper Extremity Assessment Upper Extremity Assessment: Generalized weakness   Lower Extremity Assessment Lower Extremity Assessment: Generalized weakness       Communication Communication Communication: Prefers language other than English(Kinyarwanda; [place]- niece interpreted bc language not available on teleinterpreter)   Cognition Arousal/Alertness: Awake/alert Behavior During Therapy: Flat affect Overall Cognitive Status: Difficult to assess                                 General Comments: niece interpretting and stating pt is about baseline with cognition   General Comments  pt and neice prefer d/c home with continued services, understandable due to language barrier. discussed frequency of HHPT services available    Exercises     Shoulder Instructions      Home Living Family/patient expects to be discharged to:: Private residence Living Arrangements: Other relatives(niece) Available Help at Discharge: Family;Available 24 hours/day Type of Home: House Home Access: Stairs to enter [employer] of Steps: 3 Entrance Stairs-Rails: Right Home Layout: Two level;Able to live on main level with bedroom/bathroom     Bathroom Shower/Tub: Producer, television/film/video: Standard     Home Equipment: Environmental consultant - 2 wheels;Shower seat          Prior Functioning/Environment Level of Independence: Needs assistance  Gait / Transfers Assistance Needed: Pt's niece reports pt was ambulating with RW, however, since d/c from hospital has not been able to get out of bed.  ADL's / Homemaking Assistance Needed: Has needed assist with sponge bathing since discharge from hospital, prior to this was independent with BADLs            OT Problem List: Decreased strength;Decreased knowledge of use of DME or AE;Decreased knowledge of  precautions;Decreased activity tolerance;Impaired balance (sitting and/or standing);Pain      OT Treatment/Interventions: Self-care/ADL training;Therapeutic exercise;Patient/family education;Balance training;Energy conservation;Therapeutic activities;DME and/or AE instruction    OT Goals(Current goals can be found in the care plan section) Acute Rehab OT Goals Patient Stated Goal: to get stronger and feel better OT Goal Formulation: With patient Time For Goal Achievement: 09/19/19 Potential to Achieve Goals: Good  OT Frequency: Min 3X/week   Barriers to D/C:            Co-evaluation PT/OT/SLP Co-Evaluation/Treatment: Yes Reason for Co-Treatment: For patient/therapist safety;To address functional/ADL transfers PT goals addressed during session: Balance;Mobility/safety with mobility;Proper use of DME;Strengthening/ROM OT goals addressed during session: ADL's and self-care;Proper use of Adaptive equipment and DME;Strengthening/ROM      AM-PAC OT "6 Clicks" Daily Activity     Outcome Measure Help from another person eating meals?: None Help from another person taking care of personal grooming?: A Little Help from another person toileting, which includes using toliet, bedpan, or urinal?: A Lot Help from another person bathing (including washing, rinsing, drying)?: A Lot Help from another person to put on and taking off regular upper body clothing?: A Little Help from another person to put on and taking off regular lower body clothing?: A Lot 6 Click Score: 16   End of Session Equipment Utilized During Treatment: Gait belt;Rolling walker Nurse Communication: Mobility status  Activity Tolerance:  Patient tolerated treatment well Patient left: in bed;with call bell/phone within reach;with family/visitor present  OT Visit Diagnosis: Unsteadiness on feet (R26.81);Other abnormalities of gait and mobility (R26.89);Muscle weakness (generalized) (M62.81)                Time: 9507-2257 OT  Time Calculation (min): 26 min Charges:  OT General Charges $OT Visit: 1 Visit OT Evaluation $OT Eval Moderate Complexity: Early, MSOT, OTR/L [facility] Office Number: [phone] Pager: [phone]  Zenovia Jarred 09/05/2019, 1:59 PM

## 2019-09-05 NOTE — Progress Notes (Signed)
Family Medicine Teaching Service Daily Progress Note Intern Pager: [phone]  Patient name: Tracey Riddle Medical record number: [id number] Date of birth: 12-23-1950 Age: 69 y.o. Gender: female  Primary Care Provider: System, Pcp Not In Consultants: GI Code Status: full  Pt Overview and Major Events to Date:  3/19- admitted  Assessment and Plan: Tracey Riddle is a 69 y.o. female presenting with intractable abdominal pain and nausea. 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 On exam: generalized abdominal tenderness, no guarding, rebound tenderness. Bowel sounds present.  -GI following appreciate recommendations. Will plan for EGD on 09/07/19 for further evaluation.  -Continue PPI BID IV and sucralfate.   Hepatitis C HepC Ab from last admission 11. Reflex quant was ordered but not able to result due to sample size. Hep A negative, Hep B surface Ab neg.  RUQ JY:NWGNFAOZH liver with lobulated surface, no mass or ascites. Negative gallbladder.  AST, ALT wnl. Total bili 1.7>1.5>1.3, direct= 0.3, indirect 1.2. Quant Hep C: 4.5 million -Order hepC genotype -Consult GI- will f/u OP -Continue IV protonix -Nausea prophylaxis -Consider hep B vaccine prior to dc? -Consider repeating abdominal CT    Bacteremia Last admission- patient had pan-sensitive e coli bacteremia and sent home with 7 day course of Levaquin which was started on Monday 3/15 so should finish Sunday. Since she is not able to take PO, will convert to IV equivalent. She also had urine culture positive for pseudomonas/ESBL which was treated with meropenem while admitted. Urinalysis this admission neg for infection - levaquin (3/15-3/18) - cefepime (3/19-3/21) - F/u urine culture, blood culture  Hyponatremia Na 123>130 - NS 137mL - frequent neuro checks - BMP 1700 - strict I/Os  DMT2 CBG 248, 179. A1c 8. Home  medications include lispro 5-10U before meals. -CBG q4h while not tolerating PO -carb modified diet -sSSI  H/o RA  Myositis -Restart prednisone 40mg  daily if patient able to tolerate PO with anti-emetic - in the meantime, can do IV methylpred and begin slow taper  H/O TIA Lipid panel wnl except HDL 32. Home medication lipitor 40 mg, ASA 81mg . - Restart home medications if able to tolerate with anti-emetic  Constipation - miralax daily  FEN/GI: NPO, protonix Prophylaxis: lovenox  Disposition: pending treatment of intractable vomiting  Subjective:  Has on going nausea today., able to eat a small amount  Objective: Temp:  [98.1 F (36.7 C)-98.5 F (36.9 C)] 98.5 F (36.9 C) (03/20 0818) Pulse Rate:  [92-115] 112 (03/20 0818) Resp:  [18-20] 20 (03/20 0818) BP: (114-129)/(62-65) 129/64 (03/20 0818) SpO2:  [95 %-98 %] 97 % (03/20 0818)  Physical Exam: General: pleasant, unwell appearing 69 yr old female, alert  Cardio: Normal S1 and S2, RRR, No murmurs or rubs.   Pulm: Clear to auscultation bilaterally anterior, no crackles, wheezing, or diminished breath sounds. Normal respiratory effort Abdomen: Bowel sounds normal. Abdomen soft, generalized tenderness, no guarding.  Extremities: No peripheral edema. Warm/ well perfused.  Strong radial pulses Neuro: Cranial nerves grossly intact  Laboratory: Recent Labs  Lab 08/30/19 0156 09/04/19 0026 09/05/19 0328  WBC 6.1 7.3 5.5  HGB 11.8* 12.9 10.0*  HCT 34.5* 37.3 29.3*  PLT 140* 205 124*   Recent Labs  Lab 08/30/19 0156 08/30/19 0156 09/04/19 0026 09/04/19 0759 09/04/19 1700 09/05/19 0328  NA 129*   < > 123*  --  129* 130*  K 4.5   < > 4.5  --  4.4 4.2  CL 101   < > 88*  --  98 102  CO2 18*   < > 21*  --  18* 19*  BUN 8   < > 19  --  12 12  CREATININE 0.70   < > 1.04*  --  0.80 0.73  CALCIUM 7.9*   < > 8.1*  --  7.4* 7.1*  PROT 6.0*  --  5.8*  --   --  4.6*  BILITOT 1.4*   < > 1.7* 1.5*  --  1.3*  ALKPHOS  73  --  69  --   --  52  ALT 35  --  37  --   --  28  AST 28  --  33  --   --  24  GLUCOSE 201*   < > 195*  --  169* 150*   < > = values in this interval not displayed.   hepC quant hepC genotype Blood culture TSH covid screening Bilirubin fractionated Urine osmolality  Imaging/Diagnostic Tests: CT ABDOMEN PELVIS W CONTRAST  Result Date: 09/04/2019 CLINICAL DATA:  Abdominal pain and vomiting EXAM: CT ABDOMEN AND PELVIS WITH CONTRAST TECHNIQUE: Multidetector CT imaging of the abdomen and pelvis was performed using the standard protocol following bolus administration of intravenous contrast. CONTRAST:  OMNIPAQUE IOHEXOL 300 MG/ML  SOLN COMPARISON:  Multiple exams, including 08/26/2019 and abdominal ultrasound of 09/04/2019 FINDINGS: Lower chest: Dependent atelectasis in both lungs. Atherosclerotic calcification of portions of the thoracic aorta. Mild cardiomegaly. Hepatobiliary: Hepatic morphology favors cirrhosis. No appreciable enhancing hepatic mass. Contracted gallbladder. No significant biliary dilatation. Pancreas: Unremarkable Spleen: Unremarkable Adrenals/Urinary Tract: 1.0 cm hypodense lesion of the left kidney upper pole is stable and likely a cyst although technically too small to characterize. The adrenal glands appear normal. Urinary bladder unremarkable. Stomach/Bowel: No dilated bowel. Normal appendix. Moderate prominence of fluid and food material in the stomach although this may be coincidental. Vascular/Lymphatic: Aortoiliac atherosclerotic vascular disease. No pathologic adenopathy observed. Reproductive: Unremarkable Other: No supplemental non-categorized findings. Musculoskeletal: Mild sclerosis along the iliac side of both sacroiliac joints, unchanged. Mild grade 1 degenerative retrolisthesis at L5-S1, with facet arthropathy potentially causing mild left foraminal stenosis at this level. Disc bulge at L4-5. Minimal anterior wedging at T5 and T6, likely chronic. IMPRESSION:  1. A specific cause for the patient's abdominal pain and vomiting is not identified. 2. Hepatic morphology favors cirrhosis. 3. Other imaging findings of potential clinical significance: Mild cardiomegaly. Mild sclerosis along the iliac side of both sacroiliac joints, possibly from mild chronic sacroiliitis. Disc bulge at L4-5. Mild left foraminal stenosis at L5-S1 due to facet arthropathy. Minimal anterior wedging at T5 and T6, likely chronic. Aortic Atherosclerosis (ICD10-I70.0). Electronically Signed   By: Gaylyn Rong M.D.   On: 09/04/2019 11:29   US Abdomen Limited RUQ  Result Date: 09/04/2019 CLINICAL DATA:  Hepatitis-C EXAM: ULTRASOUND ABDOMEN LIMITED RIGHT UPPER QUADRANT COMPARISON:  Abdominal CT from 9 days ago FINDINGS: Gallbladder: No gallstones or wall thickening visualized. No sonographic Murphy sign noted by sonographer. Common bile duct: Diameter: 5 mm Liver: Echogenic liver with lobulated surface. There is fissure widening and caudate lobe hypertrophy on prior CT. No evident mass lesion, but limited by the degree of acoustic penetration. Portal vein is patent on color Doppler imaging with normal direction of blood flow towards the liver. IMPRESSION: 1. Cirrhotic liver without visible mass or ascites. 2. Negative gallbladder. Electronically Signed   By: Marnee Spring M.D.   On:  09/04/2019 04:37     Towanda Octave, MD 09/05/2019, 1:17 PM PGY-1, [facility] Family Medicine FPTS Intern pager: [phone], text pages welcome

## 2019-09-05 NOTE — Progress Notes (Signed)
Subjective: Feels better, but still nauseated, still not able to eat full meals.  Objective: Vital signs in last 24 hours: Temp:  [98.1 F (36.7 C)-98.5 F (36.9 C)] 98.5 F (36.9 C) (03/20 0818) Pulse Rate:  [92-115] 105 (03/20 1335) Resp:  [18-20] 20 (03/20 0818) BP: (114-129)/(62-65) 129/64 (03/20 0818) SpO2:  [95 %-98 %] 98 % (03/20 1335) Weight change:  Last BM Date: 09/04/19  PE: GEN:  NAD ABD:  Soft, mild epigastric tenderness  Lab Results: CBC    Component Value Date/Time   WBC 5.5 09/05/2019 0328   RBC 3.00 (L) 09/05/2019 0328   HGB 10.0 (L) 09/05/2019 0328   HCT 29.3 (L) 09/05/2019 0328   PLT 124 (L) 09/05/2019 0328   MCV 97.7 09/05/2019 0328   MCH 33.3 09/05/2019 0328   MCHC 34.1 09/05/2019 0328   RDW 13.0 09/05/2019 0328   LYMPHSABS 2.1 08/26/2019 0836   MONOABS 0.5 08/26/2019 0836   EOSABS 0.3 08/26/2019 0836   BASOSABS 0.0 08/26/2019 0836   CMP     Component Value Date/Time   NA 130 (L) 09/05/2019 0328   K 4.2 09/05/2019 0328   CL 102 09/05/2019 0328   CO2 19 (L) 09/05/2019 0328   GLUCOSE 150 (H) 09/05/2019 0328   BUN 12 09/05/2019 0328   CREATININE 0.73 09/05/2019 0328   CALCIUM 7.1 (L) 09/05/2019 0328   PROT 4.6 (L) 09/05/2019 0328   ALBUMIN 1.8 (L) 09/05/2019 0328   AST 24 09/05/2019 0328   ALT 28 09/05/2019 0328   ALKPHOS 52 09/05/2019 0328   BILITOT 1.3 (H) 09/05/2019 0328   GFRNONAA >60 09/05/2019 0328   GFRAA >60 09/05/2019 0328   Assessment:  1.  Nausea and vomiting. 2.  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 3.  Profound hyponatremia.  Improving, 130 today.  Cause or effect of Nausea/vomiting? 4.  Epigastric discomfort; U/S and CT negative. 5.  Possible mild cirrhosis seen on imaging. 6.  Constipation.  Plan:  1.  Continue PPI BID and sucralfate qac/qhs. 2.  Soft diet, advance as tolerated. 3.  Discussed with son and aunt; they  would like endoscopy, as she's had relapse of symptoms necessitating readmission.   4.  Will plan on endoscopy Monday 09/07/19 for further evaluation. 5.  [facility] will revisit Monday at time of her endoscopy.   Freddy Jaksch 09/05/2019, 2:03 PM   Cell [phone] If no answer or after 5 PM call [phone]

## 2019-09-05 NOTE — Progress Notes (Signed)
Physical Therapy Treatment Patient Details Name: Tracey Riddle MRN: [id number] DOB: 18-Aug-1950 Today's Date: 09/05/2019    History of Present Illness Pt is a 69 y/o female admitted secondary to worsening abdominal pain. Found to have hyponatremia. Pt with recent admission for sepsis from viral gastroenteritis. PMH includes CKD, DM, and TIA.     PT Comments    The pt was in bed with family present upon arrival of PT/OT, and agreeable to session with focus on progressing mobility as the last session was limited to bed mobility. The pt continues to present with limitations in functional mobility, strength, and activity tolerance compared to their prior level of function and independence due to above dx and resulting immobility. However, the pt was able to demo sig improvements in amount of assist needed to mobilize in bed and with sit-stand transfers, requiring only minA of 2 rather than maxA. The pt was also able to take a few lateral steps but declined further mobility due to fatigue. The pt will continue to benefit from skilled PT to maximize functional strength and activity tolerance prior to d/c home with family.     Follow Up Recommendations  Home health PT;Supervision/Assistance - 24 hour     Equipment Recommendations  Wheelchair (measurements PT);Wheelchair cushion (measurements PT)(pt currently would need WC for mobilizing at home, may progress to not needing WC)    Recommendations for Other Services       Precautions / Restrictions Precautions Precautions: Fall Restrictions Weight Bearing Restrictions: No    Mobility  Bed Mobility Overal bed mobility: Needs Assistance Bed Mobility: Supine to Sit     Supine to sit: Min assist     General bed mobility comments: minA to facilitate movement of BLE to EOB, pt then able to reach with minA and VCs to use bed rails and elevated HOB to raise trunk to sitting EOB.  Transfers Overall transfer level: Needs assistance Equipment  used: Rolling walker (2 wheeled) Transfers: Sit to/from Stand Sit to Stand: Min assist;+2 physical assistance         General transfer comment: pt able to stand, reports minor dizziness, but minA of 2 to stand with VCs for hand placement.  Ambulation/Gait Ambulation/Gait assistance: Min guard;+2 safety/equipment Gait Distance (Feet): 3 Feet Assistive device: Rolling walker (2 wheeled) Gait Pattern/deviations: Step-to pattern Gait velocity: reduced Gait velocity interpretation: <1.31 ft/sec, indicative of household ambulator General Gait Details: pt able to take small side steps to [facility] but declined further ambulation at this time due to fatigue   Stairs             Wheelchair Mobility    Modified Rankin (Stroke Patients Only)       Balance Overall balance assessment: Needs assistance Sitting-balance support: Single extremity supported;Feet supported Sitting balance-Leahy Scale: Good Sitting balance - Comments: supervision   Standing balance support: Bilateral upper extremity supported Standing balance-Leahy Scale: Poor Standing balance comment: minG with BUE support of RW                            Cognition Arousal/Alertness: Awake/alert Behavior During Therapy: Flat affect Overall Cognitive Status: Within Functional Limits for tasks assessed                                 General Comments: Pt with flat affect, but cooperative and motivated to participate. Pt very kind, niece was present and  assisted with interpretation as the ipad interpreter service does not have her preferred language      Exercises      General Comments General comments (skin integrity, edema, etc.): pt and neice prefer d/c home with continued services, understandable due to language barrier. discussed frequency of HHPT services available      Pertinent Vitals/Pain Pain Assessment: Faces Faces Pain Scale: Hurts a little bit Pain Location: abdomen Pain  Descriptors / Indicators: Grimacing Pain Intervention(s): Limited activity within patient's tolerance;Monitored during session    Home Living                      Prior Function            PT Goals (current goals can now be found in the care plan section) Acute Rehab PT Goals Patient Stated Goal: to get stronger and feel better PT Goal Formulation: With patient/family Time For Goal Achievement: 09/18/19 Potential to Achieve Goals: Fair Progress towards PT goals: Progressing toward goals    Frequency    Min 3X/week      PT Plan Discharge plan needs to be updated    Co-evaluation PT/OT/SLP Co-Evaluation/Treatment: Yes Reason for Co-Treatment: Complexity of the patient's impairments (multi-system involvement);For patient/therapist safety;To address functional/ADL transfers PT goals addressed during session: Balance;Mobility/safety with mobility;Proper use of DME;Strengthening/ROM        AM-PAC PT "6 Clicks" Mobility   Outcome Measure  Help needed turning from your back to your side while in a flat bed without using bedrails?: A Little Help needed moving from lying on your back to sitting on the side of a flat bed without using bedrails?: A Little Help needed moving to and from a bed to a chair (including a wheelchair)?: A Lot Help needed standing up from a chair using your arms (e.g., wheelchair or bedside chair)?: A Little Help needed to walk in hospital room?: A Lot Help needed climbing 3-5 steps with a railing? : A Lot 6 Click Score: 15    End of Session Equipment Utilized During Treatment: Gait belt Activity Tolerance: Patient tolerated treatment well;Patient limited by fatigue Patient left: in bed;with call bell/phone within reach;with family/visitor present Nurse Communication: Mobility status(goal of mobilizing to chair for meals and BSc rather than bed pan) PT Visit Diagnosis: Muscle weakness (generalized) (M62.81);Difficulty in walking, not elsewhere  classified (R26.2)     Time: 1610-9604 PT Time Calculation (min) (ACUTE ONLY): 26 min  Charges:  $Gait Training: 8-22 mins                     Rolm Baptise, PT, DPT   Acute Rehabilitation Department Pager #: [phone]   Gaetana Michaelis 09/05/2019, 1:43 PM

## 2019-09-05 NOTE — H&P (View-Only) (Signed)
Subjective: Feels better, but still nauseated, still not able to eat full meals.  Objective: Vital signs in last 24 hours: Temp:  [98.1 F (36.7 C)-98.5 F (36.9 C)] 98.5 F (36.9 C) (03/20 0818) Pulse Rate:  [92-115] 105 (03/20 1335) Resp:  [18-20] 20 (03/20 0818) BP: (114-129)/(62-65) 129/64 (03/20 0818) SpO2:  [95 %-98 %] 98 % (03/20 1335) Weight change:  Last BM Date: 09/04/19  PE: GEN:  NAD ABD:  Soft, mild epigastric tenderness  Lab Results: CBC    Component Value Date/Time   WBC 5.5 09/05/2019 0328   RBC 3.00 (L) 09/05/2019 0328   HGB 10.0 (L) 09/05/2019 0328   HCT 29.3 (L) 09/05/2019 0328   PLT 124 (L) 09/05/2019 0328   MCV 97.7 09/05/2019 0328   MCH 33.3 09/05/2019 0328   MCHC 34.1 09/05/2019 0328   RDW 13.0 09/05/2019 0328   LYMPHSABS 2.1 08/26/2019 0836   MONOABS 0.5 08/26/2019 0836   EOSABS 0.3 08/26/2019 0836   BASOSABS 0.0 08/26/2019 0836   CMP     Component Value Date/Time   NA 130 (L) 09/05/2019 0328   K 4.2 09/05/2019 0328   CL 102 09/05/2019 0328   CO2 19 (L) 09/05/2019 0328   GLUCOSE 150 (H) 09/05/2019 0328   BUN 12 09/05/2019 0328   CREATININE 0.73 09/05/2019 0328   CALCIUM 7.1 (L) 09/05/2019 0328   PROT 4.6 (L) 09/05/2019 0328   ALBUMIN 1.8 (L) 09/05/2019 0328   AST 24 09/05/2019 0328   ALT 28 09/05/2019 0328   ALKPHOS 52 09/05/2019 0328   BILITOT 1.3 (H) 09/05/2019 0328   GFRNONAA >60 09/05/2019 0328   GFRAA >60 09/05/2019 0328   Assessment:  1.  Nausea and vomiting. 2.  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 3.  Profound hyponatremia.  Improving, 130 today.  Cause or effect of Nausea/vomiting? 4.  Epigastric discomfort; U/S and CT negative. 5.  Possible mild cirrhosis seen on imaging. 6.  Constipation.  Plan:  1.  Continue PPI BID and sucralfate qac/qhs. 2.  Soft diet, advance as tolerated. 3.  Discussed with son and aunt; they  would like endoscopy, as she's had relapse of symptoms necessitating readmission.   4.  Will plan on endoscopy Monday 09/07/19 for further evaluation. 5.  [facility] will revisit Monday at time of her endoscopy.   Carlen Rebuck M 09/05/2019, 2:03 PM   Cell [phone] If no answer or after 5 PM call [phone]  

## 2019-09-06 LAB — COMPREHENSIVE METABOLIC PANEL
ALT: 29 U/L (ref 0–44)
AST: 31 U/L (ref 15–41)
Albumin: 1.8 g/dL — ABNORMAL LOW (ref 3.5–5.0)
Alkaline Phosphatase: 57 U/L (ref 38–126)
Anion gap: 7 (ref 5–15)
BUN: 8 mg/dL (ref 8–23)
CO2: 18 mmol/L — ABNORMAL LOW (ref 22–32)
Calcium: 7.1 mg/dL — ABNORMAL LOW (ref 8.9–10.3)
Chloride: 100 mmol/L (ref 98–111)
Creatinine, Ser: 0.66 mg/dL (ref 0.44–1.00)
GFR calc Af Amer: >60 mL/min (ref >60)
GFR calc non Af Amer: >60 mL/min (ref >60)
Glucose, Bld: 161 mg/dL — ABNORMAL HIGH (ref 70–99)
Potassium: 3.7 mmol/L (ref 3.5–5.1)
Sodium: 125 mmol/L — ABNORMAL LOW (ref 135–145)
Total Bilirubin: 1 mg/dL (ref 0.3–1.2)
Total Protein: 4.5 g/dL — ABNORMAL LOW (ref 6.5–8.1)

## 2019-09-06 LAB — CBC
HCT: 29.4 % — ABNORMAL LOW (ref 36.0–46.0)
Hemoglobin: 10 g/dL — ABNORMAL LOW (ref 12.0–15.0)
MCH: 32.7 pg (ref 26.0–34.0)
MCHC: 34 g/dL (ref 30.0–36.0)
MCV: 96.1 fL (ref 80.0–100.0)
Platelets: 124 10*3/uL — ABNORMAL LOW (ref 150–400)
RBC: 3.06 MIL/uL — ABNORMAL LOW (ref 3.87–5.11)
RDW: 12.8 % (ref 11.5–15.5)
WBC: 5.3 10*3/uL (ref 4.0–10.5)
nRBC: 0.6 % — ABNORMAL HIGH (ref 0.0–0.2)

## 2019-09-06 LAB — BASIC METABOLIC PANEL
Anion gap: 8 (ref 5–15)
BUN: 9 mg/dL (ref 8–23)
CO2: 17 mmol/L — ABNORMAL LOW (ref 22–32)
Calcium: 7.1 mg/dL — ABNORMAL LOW (ref 8.9–10.3)
Chloride: 100 mmol/L (ref 98–111)
Creatinine, Ser: 0.56 mg/dL (ref 0.44–1.00)
GFR calc Af Amer: >60 mL/min (ref >60)
GFR calc non Af Amer: >60 mL/min (ref >60)
Glucose, Bld: 283 mg/dL — ABNORMAL HIGH (ref 70–99)
Potassium: 4.2 mmol/L (ref 3.5–5.1)
Sodium: 125 mmol/L — ABNORMAL LOW (ref 135–145)

## 2019-09-06 LAB — GLUCOSE, CAPILLARY
Glucose-Capillary: 185 mg/dL — ABNORMAL HIGH (ref 70–99)
Glucose-Capillary: 149 mg/dL — ABNORMAL HIGH (ref 70–99)
Glucose-Capillary: 228 mg/dL — ABNORMAL HIGH (ref 70–99)
Glucose-Capillary: 236 mg/dL — ABNORMAL HIGH (ref 70–99)
Glucose-Capillary: 178 mg/dL — ABNORMAL HIGH (ref 70–99)

## 2019-09-06 NOTE — Discharge Summary (Addendum)
I have reviewed the below documentation. I have seen the patient on the day of discharge and agree with the below exam.  In addition to below, I recommend the following at follow up:  Repeat CBC and BMP next week Recommend blood glucose each morning and before meals, previously on long acting glargine Recommend consideration of Tracey Riddle referral in future for colonoscopy and cirrhosis (likely due to Hep C)   Tracey Singh, MD  Tracey Riddle Discharge Summary  Patient name: Tracey Riddle Medical record number: [id number] Date of birth: Nov 08, 1950 Age: 69 y.o. Gender: female Date of Admission: 09/03/2019  Date of Discharge: 09/15/19 Admitting Physician: Tracey Covert, MD  Primary Care Provider: System, Pcp Not In Consultants: Tracey Riddle, Tracey Riddle  Indication for Hospitalization: Dehydration secondary to diarrhea and vomiting secondary to Strongyloides infection   Discharge Diagnoses/Problem List:  Intestinal strongyloidiasis Esophageal candidiasis Severe duodenitis Moderate gastritis Hepatitis C DMT2 Myositis H/o TIA RA  Disposition: to SNF  Discharge Condition: stable and improved  Discharge Exam: Temp:  [98 F (36.7 C)-98.9 F (37.2 C)] 98.9 F (37.2 C) (03/30 0521) Pulse Rate:  [88-90] 90 (03/30 0521) Resp:  [15] 15 (03/30 0521) BP: (128-133)/(72-80) 128/72 (03/30 0521) SpO2:  [95 %-96 %] 95 % (03/30 0521)  Physical Exam:  General: Resting comfortably, obese African woman in no acute distress Cardio: Regular rate and rhythm, no murmurs appreciated Lungs: Normal work of breathing, clear to auscultation bilaterally, no wheezes noted Abdomen: Soft, NT, ND. Bowel sounds present Skin: no erythema in inguinal creases, some MASD in gluteal cleft, hemorrhoids present on anus  Extremities: no edema, compression stockings present  Brief Riddle Course:  Tracey Riddle is a 69 y.o. female presenting with abdominal  pain and nausea. PMH is significant for DMT2, H/O TIA, reported h/o RA and myositis.  Her Riddle course is outlined below.  Abdominal pain Patient presented with nausea, vomiting, abdominal pain.  Admission details can be found in HPI.  She was started on IV Protonix, but continued to have pain and vomiting.  CT abdomen and pelvis was ordered and showed no cause for the patient's abdominal pain or vomiting.  For this reason, Tracey Riddle was consulted.  They recommended adding Dulcolax suppositories, continuing PPI, starting sucralfate.  EGD was performed on 3/22 that showed moderate gastritis, severe duodenitis, and esophageal candidiasis. Biopsy taken were cultured and grew strongyloides stercoralis. Patient was placed on continuous IV PPI for 2 days, fluconazole 417m IV started x1 day. Infectious disease consulted and patient treated with total 1 week of fluconazole 200 mg PO, and total 2 weeks of ivermectin PO, to end 4/6. Tracey Riddle also recommended for patient to remain on fluconazole 103m Patient [facility] Hyponatremia Patient presented with hypotonic hyponatremia due to persistent nausea/vomiting/Tracey Riddle losses. On admission Na was as low as 123. Improved throughout her admission with IV fluids and improved tolerance of food and fluids by mouth. Corrected Na was 134 at discharge.   Hepatitis C Patient found to have positive hep C antibody on last admission, reflex quant was not able to be performed due to stable size.  Therefore this was repeated during this hospitalization.  Quant hep C was 4.5 million, genotype 4. Patient found to not have immunity to Hep B. Hep B vaccine administered prior to discharge. Patient to follow up with [facility] for treatment.  Bacteremia Patient noted to have Pseudomonas and  E. coli bacteremia during her previous hospitalization.  She was due to continue on Levaquin p.o. until 3/21, but given she was unable to tolerate p.o.,  she was treated with cefepime while inpatient.  She completed a full treatment on 3/21.  Repeat blood cultures obtained on admission showed no growth. This presentation explainable by strongyloidiasis, which is closely associated with polymicrobial GNR bacteremia when the larvae exit the Tracey Riddle tract for the blood stream.   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 She was still on prednisone 73m. Discussed case with Dr. DShearon Riddle [facility] ok to taper. Patient should follow up with Dr. DShearon Riddle Prednisone taper as below.  Issues for Follow Up:  1. Strongyloides: ivermectin 160064m daily to end 09/22/19. 2. Esophageal candidiasis: fluconazole 100 mg once for ppx while on prednisone taper (next dose due 09/21/19). 3. Hepatitis C: Patient to follow up in [facility] clinic for treatment. 4. DMT2: metformin 100013md, check her CBGs before meals. If >300, give novolog 1-2u. 5. Prednisone taper and Myositis: Details below. Patient is [facility], [facility] by 09/22/[facility]. 6. Normocytic anemia: refer patient for outpatient colonoscopy this year  Significant Procedures: EGD  Significant Labs and Imaging:  Recent Labs  Lab 09/12/19 0600 09/13/19 0252 09/14/19 0315  WBC 5.5 6.2 6.3  HGB 9.1* 10.0* 10.2*  HCT 26.6* 30.1* 31.2*  PLT 105* 120* 106*   Recent Labs  Lab 09/10/19 0247 09/10/19 0247 09/11/19 0206 09/11/19 0206 09/12/19 0600 09/12/19 0600 09/13/19 0252 09/14/19 0315  NA 129*  --  128*  --  131*  --  132* 132*  K 3.5   < > 3.7   < > 3.4*   < > 4.0 4.2  CL 105  --  105  --  104  --  104 103  CO2 16*  --  18*  --  20*  --  20* 22  GLUCOSE 183*  --  183*  --  193*  --  234* 200*  BUN 6*  --  7*  --  8  --  11 14  CREATININE 0.49  --  0.49  --  0.51  --  0.62 0.60  CALCIUM 6.8*  --  6.8*  --  7.4*   --  7.7* 7.5*   < > = values in this interval not displayed.   8 d ago   SURGICAL PATHOLOGY SURGICAL PATHOLOGY  CASE: [id number]  PATIENT: MARParmeleurgical Pathology Report      Clinical History: epigastric pain, nausea, vomiting, check for  inflammation (cm)      FINAL MICROSCOPIC DIAGNOSIS:   A. STOMACH, DISTAL, BIOPSY:  - Gastric mucosa showing Strongyloides Stercoralis-associated gastritis.  See comment  - Warthin-Starry stain is negative for Helicobacter pylori     COMMENT:   Dr. SchMichail Sermons paged on 09/08/2019.     GROSS DESCRIPTION:   Received in formalin are tan, soft tissue fragments that are submitted  in toto. Number: 2 size: 0.3 cm each blocks: 1 (KLCraig Staggers23/2021)     Final Diagnosis performed by NilJaquita FoldsD.  Electronically  signed 09/08/2019  Technical component performed at [facility] [id number] component performed at [facility] [place] [id number] Technical component (if applicable) was  performed  at [facility]. [address], [place],  [place], [place] [address].  IMMUNOHISTOCHEMISTRY DISCLAIMER (if applicable):  Some of these immunohistochemical stains may have been developed and the  performance characteristics determine by [facility]. Some  may not have been cleared or approved by the U.S. Food and Drug  Administration. The FDA has determined that such clearance or approval  is not necessary. This test is used for clinical purposes. It should not  be regarded as investigational or for research. This laboratory is  certified under the [place]  (CLIA-88) as qualified to perform high complexity clinical laboratory  testing. The controls stained appropriately.    Results/Tests Pending at Time of Discharge:  none  Discharge Medications:  Allergies as of 09/15/2019   No Known Allergies     Medication List    STOP taking these medications   furosemide 20 MG tablet Commonly known as: LASIX   insulin lispro 100 UNIT/ML KwikPen Commonly known as: HUMALOG   levofloxacin 750 MG tablet Commonly known as: LEVAQUIN   metoprolol tartrate 25 MG tablet Commonly known as: LOPRESSOR   Toviaz 8 MG Tb24 tablet Generic drug: fesoterodine     TAKE these medications   acetaminophen 325 MG tablet Commonly known as: TYLENOL Take 2 tablets (650 mg total) by mouth every 6 (six) hours. What changed:   medication strength  how much to take  when to take this  reasons to take this   aspirin 81 MG EC tablet Take 1 tablet (81 mg total) by mouth daily.   atorvastatin 40 MG tablet Commonly known as: LIPITOR Take 1 tablet (40 mg total) by mouth daily at 6 PM.   bisacodyl 5 MG EC tablet Commonly known as: DULCOLAX Take 1 tablet (5 mg total) by mouth daily as needed for moderate constipation.   blood glucose meter kit and supplies Kit Dispense based on patient and insurance preference. Use up to four times daily as directed. (FOR ICD-9 250.00, 250.01).   fluconazole 100 MG tablet Commonly known as: Diflucan Take 1 tablet (100 mg total) by mouth once a week. Take only on 09/21/19 Start taking on: September 21, 2019   hydrocortisone-pramoxine rectal foam Commonly known as: PROCTOFOAM-HC Place 1 applicator rectally 2 (two) times daily as needed for hemorrhoids.   ivermectin 3 MG Tabs tablet Commonly known as: STROMECTOL Take 6 tablets (18,000 mcg total) by mouth daily for 12 doses.   liver oil-zinc oxide 40 % ointment Commonly known as: DESITIN Apply 1 application topically daily. Start taking on: September 16, 2019   metformin 1000 MG (OSM) 24 hr tablet Commonly known as: FORTAMET Take 1 tablet (1,000 mg total) by mouth daily with breakfast. Start taking on: September 16, 2019   pantoprazole 40  MG tablet Commonly known as: PROTONIX Take 1 tablet (40 mg total) by mouth 2 (two) times daily. What changed: when to take this   polyethylene glycol 17 g packet Commonly known as: MIRALAX / GLYCOLAX Take 17 g by mouth 2 (two) times daily.   predniSONE 5 MG tablet Commonly known as: DELTASONE Take 1 tablet (5 mg total) by mouth daily with breakfast. Take 2 tablets by mouth daily from 3/31- 4/2, followed by 1 tablet by mouth daily from 4/3-4/6 What changed:   medication strength  how much to take  when to take this  additional instructions   senna 8.6 MG Tabs tablet Commonly known as: SENOKOT Take 2 tablets (17.2  mg total) by mouth daily. Start taking on: September 16, 2019            Durable Medical Equipment  (From admission, onward)         Start     Ordered   09/09/19 1121  For home use only DME Riddle bed  Once    Comments: Call niece [name] [phone] for delivery  Question Answer Comment  Length of Need Lifetime   Patient has (list medical condition): Nausea, vomiting  Severe Duodenitis   The above medical condition requires: Patient requires the ability to reposition frequently   Head must be elevated greater than: 45 degrees   Bed type Semi-electric   Support Surface: Gel Overlay      09/09/19 1122   09/07/19 1553  For home use only DME lightweight manual wheelchair with seat cushion  Once    Comments: 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  A cane will not resolve  issue with performing activities of daily living. A wheelchair will allow patient to safely perform daily activities. Patient is not able to propel themselves in the home using a standard weight wheelchair due to Hypotonic Hyponatremia . Patient can self propel in the lightweight wheelchair. Length of need life time . Accessories: elevating leg rests (ELRs), wheel locks, extensions and  anti-tippers.  Seat and back cushions.   09/07/19 1553   09/06/19 0824  For home use only DME 3 n 1  Once     09/06/19 0932          Discharge Instructions: Please refer to Patient Instructions section of EMR for full details.  Patient was counseled important signs and symptoms that should prompt return to medical care, changes in medications, dietary instructions, activity restrictions, and follow up appointments.   Follow-Up Appointments:  Contact information for follow-up providers    Gladys Damme, MD. Go on 09/23/2019.   Specialty: Family Medicine Why: Appt at 1:30 Contact information: [address] [address] [phone]        [facility]. Go on 10/01/2019.   Specialty: Infectious Diseases Why: go to appointment at 4 PM Contact information: [phone] [place] [phone]           Contact information for after-discharge care    Destination    [facility] .   Service: Skilled Nursing Contact information: [address] [place] [phone]                  Gladys Damme, MD 09/15/2019, 1:27 PM PGY-1, [place]

## 2019-09-06 NOTE — Plan of Care (Signed)
  Problem: Clinical Measurements: Goal: Diagnostic test results will improve Outcome: Progressing   

## 2019-09-06 NOTE — Progress Notes (Signed)
Family Medicine Teaching Service Daily Progress Note Intern Pager: [phone]  Patient name: Tracey Riddle Medical record number: [id number] Date of birth: 1951/02/23 Age: 69 y.o. Gender: female  Primary Care Provider: System, Pcp Not In Consultants: GI Code Status: full  Pt Overview and Major Events to Date:  3/19- admitted  Assessment and Plan: Tracey Riddle is a 69 y.o. female presenting with intractable abdominal pain and nausea. PMH is significant for DMT2, H/O TIA, reported h/o RA and myositis  Nausea, vomiting GI planning for EGD on 09/07/19 for further evaluation.  This AM endorses continued pain and vomiting.  Currently on soft diet.  1 stool recorded yesterday, but reports constipation today. -GI following appreciate recommendations. Will plan for EGD on 09/07/19 for further evaluation.  - NPO at midnight -Continue PPI BID IV and sucralfate.   Hepatitis C RUQ DG:LOVFIEPPI liver with lobulated surface, no mass or ascites. Negative gallbladder.  AST, ALT wnl. Total bili 1.7>1.5>1.3, direct= 0.3, indirect 1.2. Quant Hep C: 4.5 million. -f/u hepC genotype, Hep C Ab Reflex -GI following, will likely manage as outpatient -Consider hep B vaccine prior to dc?  Bacteremia Patient will complete treatment today for Pseudomonas and E. coli bacteremia.  Blood cultures on this admission no growth at 2 days. - levaquin (3/15-3/18) - cefepime (3/19-3/21) - F/u urine culture, blood culture  Hypotonic Hyponatremia Na 123>130>125. Could consider repeating urine studies and if Urine Na >40 and urine osmol >100, would consider AM cortisol and ACTH stim test.  TSH checked on 3/19 was WNL. - NS - recheck BMP at 1500 - frequent neuro checks - strict I/Os - BMP this   DMT2 Highest CBG in last 24 hrs, 248, otherwise 140s-180s.  A1c 8. Home medications include lispro 5-10U before meals. -CBG q4h while not tolerating PO -carb modified diet -sSSI - goal CBGs at the moment <180,  will work for tighter control as she improves  H/o RA  Myositis -Restart prednisone 40mg  daily if patient able to tolerate PO with anti-emetic - in the meantime, can do IV methylpred and begin slow taper  H/O TIA Lipid panel wnl except HDL 32. Home medication lipitor 40 mg, ASA 81mg . - Restart home medications if able to tolerate with anti-emetic  Constipation - miralax daily  Normocytic Anemia Hgb 10, stable from yesterday, was 12.9 on admission, has been receiving IV fluids, could have slight dilutional component.  MCV 96.1.  Was around 11 on previous admission. - monitor CBC - consider outpatient colonoscopy  FEN/GI: Soft Diet, NPO at 12am Prophylaxis: lovenox  Disposition: pending continued treatment of N/V, planning for EGD 3/22, eventually [facility] with [facility] - orders in  Subjective:  Complaining of continued nausea and vomiting this AM. States that she is constipated as well.  Objective: Temp:  [98 F (36.7 C)-98.9 F (37.2 C)] 98 F (36.7 C) (03/21 0444) Pulse Rate:  [103-113] 108 (03/21 0444) Resp:  [16-20] 18 (03/21 0444) BP: (122-133)/(70-76) 125/72 (03/21 0444) SpO2:  [92 %-99 %] 92 % (03/21 0444)  Physical Exam:  General: 69 y.o. female in NAD Cardio: RRR no m/r/g Lungs: CTAB, no wheezing, no rhonchi, no crackles, no IWOB on RA Abdomen: Soft, mildly TTP epigastric, mildly distended, positive bowel sounds Skin: warm and dry Extremities: No edema   Laboratory: Recent Labs  Lab 09/04/19 0026 09/05/19 0328 09/06/19 0751  WBC 7.3 5.5 5.3  HGB 12.9 10.0* 10.0*  HCT 37.3 29.3* 29.4*  PLT 205 124* 124*   Recent Labs  Lab 09/04/19  2706 09/04/19 0026 09/04/19 0759 09/04/19 1700 09/05/19 0328 09/06/19 0751  NA 123*   < >  --  129* 130* 125*  K 4.5   < >  --  4.4 4.2 3.7  CL 88*   < >  --  98 102 100  CO2 21*   < >  --  18* 19* 18*  BUN 19   < >  --  12 12 8   CREATININE 1.04*   < >  --  0.80 0.73 0.66  CALCIUM 8.1*   < >  --  7.4* 7.1* 7.1*  PROT  5.8*  --   --   --  4.6* 4.5*  BILITOT 1.7*   < > 1.5*  --  1.3* 1.0  ALKPHOS 69  --   --   --  52 57  ALT 37  --   --   --  28 29  AST 33  --   --   --  24 31  GLUCOSE 195*   < >  --  169* 150* 161*   < > = values in this interval not displayed.   hepC quant hepC genotype Blood culture TSH covid screening Bilirubin fractionated Urine osmolality  Imaging/Diagnostic Tests: CT ABDOMEN PELVIS W CONTRAST  Result Date: 09/04/2019 CLINICAL DATA:  Abdominal pain and vomiting EXAM: CT ABDOMEN AND PELVIS WITH CONTRAST TECHNIQUE: Multidetector CT imaging of the abdomen and pelvis was performed using the standard protocol following bolus administration of intravenous contrast. CONTRAST:  162mL OMNIPAQUE IOHEXOL 300 MG/ML  SOLN COMPARISON:  Multiple exams, including 08/26/2019 and abdominal ultrasound of 09/04/2019 FINDINGS: Lower chest: Dependent atelectasis in both lungs. Atherosclerotic calcification of portions of the thoracic aorta. Mild cardiomegaly. Hepatobiliary: Hepatic morphology favors cirrhosis. No appreciable enhancing hepatic mass. Contracted gallbladder. No significant biliary dilatation. Pancreas: Unremarkable Spleen: Unremarkable Adrenals/Urinary Tract: 1.0 cm hypodense lesion of the left kidney upper pole is stable and likely a cyst although technically too small to characterize. The adrenal glands appear normal. Urinary bladder unremarkable. Stomach/Bowel: No dilated bowel. Normal appendix. Moderate prominence of fluid and food material in the stomach although this may be coincidental. Vascular/Lymphatic: Aortoiliac atherosclerotic vascular disease. No pathologic adenopathy observed. Reproductive: Unremarkable Other: No supplemental non-categorized findings. Musculoskeletal: Mild sclerosis along the iliac side of both sacroiliac joints, unchanged. Mild grade 1 degenerative retrolisthesis at L5-S1, with facet arthropathy potentially causing mild left foraminal stenosis at this level. Disc  bulge at L4-5. Minimal anterior wedging at T5 and T6, likely chronic. IMPRESSION: 1. A specific cause for the patient's abdominal pain and vomiting is not identified. 2. Hepatic morphology favors cirrhosis. 3. Other imaging findings of potential clinical significance: Mild cardiomegaly. Mild sclerosis along the iliac side of both sacroiliac joints, possibly from mild chronic sacroiliitis. Disc bulge at L4-5. Mild left foraminal stenosis at L5-S1 due to facet arthropathy. Minimal anterior wedging at T5 and T6, likely chronic. Aortic Atherosclerosis (ICD10-I70.0). Electronically Signed   By: Van Clines M.D.   On: 09/04/2019 11:29     [place], Bernita Raisin, DO 09/06/2019, 10:57 AM PGY-2, [place] Intern pager: [phone], text pages welcome

## 2019-09-07 ENCOUNTER — Inpatient Hospital Stay (HOSPITAL_COMMUNITY): Payer: Medicare Other | Admitting: Certified Registered"

## 2019-09-07 ENCOUNTER — Encounter (HOSPITAL_COMMUNITY)
Admission: EM | Disposition: A | Discharge status: Skilled Nursing Facility | Payer: Self-pay | Source: Home / Self Care | Attending: Family Medicine

## 2019-09-07 ENCOUNTER — Other Ambulatory Visit: Payer: Self-pay | Admitting: Physician Assistant

## 2019-09-07 ENCOUNTER — Encounter (HOSPITAL_COMMUNITY): Payer: Self-pay | Admitting: Family Medicine

## 2019-09-07 HISTORY — PX: ESOPHAGEAL BRUSHING: SHX6842

## 2019-09-07 HISTORY — PX: ESOPHAGOGASTRODUODENOSCOPY (EGD) WITH PROPOFOL: SHX5813

## 2019-09-07 HISTORY — PX: BIOPSY: SHX5522

## 2019-09-07 LAB — BASIC METABOLIC PANEL
Anion gap: 8 (ref 5–15)
BUN: 6 mg/dL — ABNORMAL LOW (ref 8–23)
CO2: 18 mmol/L — ABNORMAL LOW (ref 22–32)
Calcium: 7.1 mg/dL — ABNORMAL LOW (ref 8.9–10.3)
Chloride: 103 mmol/L (ref 98–111)
Creatinine, Ser: 0.51 mg/dL (ref 0.44–1.00)
GFR calc Af Amer: >60 mL/min (ref >60)
GFR calc non Af Amer: >60 mL/min (ref >60)
Glucose, Bld: 144 mg/dL — ABNORMAL HIGH (ref 70–99)
Potassium: 3.3 mmol/L — ABNORMAL LOW (ref 3.5–5.1)
Sodium: 129 mmol/L — ABNORMAL LOW (ref 135–145)

## 2019-09-07 LAB — CBC
HCT: 28.8 % — ABNORMAL LOW (ref 36.0–46.0)
Hemoglobin: 9.8 g/dL — ABNORMAL LOW (ref 12.0–15.0)
MCH: 33.2 pg (ref 26.0–34.0)
MCHC: 34 g/dL (ref 30.0–36.0)
MCV: 97.6 fL (ref 80.0–100.0)
Platelets: 129 10*3/uL — ABNORMAL LOW (ref 150–400)
RBC: 2.95 MIL/uL — ABNORMAL LOW (ref 3.87–5.11)
RDW: 13 % (ref 11.5–15.5)
WBC: 5.5 10*3/uL (ref 4.0–10.5)
nRBC: 0.9 % — ABNORMAL HIGH (ref 0.0–0.2)

## 2019-09-07 LAB — GLUCOSE, CAPILLARY
Glucose-Capillary: 124 mg/dL — ABNORMAL HIGH (ref 70–99)
Glucose-Capillary: 124 mg/dL — ABNORMAL HIGH (ref 70–99)
Glucose-Capillary: 125 mg/dL — ABNORMAL HIGH (ref 70–99)
Glucose-Capillary: 132 mg/dL — ABNORMAL HIGH (ref 70–99)
Glucose-Capillary: 166 mg/dL — ABNORMAL HIGH (ref 70–99)
Glucose-Capillary: 192 mg/dL — ABNORMAL HIGH (ref 70–99)
Glucose-Capillary: 214 mg/dL — ABNORMAL HIGH (ref 70–99)

## 2019-09-07 LAB — HCV RT-PCR, QUANT (NON-GRAPH)
HCV log10: 6.639 log10 IU/mL
Hepatitis C Quantitation: 4360000 IU/mL

## 2019-09-07 LAB — MAGNESIUM: Magnesium: 1.6 mg/dL — ABNORMAL LOW (ref 1.7–2.4)

## 2019-09-07 LAB — HCV AB W REFLEX TO QUANT PCR: HCV Ab: >11 s/co ratio — ABNORMAL HIGH (ref 0.0–0.9)

## 2019-09-07 SURGERY — ESOPHAGOGASTRODUODENOSCOPY (EGD) WITH PROPOFOL
Anesthesia: Monitor Anesthesia Care | Laterality: Left

## 2019-09-07 MED ORDER — PHENYLEPHRINE 40 MCG/ML (10ML) SYRINGE FOR IV PUSH (FOR BLOOD PRESSURE SUPPORT)
PREFILLED_SYRINGE | INTRAVENOUS | Status: DC | PRN
  Administered 2019-09-07 (×2): 120 ug via INTRAVENOUS

## 2019-09-07 MED ORDER — LACTATED RINGERS IV SOLN
INTRAVENOUS | Status: AC | PRN
  Administered 2019-09-07: 1000 mL via INTRAVENOUS

## 2019-09-07 MED ORDER — PANTOPRAZOLE SODIUM 40 MG IV SOLR
40.0000 mg | Freq: Two times a day (BID) | INTRAVENOUS | Status: DC
  Administered 2019-09-10 – 2019-09-12 (×4): 40 mg via INTRAVENOUS
  Filled 2019-09-07 (×4): qty 40

## 2019-09-07 MED ORDER — FLUCONAZOLE IN SODIUM CHLORIDE 400-0.9 MG/200ML-% IV SOLN
400.0000 mg | Freq: Once | INTRAVENOUS | Status: AC
  Administered 2019-09-07: 400 mg via INTRAVENOUS
  Filled 2019-09-07: qty 200

## 2019-09-07 MED ORDER — PROPOFOL 10 MG/ML IV BOLUS
INTRAVENOUS | Status: DC | PRN
  Administered 2019-09-07: 20 mg via INTRAVENOUS

## 2019-09-07 MED ORDER — SODIUM CHLORIDE 0.9 % IV SOLN
8.0000 mg/h | INTRAVENOUS | Status: AC
  Administered 2019-09-07 – 2019-09-10 (×8): 8 mg/h via INTRAVENOUS
  Filled 2019-09-07 (×10): qty 80

## 2019-09-07 MED ORDER — PANTOPRAZOLE SODIUM 40 MG IV SOLR
40.0000 mg | Freq: Once | INTRAVENOUS | Status: AC
  Administered 2019-09-07: 40 mg via INTRAVENOUS
  Filled 2019-09-07: qty 40

## 2019-09-07 MED ORDER — METHYLPREDNISOLONE SODIUM SUCC 40 MG IJ SOLR
24.0000 mg | Freq: Every day | INTRAMUSCULAR | Status: DC
  Administered 2019-09-08 – 2019-09-09 (×2): 24 mg via INTRAVENOUS
  Filled 2019-09-07 (×3): qty 1

## 2019-09-07 MED ORDER — ESMOLOL HCL 100 MG/10ML IV SOLN
INTRAVENOUS | Status: DC | PRN
  Administered 2019-09-07: 20 mg via INTRAVENOUS

## 2019-09-07 MED ORDER — MAGNESIUM SULFATE 2 GM/50ML IV SOLN
2.0000 g | Freq: Once | INTRAVENOUS | Status: AC
  Administered 2019-09-07: 2 g via INTRAVENOUS
  Filled 2019-09-07: qty 50

## 2019-09-07 MED ORDER — SODIUM CHLORIDE 0.9 % IV SOLN: INTRAVENOUS | Status: DC | PRN

## 2019-09-07 MED ORDER — POTASSIUM CHLORIDE 10 MEQ/100ML IV SOLN
10.0000 meq | INTRAVENOUS | Status: AC
  Administered 2019-09-07 (×2): 10 meq via INTRAVENOUS
  Filled 2019-09-07 (×2): qty 100

## 2019-09-07 MED ORDER — PROPOFOL 500 MG/50ML IV EMUL
INTRAVENOUS | Status: DC | PRN
  Administered 2019-09-07: 100 ug/kg/min via INTRAVENOUS

## 2019-09-07 SURGICAL SUPPLY — 15 items

## 2019-09-07 NOTE — Plan of Care (Signed)
  Problem: Elimination: Goal: Will not experience complications related to urinary retention Outcome: Progressing   Problem: Pain Managment: Goal: General experience of comfort will improve Outcome: Progressing   

## 2019-09-07 NOTE — Progress Notes (Signed)
PT Cancellation Note  Patient Details Name: Tracey Riddle MRN: [id number] DOB: [birth year]   Cancelled Treatment:    Reason Eval/Treat Not Completed: Patient at procedure or test/unavailable   Off unit at Endoscopy;  Will follow;   Van Clines, PT  Acute Rehabilitation Services Pager [phone] Office [phone]    Levi Aland 09/07/2019, 10:44 AM

## 2019-09-07 NOTE — Op Note (Signed)
[facility] Patient Name: Tracey Riddle Procedure Date : 09/07/2019 MRN: [id number] Attending MD: [name] , MD Date of Birth: Nov 28, 1950 CSN: [id number] Age: 69 Admit Type: Inpatient Procedure:                Upper GI endoscopy Indications:              Epigastric abdominal pain, Nausea with vomiting Providers:                [name], MD, Bonney Leitz, Janie                            Billups, Asher Muir CRNA Referring MD:             hospital team Medicines:                Propofol per Anesthesia, Monitored Anesthesia Care Complications:            No immediate complications. Estimated Blood Loss:     Estimated blood loss was minimal. Procedure:                Pre-Anesthesia Assessment:                           - Prior to the procedure, a History and Physical                            was performed, and patient medications and                            allergies were reviewed. The patient's tolerance of                            previous anesthesia was also reviewed. The risks                            and benefits of the procedure and the sedation                            options and risks were discussed with the patient.                            All questions were answered, and informed consent                            was obtained. Prior Anticoagulants: The patient has                            taken no previous anticoagulant or antiplatelet                            agents. ASA Grade Assessment: II - A patient with                            mild systemic disease. After reviewing the risks  and benefits, the patient was deemed in                            satisfactory condition to undergo the procedure.                           After obtaining informed consent, the endoscope was                            passed under direct vision. Throughout the                            procedure,  the patient's blood pressure, pulse, and                            oxygen saturations were monitored continuously. The                            GIF-H190 ([id number]) Olympus gastroscope was                            introduced through the mouth, and advanced to the                            second part of duodenum. The upper GI endoscopy was                            somewhat difficult due to presence of bezoar.                            Successful completion of the procedure was aided by                            performing the maneuvers documented (below) in this                            report. Scope In: Scope Out: Findings:      Diffuse, white plaques were found in the mid esophagus and in the distal       esophagus. Cells for cytology were obtained by brushing.      The Z-line was regular and was found 40 cm from the incisors.      Segmental moderate inflammation characterized by congestion (edema) and       erythema was found in the prepyloric region of the stomach. Biopsies       were taken with a cold forceps for histology. Estimated blood loss was       minimal.      Diffuse severe mucosal changes characterized by congestion, erythema and       ulceration were found in the duodenal bulb and in the second portion of       the duodenum. Biopsies were taken with a cold forceps for histology.       Estimated blood loss was minimal.      A large amount of food (residue) was found in the gastric body. Impression:               -  Esophageal plaques were found, suspicious for                            candidiasis. Cells for cytology obtained.                           - Z-line regular, 40 cm from the incisors.                           - Gastritis. Biopsied.                           - Mucosal changes in the duodenum. Biopsied.                           - A large amount of food (residue) in the stomach. Recommendation:           - NPO.                           - Observe  patient's clinical course.                           - Await pathology results.                           - Give Protonix (pantoprazole): initiate therapy                            with 40 mg IV bolus, then 8 mg/hr IV by continuous                            infusion. Procedure Code(s):        --- Professional ---                           [phone], Esophagogastroduodenoscopy, flexible,                            transoral; with biopsy, single or multiple Diagnosis Code(s):        --- Professional ---                           R10.13, Epigastric pain                           R11.2, Nausea with vomiting, unspecified                           K29.70, Gastritis, unspecified, without bleeding                           K22.9, Disease of esophagus, unspecified                           K31.89, Other diseases of stomach and duodenum CPT copyright 2019 American Medical Association. All rights reserved. The codes documented in this report are  preliminary and upon coder review may  be revised to meet current compliance requirements. [name], MD 09/07/2019 10:03:50 AM This report has been signed electronically. Number of Addenda: 0

## 2019-09-07 NOTE — TOC Initial Note (Signed)
Transition of Care [facility]) - Initial/Assessment Note    Patient Details  Name: Unita Detamore MRN: [id number] Date of Birth: 1950-07-14  Transition of [facility]) CM/SW Contact:    Kingsley Plan, RN Phone Number: 09/07/2019, 3:46 PM  Clinical Narrative:                 Spoke to nieces at bedside. [name] provided information , from home with family, needs wheelchair already have a  3 in1 .   Active with [facility] for RN,PT,OT confirmed with Tiffany, requested orders.  Call Zack with [employer] for wheelchair  Expected Discharge Plan: Home w Home Health Services Barriers to Discharge: Continued Medical Work up   Patient Goals and CMS Choice Patient states their goals for this hospitalization and ongoing recovery are:: to return to home CMS Medicare.gov Compare Post Acute Care list provided to:: Patient Choice offered to / list presented to : (nieces Kara Mead and [place])  Expected Discharge Plan and Services Expected Discharge Plan: Home w Home Health Services   Discharge Planning Services: CM Consult Post Acute Care Choice: Home Health, Durable Medical Equipment Living arrangements for the past 2 months: Single Family Home                 DME Arranged: 3-N-1, Community education officer wheelchair with seat cushion DME Agency: [employer] Date DME Agency Contacted: 09/07/19 Time DME Agency Contacted: [phone] Representative spoke with at DME Agency: Zack HH Arranged: RN, PT, OT HH Agency: [facility] (formerly [employer]) Date HH Agency Contacted: 09/07/19 Time HH Agency Contacted: 1545 Representative spoke with at [facility] Agency: Tiffany  Prior Living Arrangements/Services Living arrangements for the past 2 months: Single Family Home Lives with:: Other (Comment)(nieces) Patient language and need for interpreter reviewed:: Yes Do you feel safe going back to the place where you live?: Yes      Need for Family Participation in Patient Care: Yes (Comment) Care giver support  system in place?: Yes (comment) Current home services: Home PT, Home RN, Home OT Criminal Activity/Legal Involvement Pertinent to Current Situation/Hospitalization: No - Comment as needed  Activities of Daily Living      Permission Sought/Granted      Share Information with NAME: Karenann Cai and Kara Mead           Emotional Assessment              Admission diagnosis:  Hyponatremia [E87.1] Hepatitis C [B19.20] Non-intractable vomiting with nausea, unspecified vomiting type [R11.2] Patient Active Problem List   Diagnosis Date Noted  . Non-intractable vomiting   . Diarrhea 08/27/2019  . Hyponatremia 08/27/2019  . Pyuria 08/27/2019  . Abnormal liver CT 08/27/2019  . Diabetes mellitus type II, controlled (HCC) 08/27/2019  . Pseudomonas sepsis (HCC) 08/27/2019  . Bacteremia due to Escherichia coli 08/27/2019  . Hypoproteinemia (HCC) 08/27/2019  . Thrombocytopenia (HCC) 08/27/2019  . Long-term corticosteroid use 08/27/2019   PCP:  System, Pcp Not In Pharmacy:   [id number] - [place], [place] - [address] AT [facility] [address] [place] [address] Phone: [phone] Fax: [phone]     Social Determinants of Health (SDOH) Interventions    Readmission Risk Interventions No flowsheet data found.

## 2019-09-07 NOTE — Progress Notes (Signed)
Inpatient Diabetes Program Recommendations  AACE/ADA: New Consensus Statement on Inpatient Glycemic Control (2015)  Target Ranges:  Prepandial:   less than 140 mg/dL      Peak postprandial:   less than 180 mg/dL (1-2 hours)      Critically ill patients:  140 - 180 mg/dL   Lab Results  Component Value Date   GLUCAP 166 (H) 09/07/2019   HGBA1C 8.0 (H) 08/26/2019    Review of Glycemic Control  Diabetes history: DM 2 Outpatient Diabetes medications: Humalog 5 units tid for glucose 200-300 range. Humalog 10 units tid for glucose 300-400 range Current orders for Inpatient glycemic control:  Novolog 0-9 units Q4 hours  Solumedrol 32 mg Daily A1c 8% on 3/10  Inpatient Diabetes Program Recommendations:    Glucose trends increased after carbohydrate/meal intake. Pt on Solumedrol 32 mg Daily.   Consider Novolog 3 units tid if eating >50% of meals.  Thanks,  Christena Deem RN, MSN, BC-ADM Inpatient Diabetes Coordinator Team Pager [phone] (8a-5p)

## 2019-09-07 NOTE — Interval H&P Note (Signed)
History and Physical Interval Note:  09/07/2019 9:21 AM  Tracey Riddle  has presented today for surgery, with the diagnosis of epigastric pain, nausea, vomiting.  The various methods of treatment have been discussed with the patient and family. After consideration of risks, benefits and other options for treatment, the patient has consented to  Procedure(s): ESOPHAGOGASTRODUODENOSCOPY (EGD) WITH PROPOFOL (Left) as a surgical intervention.  The patient's history has been reviewed, patient examined, no change in status, stable for surgery.  I have reviewed the patient's chart and labs.  Questions were answered to the patient's satisfaction.     [name]

## 2019-09-07 NOTE — Transfer of Care (Signed)
Immediate Anesthesia Transfer of Care Note  Patient: Tracey Riddle  Procedure(s) Performed: ESOPHAGOGASTRODUODENOSCOPY (EGD) WITH PROPOFOL (Left ) BIOPSY ESOPHAGEAL BRUSHING  Patient Location: PACU  Anesthesia Type:MAC  Level of Consciousness: drowsy  Airway & Oxygen Therapy: Patient Spontanous Breathing and Patient connected to nasal cannula oxygen  Post-op Assessment: Report given to RN and Post -op Vital signs reviewed and stable  Post vital signs: Reviewed and stable  Last Vitals:  Vitals Value Taken Time  BP 130/104 09/07/19 0956  Temp    Pulse 120 09/07/19 1003  Resp 18 09/07/19 1003  SpO2 96 % 09/07/19 1003  Vitals shown include unvalidated device data.  Last Pain:  Vitals:   09/07/19 0831  TempSrc: Oral  PainSc: 7       Patients Stated Pain Goal: 4 (19/14/78 2956)  Complications: No apparent anesthesia complications

## 2019-09-07 NOTE — Care Management (Signed)
    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  A cane will not resolve  issue with performing activities of daily living. A wheelchair will allow patient to safely perform daily activities. Patient is not able to propel themselves in the home using a standard weight wheelchair due to Hypotonic Hyponatremia . Patient can self propel in the lightweight wheelchair. Length of need life time . Accessories: elevating leg rests (ELRs), wheel locks, extensions and anti-tippers.  Seat and back cushions.   09/07/19 1553   09/06/19 0824  For home use only DME 3 n 1  Once     09/06/19 2411

## 2019-09-07 NOTE — Progress Notes (Signed)
Family Medicine Teaching Service Daily Progress Note Intern Pager: [phone]  Patient name: Tracey Riddle Medical record number: [id number] Date of birth: 09-06-1950 Age: 69 y.o. Gender: female  Primary Care Provider: System, Pcp Not In Consultants: GI Code Status: full  Pt Overview and Major Events to Date:  3/19- admitted 3/22- EGD  Assessment and Plan: Tracey Riddle is a 69 y.o. female presenting with intractable abdominal pain and nausea. 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 Also found gastritis and mucosal changes in the duodenum, pathology results pending. GI recommends to have patient remain NPO, observe clinical course, await pathology results, and give protonix with 40mg  IV bolus and then 8mg /hr IV continuous infusion after that. Will begin treatment with fluconazole today. Patient ate 25% of meal yesterday. -GI following appreciate recommendations.  -Protonix: 40mg  IV bolus followed by 8mg /hr IV continuous infusion -NPO -F/U biopsy results -Fluconazole 400 mg IV  Hepatitis C RUQ liver with lobulated surface, no mass or ascites. Negative gallbladder.  AST, ALT wnl. Total bili 1.7>1.5>1.3>1.0, direct= 0.3, indirect 1.2. Quant Hep C: 4.5 million. -f/u hepC genotype, Hep C Ab Reflex, in process -GI following, will likely manage as outpatient -Consider hep B vaccine prior to dc?  Bacteremia- resolved Patient completed treatment for Pseudomonas and E. coli bacteremia 3/21.  Blood cultures on this admission no growth at 3 days. Urine culture positive for 60K CFU yeast. Treating possible esophageal candidiasis with fluconazole, see above. - levaquin (3/15-3/18) - cefepime (3/19-3/21)  Hypotonic Hyponatremia Na 123>130>125>129. Could consider repeating urine  studies and if Urine Na >40 and urine osmol >100, would consider AM cortisol and ACTH stim test.  TSH checked on 3/19 was WNL. Patient to remain NPO for now, will continue IVF today. - NS 13mL/hr - frequent neuro checks - strict I/Os - BMP daily  DMT2 CBGs [phone]. Most recently 166. Received Aspart 12U yesterday, methylprednisolone 32mg  daily.  A1c 8. Home medications include lispro 5-10U before meals. -CBG q4h while not tolerating PO -carb modified diet -sSSI - goal CBGs at the moment <180, will work for tighter control as she improves  H/o RA  Myositis Discussed case with Dr. 03-26-1970 of [facility], patient sees Dr. 32m (off today). Patient had muscle biopsy that was nonspecific, greatest concern for crush injury. Plan was to continue oral prednisone taper, patient should be on 30mg  daily this week, and remain on 30mg  daily until 1 special test at [facility] returns for necrotizing myopathy (thought to be unlikely). - Plan to decrease IV methylpred to 30mg  prednisone equivalent -Patient to follow up with Dr. and [facility]  H/O TIA Lipid panel wnl except HDL 32. Home medication lipitor 40 mg, ASA 81mg . - Will hold today while NPO, but can restart afterward if able to tolerate with anti-emetic  Constipation - Hold today for NPO -Can restart tomorrow if able to tolerate  Normocytic Anemia Hgb 9.8, stable from yesterday, was 12.9 on admission, has been receiving IV fluids, could have slight dilutional component.  MCV 96.1.  Was around 11 on previous admission. - monitor CBC - consider outpatient colonoscopy  FEN/GI: NPO Prophylaxis: lovenox  Disposition: pending continued treatment of N/V, eventually home with [facility] - orders in  Subjective:  Patient very tired from procedure earlier today.  Objective: Temp:  [97.9 F (36.6 C)-98.5 F (  36.9 C)] 98.3 F (36.8 C) (03/22 0520) Pulse Rate:  [109-116] 116 (03/22 0520) Resp:  [17-19] 17 (03/22 0520) BP:  (124-134)/(66-69) 124/67 (03/22 0520) SpO2:  [90 %-92 %] 90 % (03/22 0520)  Physical Exam:  General: older African woman resting comfortably in bed, in NAD Cardio: RRR no m/r/g Lungs: CTAB, no wheezing, no rhonchi, no crackles, no IWOB on RA Abdomen: Soft, mildly tender in epigastric region, ND, normal bowel sounds present Skin: warm and dry Extremities: No edema   Laboratory: Recent Labs  Lab 09/05/19 0328 09/06/19 0751 09/07/19 0411  WBC 5.5 5.3 5.5  HGB 10.0* 10.0* 9.8*  HCT 29.3* 29.4* 28.8*  PLT 124* 124* 129*   Recent Labs  Lab 09/04/19 0026 09/04/19 0026 09/04/19 0759 09/04/19 1700 09/05/19 0328 09/05/19 0328 09/06/19 0751 09/06/19 1413 09/07/19 0411  NA 123*  --   --    < > 130*   < > 125* 125* 129*  K 4.5  --   --    < > 4.2   < > 3.7 4.2 3.3*  CL 88*  --   --    < > 102   < > 100 100 103  CO2 21*  --   --    < > 19*   < > 18* 17* 18*  BUN 19  --   --    < > 12   < > 8 9 6*  CREATININE 1.04*  --   --    < > 0.73   < > 0.66 0.56 0.51  CALCIUM 8.1*  --   --    < > 7.1*   < > 7.1* 7.1* 7.1*  PROT 5.8*  --   --   --  4.6*  --  4.5*  --   --   BILITOT 1.7*   < > 1.5*  --  1.3*  --  1.0  --   --   ALKPHOS 69  --   --   --  52  --  57  --   --   ALT 37  --   --   --  28  --  29  --   --   AST 33  --   --   --  24  --  31  --   --   GLUCOSE 195*  --   --    < > 150*   < > 161* 283* 144*   < > = values in this interval not displayed.   hepC quant hepC genotype Blood culture TSH covid screening Bilirubin fractionated Urine osmolality  Imaging/Diagnostic Tests: No results found.   Gladys Damme, MD 09/07/2019, 8:02 AM PGY-1, [place] Intern pager: [phone], text pages welcome

## 2019-09-07 NOTE — Anesthesia Procedure Notes (Signed)
Procedure Name: MAC Date/Time: 09/07/2019 9:35 AM Performed by: Imagene Riches, CRNA Pre-anesthesia Checklist: Patient identified, Emergency Drugs available, Suction available, Patient being monitored and Timeout performed Patient Re-evaluated:Patient Re-evaluated prior to induction Oxygen Delivery Method: Nasal cannula

## 2019-09-07 NOTE — Progress Notes (Signed)
OT Cancellation Note  Patient Details Name: Tracey Riddle MRN: [id number] DOB: 04/13/1951   Cancelled Treatment:    Reason Eval/Treat Not Completed: Patient at procedure or test/ unavailable- off unit at EGD.  Will follow and see as able.   Barry Brunner, OT Acute Rehabilitation Services Pager [phone] Office [phone]    Tracey Riddle 09/07/2019, 10:10 AM

## 2019-09-07 NOTE — Anesthesia Preprocedure Evaluation (Addendum)
Anesthesia Evaluation  Patient identified by MRN, date of birth, ID band Patient awake    Reviewed: Allergy & Precautions, NPO status , Patient's Chart, lab work & pertinent test results, reviewed documented beta blocker date and time   Airway Mallampati: III  TM Distance: >3 FB Neck ROM: Full    Dental no notable dental hx. (+) Chipped, Dental Advisory Given,    Pulmonary neg pulmonary ROS,    Pulmonary exam normal breath sounds clear to auscultation       Cardiovascular hypertension, Pt. on medications and Pt. on home beta blockers Normal cardiovascular exam Rhythm:Regular Rate:Normal     Neuro/Psych negative neurological ROS  negative psych ROS   GI/Hepatic GERD  Medicated,(+) Cirrhosis       , Hepatitis -, C  Endo/Other  negative endocrine ROSdiabetes, Type 2, Insulin DependentHyponatremia Na 129  Renal/GU negative Renal ROS  negative genitourinary   Musculoskeletal negative musculoskeletal ROS (+)   Abdominal   Peds  Hematology  (+) Blood dyscrasia (Hgb 9.8), anemia ,   Anesthesia Other Findings EGD for epigastric pain, N/V  Reproductive/Obstetrics                            Anesthesia Physical Anesthesia Plan  ASA: III  Anesthesia Plan: MAC   Post-op Pain Management:    Induction: Intravenous  PONV Risk Score and Plan: 2 and Propofol infusion and Treatment may vary due to age or medical condition  Airway Management Planned: Natural Airway  Additional Equipment:   Intra-op Plan:   Post-operative Plan:   Informed Consent: I have reviewed the patients History and Physical, chart, labs and discussed the procedure including the risks, benefits and alternatives for the proposed anesthesia with the patient or authorized representative who has indicated his/her understanding and acceptance.     Dental advisory given  Plan Discussed with: CRNA  Anesthesia Plan Comments:          Anesthesia Quick Evaluation

## 2019-09-08 DIAGNOSIS — B780 Intestinal strongyloidiasis: Principal | ICD-10-CM

## 2019-09-08 DIAGNOSIS — Z8616 Personal history of COVID-19: Secondary | ICD-10-CM

## 2019-09-08 DIAGNOSIS — Z79899 Other long term (current) drug therapy: Secondary | ICD-10-CM

## 2019-09-08 DIAGNOSIS — B3781 Candidal esophagitis: Secondary | ICD-10-CM

## 2019-09-08 DIAGNOSIS — B182 Chronic viral hepatitis C: Secondary | ICD-10-CM | POA: Diagnosis present

## 2019-09-08 DIAGNOSIS — Z8619 Personal history of other infectious and parasitic diseases: Secondary | ICD-10-CM | POA: Diagnosis present

## 2019-09-08 DIAGNOSIS — D84821 Immunodeficiency due to drugs: Secondary | ICD-10-CM

## 2019-09-08 DIAGNOSIS — K2980 Duodenitis without bleeding: Secondary | ICD-10-CM

## 2019-09-08 DIAGNOSIS — Z7952 Long term (current) use of systemic steroids: Secondary | ICD-10-CM

## 2019-09-08 LAB — GLUCOSE, CAPILLARY
Glucose-Capillary: 89 mg/dL (ref 70–99)
Glucose-Capillary: 130 mg/dL — ABNORMAL HIGH (ref 70–99)
Glucose-Capillary: 254 mg/dL — ABNORMAL HIGH (ref 70–99)

## 2019-09-08 LAB — CBC
HCT: 26.1 % — ABNORMAL LOW (ref 36.0–46.0)
Hemoglobin: 9.1 g/dL — ABNORMAL LOW (ref 12.0–15.0)
MCH: 33.7 pg (ref 26.0–34.0)
MCHC: 34.9 g/dL (ref 30.0–36.0)
MCV: 96.7 fL (ref 80.0–100.0)
Platelets: 112 10*3/uL — ABNORMAL LOW (ref 150–400)
RBC: 2.7 MIL/uL — ABNORMAL LOW (ref 3.87–5.11)
RDW: 13.2 % (ref 11.5–15.5)
WBC: 5.3 10*3/uL (ref 4.0–10.5)
nRBC: 2.3 % — ABNORMAL HIGH (ref 0.0–0.2)

## 2019-09-08 LAB — SURGICAL PATHOLOGY

## 2019-09-08 LAB — MAGNESIUM: Magnesium: 1.8 mg/dL (ref 1.7–2.4)

## 2019-09-08 LAB — BASIC METABOLIC PANEL
Anion gap: 4 — ABNORMAL LOW (ref 5–15)
BUN: 5 mg/dL — ABNORMAL LOW (ref 8–23)
CO2: 18 mmol/L — ABNORMAL LOW (ref 22–32)
Calcium: 6.5 mg/dL — ABNORMAL LOW (ref 8.9–10.3)
Chloride: 109 mmol/L (ref 98–111)
Creatinine, Ser: 0.52 mg/dL (ref 0.44–1.00)
GFR calc Af Amer: >60 mL/min (ref >60)
GFR calc non Af Amer: >60 mL/min (ref >60)
Glucose, Bld: 68 mg/dL — ABNORMAL LOW (ref 70–99)
Potassium: 3.4 mmol/L — ABNORMAL LOW (ref 3.5–5.1)
Sodium: 131 mmol/L — ABNORMAL LOW (ref 135–145)

## 2019-09-08 LAB — CYTOLOGY - NON PAP

## 2019-09-08 MED ORDER — FLUCONAZOLE 100 MG PO TABS
200.0000 mg | ORAL_TABLET | Freq: Every day | ORAL | Status: DC
  Administered 2019-09-09 – 2019-09-14 (×6): 200 mg via ORAL
  Filled 2019-09-08 (×6): qty 2

## 2019-09-08 MED ORDER — IVERMECTIN 3 MG PO TABS
200.0000 ug/kg | ORAL_TABLET | Freq: Once | ORAL | Status: AC
  Administered 2019-09-08: 18:00:00 18000 ug via ORAL
  Filled 2019-09-08: qty 6

## 2019-09-08 MED ORDER — FLUCONAZOLE IN SODIUM CHLORIDE 200-0.9 MG/100ML-% IV SOLN
200.0000 mg | INTRAVENOUS | Status: DC
  Administered 2019-09-08: 200 mg via INTRAVENOUS
  Filled 2019-09-08: qty 100

## 2019-09-08 MED ORDER — POTASSIUM CHLORIDE 10 MEQ/100ML IV SOLN
10.0000 meq | INTRAVENOUS | Status: AC
  Administered 2019-09-08 (×4): 10 meq via INTRAVENOUS
  Filled 2019-09-08 (×4): qty 100

## 2019-09-08 MED ORDER — ZINC OXIDE 40 % EX OINT
1.0000 "application " | TOPICAL_OINTMENT | Freq: Every day | CUTANEOUS | Status: DC
  Administered 2019-09-11 – 2019-09-15 (×3): 1 via TOPICAL
  Filled 2019-09-08 (×3): qty 57

## 2019-09-08 MED ORDER — SUCRALFATE 1 GM/10ML PO SUSP
1.0000 g | Freq: Three times a day (TID) | ORAL | Status: DC
  Administered 2019-09-08 – 2019-09-09 (×5): 1 g via ORAL
  Filled 2019-09-08 (×5): qty 10

## 2019-09-08 NOTE — Progress Notes (Signed)
Physical Therapy Treatment Patient Details Name: Tracey Riddle MRN: [id number] DOB: Sep 20, 1950 Today's Date: 09/08/2019    History of Present Illness Pt is a 69 y/o female admitted secondary to worsening abdominal pain. Found to have hyponatremia. Pt with recent admission for sepsis from viral gastroenteritis. S/P EGD 3/23.PMH includes CKD, DM, and TIA.     PT Comments    Patient progressing slowly, but able to ambulate some with much assist from [facility] to recliner.  Needs much encouragement and assist due to significant fear of falling and generalized weakness.  Family supportive and still hopeful to take pt home with follow up HHPT and 24 hour assist. May also need hospital bed for home.  PT to follow.   Follow Up Recommendations  Home health PT;Supervision/Assistance - 24 hour     Equipment Recommendations  Wheelchair (measurements PT);Wheelchair cushion (measurements PT);Hospital bed    Recommendations for Other Services       Precautions / Restrictions Precautions Precautions: Fall    Mobility  Bed Mobility Overal bed mobility: Needs Assistance Bed Mobility: Supine to Sit     Supine to sit: Mod assist     General bed mobility comments: pt requires assist for LB and trunk support, able to initate but requires assist due to weakness   Transfers Overall transfer level: Needs assistance Equipment used: Rolling walker (2 wheeled) Transfers: Sit to/from Stand Sit to Stand: Min assist;+2 physical assistance         General transfer comment: pt requires cueing for hand placement and sequencing; min asisst +2 or mod assist +1 to power up and steady from EOB/BSC   Ambulation/Gait Ambulation/Gait assistance: Mod assist;+2 safety/equipment Gait Distance (Feet): 4 Feet Assistive device: Rolling walker (2 wheeled) Gait Pattern/deviations: Step-to pattern;Decreased stride length;Wide base of support;Shuffle     General Gait Details: significant fear of falling and increased  assist provided to improve comfort/safety   Stairs             Wheelchair Mobility    Modified Rankin (Stroke Patients Only)       Balance Overall balance assessment: Needs assistance Sitting-balance support: No upper extremity supported;Feet supported Sitting balance-Leahy Scale: Good Sitting balance - Comments: supervision   Standing balance support: Bilateral upper extremity supported;During functional activity Standing balance-Leahy Scale: Poor Standing balance comment: reliant on BUE and  external support                            Cognition Arousal/Alertness: Awake/alert Behavior During Therapy: Flat affect Overall Cognitive Status: Difficult to assess                                 General Comments: niece interpreting, following commands with increased time, anticipate limited due to fatigue       Exercises      General Comments General comments (skin integrity, edema, etc.): Encouraged OOB and participation in mobility to toilet at least twice a day      Pertinent Vitals/Pain Pain Score: 6  Pain Location: abdomen Pain Descriptors / Indicators: Discomfort Pain Intervention(s): Monitored during session;Repositioned;Limited activity within patient's tolerance    Home Living                      Prior Function            PT Goals (current goals can now be found in the care  plan section) Progress towards PT goals: Progressing toward goals    Frequency    Min 3X/week      PT Plan Current plan remains appropriate    Co-evaluation PT/OT/SLP Co-Evaluation/Treatment: Yes Reason for Co-Treatment: For patient/therapist safety;To address functional/ADL transfers PT goals addressed during session: Mobility/safety with mobility;Balance;Proper use of DME        AM-PAC PT "6 Clicks" Mobility   Outcome Measure  Help needed turning from your back to your side while in a flat bed without using bedrails?: A  Little Help needed moving from lying on your back to sitting on the side of a flat bed without using bedrails?: A Lot Help needed moving to and from a bed to a chair (including a wheelchair)?: A Lot Help needed standing up from a chair using your arms (e.g., wheelchair or bedside chair)?: A Lot Help needed to walk in hospital room?: A Lot Help needed climbing 3-5 steps with a railing? : Total 6 Click Score: 12    End of Session   Activity Tolerance: Patient limited by fatigue Patient left: in chair;with call bell/phone within reach;with family/visitor present;with chair alarm set   PT Visit Diagnosis: Other abnormalities of gait and mobility (R26.89);Muscle weakness (generalized) (M62.81)     Time: 1443-1540 PT Time Calculation (min) (ACUTE ONLY): 28 min  Charges:  $Gait Training: 8-22 mins                     Magda Kiel, [place] [phone] 09/08/2019    Reginia Naas 09/08/2019, 3:55 PM

## 2019-09-08 NOTE — Progress Notes (Signed)
[facility] Gastroenterology Progress Note  Tracey Riddle 69 y.o. November 18, 1950   Subjective: Abdominal pain with nausea/vomiting last night. Very thirsty. No BMs. Niece at bedside.  Objective: Vital signs: Vitals:   09/07/19 2300 09/08/19 0425  BP:  (!) 109/59  Pulse:  (!) 112  Resp:  18  Temp:  99.6 F (37.6 C)  SpO2: 94% 94%    Physical Exam: Gen: lethargic, elderly, pleasant, no acute distress  HEENT: anicteric sclera CV: RRR Chest: CTA B Abd: epigastric tenderness with guarding, soft, nondistended, +BS Ext: no edema  Lab Results: Recent Labs    09/07/19 0411 09/08/19 0259  NA 129* 131*  K 3.3* 3.4*  CL 103 109  CO2 18* 18*  GLUCOSE 144* 68*  BUN 6* 5*  CREATININE 0.51 0.52  CALCIUM 7.1* 6.5*  MG 1.6*  --    Recent Labs    09/06/19 0751  AST 31  ALT 29  ALKPHOS 57  BILITOT 1.0  PROT 4.5*  ALBUMIN 1.8*   Recent Labs    09/07/19 0411 09/08/19 0259  WBC 5.5 5.3  HGB 9.8* 9.1*  HCT 28.8* 26.1*  MCV 97.6 96.7  PLT 129* 112*      Assessment/Plan: Severe duodenitis and mild gastritis - biopsies pending. Ischemia possible. Start clear liquid diet. Supportive care. Will follow.   [name] 09/08/2019, 9:49 AM  Questions please call [phone] ID: Tracey Riddle, female   DOB: 12/15/1950, 69 y.o.   MRN: [id number]

## 2019-09-08 NOTE — Progress Notes (Signed)
Family Medicine Teaching Service Daily Progress Note Intern Pager: [phone]  Patient name: Tracey Riddle Medical record number: [id number] Date of birth: Sep 14, 1950 Age: 69 y.o. Gender: female  Primary Care Provider: System, Pcp Not In Consultants: GI Code Status: full  Pt Overview and Major Events to Date:  3/19- admitted 3/22- EGD  Assessment and Plan: Tracey Riddle is a 69 y.o. female presenting with intractable abdominal pain and nausea. 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 Also found mild gastritis and severe duodenitis, pathology results pending. GI recommends to have patient start CLD, observe clinical course, await pathology results, and give protonix with 40mg  IV bolus and then 8mg /hr IV continuous infusion after that. Will continue treatment with fluconazole today. Patient quite hungry, had nausea and emesis x2 last night. Consideration of possible abdominal ischemia due to severe duodenitis, concern this could be a sequela of hepatitis c. Will consult ID for recommendations. On exam patient does not have pain out of proportion to exam, overall benign exam, mildly diffuse pain. Can also consider CTA abdomen if increasing abdominal pain or no clinical improvement with protonix and diflucan. -GI following appreciate recommendations.  -ID consulted, appreciate recommendations -Protonix: 40mg  IV bolus followed by 8mg /hr IV continuous infusion -CLD -F/U biopsy results -Fluconazole 200 mg IV   Hepatitis C RUQ TI:RWERXVQMG liver with lobulated surface, no mass or ascites. Negative gallbladder.  AST, ALT wnl. Total bili 1.7>1.5>1.3>1.0, direct= 0.3, indirect 1.2. Quant Hep C: 4.5 million. -f/u hepC genotype  in process -ID consulted, see above -GI following,  will likely manage as outpatient -Consider hep B vaccine prior to dc?  Bacteremia- resolved Patient completed treatment for Pseudomonas and E. coli bacteremia 3/21.  Blood cultures on this admission no growth at 4 days. Urine culture positive for 60K CFU yeast. Treating possible esophageal candidiasis with fluconazole, see above. - levaquin (3/15-3/18) - cefepime (3/19-3/21)  Hypotonic Hyponatremia Na 123>130>125>129>131. CLD, will continue IVF today. - NS 146mL/hr - strict I/Os - BMP daily  DMT2 CBGs 867-619-50. Most recently 130. Received Aspart 6U yesterday, methylprednisolone 24mg  daily.  A1c 8%. Home medications include lispro 5-10U before meals. -CBG BID  -sSSI - goal CBGs at the moment <180  H/o RA  Myositis Discussed case with Dr. Brien Mates of [facility], patient sees Dr. Shearon Stalls (off today). Patient had muscle biopsy that was nonspecific, greatest concern for crush injury. Plan was to continue oral prednisone taper, patient should be on 30mg  daily this week, and remain on 30mg  daily until 1 special test at [facility] returns for necrotizing myopathy (thought to be unlikely). - Plan to decrease IV methylpred to 30mg  prednisone equivalent -Patient to follow up with Dr. Shearon Stalls and [facility]  H/O TIA Lipid panel wnl except HDL 32. Home medication lipitor 40 mg, ASA 81mg . - Will hold today while NPO, but can restart afterward if able to tolerate with anti-emetic  Constipation - Hold today for NPO -Can restart tomorrow if able to tolerate  Normocytic Anemia Hgb 9.8, stable from yesterday, was 12.9 on admission, has been receiving IV fluids, could have slight dilutional component.  MCV 96.1.  Was around 11 on previous admission. - monitor CBC - consider outpatient colonoscopy  FEN/GI: NPO Prophylaxis: lovenox  Disposition: pending continued treatment of N/V, eventually home with [facility] - orders in  Subjective:  Patient reports nausea and emesis x2 last night, very  hungry today, wants to eat.  Objective: Temp:  [97.5 F (36.4 C)-99.6 F (37.6 C)] 99.6 F (37.6 C) (03/23 0425) Pulse Rate:  [107-120] 112 (03/23 0425) Resp:  [17-27] 18 (03/23 0425) BP: (83-130)/(38-104) 109/59 (03/23 0425) SpO2:  [92 %-96 %] 94 % (03/23 0425) Weight:  [86.9 kg] 86.9 kg (03/22 0831)  Physical Exam:  General: older African woman resting comfortably in bed, in NAD Cardio: RRR no m/r/g Lungs: CTAB, no wheezing, no rhonchi, no crackles, no IWOB on RA Abdomen: Soft, mildly tender in epigastric region, ND, normal bowel sounds present Skin: erythema along inguinal crease R>L Extremities: No edema   Laboratory: Recent Labs  Lab 09/06/19 0751 09/07/19 0411 09/08/19 0259  WBC 5.3 5.5 5.3  HGB 10.0* 9.8* 9.1*  HCT 29.4* 28.8* 26.1*  PLT 124* 129* 112*   Recent Labs  Lab 09/04/19 0026 09/04/19 0026 09/04/19 0759 09/04/19 1700 09/05/19 0328 09/05/19 0328 09/06/19 0751 09/06/19 0751 09/06/19 1413 09/07/19 0411 09/08/19 0259  NA 123*  --   --    < > 130*   < > 125*   < > 125* 129* 131*  K 4.5  --   --    < > 4.2   < > 3.7   < > 4.2 3.3* 3.4*  CL 88*  --   --    < > 102   < > 100   < > 100 103 109  CO2 21*  --   --    < > 19*   < > 18*   < > 17* 18* 18*  BUN 19  --   --    < > 12   < > 8   < > 9 6* 5*  CREATININE 1.04*  --   --    < > 0.73   < > 0.66   < > 0.56 0.51 0.52  CALCIUM 8.1*  --   --    < > 7.1*   < > 7.1*   < > 7.1* 7.1* 6.5*  PROT 5.8*  --   --   --  4.6*  --  4.5*  --   --   --   --   BILITOT 1.7*   < > 1.5*  --  1.3*  --  1.0  --   --   --   --   ALKPHOS 69  --   --   --  52  --  57  --   --   --   --   ALT 37  --   --   --  28  --  29  --   --   --   --   AST 33  --   --   --  24  --  31  --   --   --   --   GLUCOSE 195*  --   --    < > 150*   < > 161*   < > 283* 144* 68*   < > = values in this interval not displayed.    Imaging/Diagnostic Tests: No results found.   Shirlean Mylar, MD 09/08/2019, 7:57 AM PGY-1, [facility] Family  Medicine FPTS Intern pager: [phone], text pages welcome

## 2019-09-08 NOTE — Progress Notes (Signed)
Occupational Therapy Treatment Patient Details Name: Tracey Riddle MRN: [id number] DOB: 1950-07-17 Today's Date: 09/08/2019    History of present illness Pt is a 69 y/o female admitted secondary to worsening abdominal pain. Found to have hyponatremia. Pt with recent admission for sepsis from viral gastroenteritis. S/P EGD 3/23.PMH includes CKD, DM, and TIA.    OT comments  Patient supine in bed and agreeable to OT.  Patient remains limited by decreased activity tolerance, generalized weakness and abdominal pain.  Patient completing transfers to [facility] with min assist +2 (or mod assist +1) using RW, cueing for hand placement and sequencing.   Niece present and translating during session, reports pt very fearful of falling (voices fear with each stand). She requires increased time and encouragement for all activities.  Will follow acutely.    Follow Up Recommendations  Home health OT;Supervision/Assistance - 24 hour;Other (comment)(refusing SNF, needs aide )    Equipment Recommendations  3 in 1 bedside commode;Wheelchair (measurements OT);Wheelchair cushion (measurements OT)    Recommendations for Other Services      Precautions / Restrictions Precautions Precautions: Fall Restrictions Weight Bearing Restrictions: No       Mobility Bed Mobility Overal bed mobility: Needs Assistance Bed Mobility: Supine to Sit     Supine to sit: Mod assist     General bed mobility comments: pt requires assist for LB and trunk support, able to initate but requires assist due to weakness   Transfers Overall transfer level: Needs assistance Equipment used: Rolling walker (2 wheeled) Transfers: Sit to/from Stand Sit to Stand: Min assist;+2 physical assistance         General transfer comment: pt requires cueing for hand placement and sequencing; min asisst +2 or mod assist +1 to power up and steady from EOB/BSC     Balance Overall balance assessment: Needs assistance Sitting-balance support:  No upper extremity supported;Feet supported Sitting balance-Leahy Scale: Good Sitting balance - Comments: supervision   Standing balance support: Bilateral upper extremity supported;During functional activity Standing balance-Leahy Scale: Poor Standing balance comment: reliant on BUE and  external support                           ADL either performed or assessed with clinical judgement   ADL Overall ADL's : Needs assistance/impaired                         Toilet Transfer: Minimal assistance;+2 for physical assistance;+2 for safety/equipment;Stand-pivot;BSC;RW Toilet Transfer Details (indicate cue type and reason): to [facility] with RW  Toileting- Clothing Manipulation and Hygiene: Moderate assistance;Sit to/from stand Toileting - Clothing Manipulation Details (indicate cue type and reason): clothing mgmt      Functional mobility during ADLs: Minimal assistance;+2 for physical assistance;+2 for safety/equipment;Rolling walker General ADL Comments: pt limited by generalized weakness and decreased activity tolerance for safe and independent BADLs     Vision       Perception     Praxis      Cognition Arousal/Alertness: Awake/alert Behavior During Therapy: Flat affect Overall Cognitive Status: Difficult to assess                                 General Comments: niece interpreting, following commands with increased time, anticipate limited due to fatigue         Exercises     Shoulder Instructions  General Comments educated on increasing sitting tolerance and beginning to complete ADL tasks as able to increase strength and activity tolerance    Pertinent Vitals/ Pain       Pain Assessment: 0-10 Pain Score: 6  Pain Location: abdomen Pain Descriptors / Indicators: Discomfort Pain Intervention(s): Limited activity within patient's tolerance;Monitored during session;Repositioned  Home Living                                           Prior Functioning/Environment              Frequency  Min 3X/week        Progress Toward Goals  OT Goals(current goals can now be found in the care plan section)  Progress towards OT goals: Progressing toward goals  Acute Rehab OT Goals Patient Stated Goal: to get stronger and feel better OT Goal Formulation: With patient  Plan Discharge plan remains appropriate;Frequency remains appropriate    Co-evaluation    PT/OT/SLP Co-Evaluation/Treatment: Yes Reason for Co-Treatment: For patient/therapist safety;To address functional/ADL transfers   OT goals addressed during session: ADL's and self-care      AM-PAC OT "6 Clicks" Daily Activity     Outcome Measure   Help from another person eating meals?: None Help from another person taking care of personal grooming?: A Little Help from another person toileting, which includes using toliet, bedpan, or urinal?: A Lot Help from another person bathing (including washing, rinsing, drying)?: A Lot Help from another person to put on and taking off regular upper body clothing?: A Little Help from another person to put on and taking off regular lower body clothing?: A Lot 6 Click Score: 16    End of Session Equipment Utilized During Treatment: Gait belt;Rolling walker  OT Visit Diagnosis: Unsteadiness on feet (R26.81);Other abnormalities of gait and mobility (R26.89);Muscle weakness (generalized) (M62.81)   Activity Tolerance Patient limited by fatigue   Patient Left in chair;with call bell/phone within reach;with family/visitor present   Nurse Communication Mobility status        Time: 1008-1040 OT Time Calculation (min): 32 min  Charges: OT General Charges $OT Visit: 1 Visit OT Treatments $Self Care/Home Management : 8-22 mins  Barry Brunner, OT Acute Rehabilitation Services Pager [phone] Office [phone]    Tracey Riddle 09/08/2019, 11:29 AM
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
## 2019-09-08 NOTE — Progress Notes (Signed)
Patient ID: Tracey Riddle, female   DOB: [birth year], 69 y.o.   MRN: [id number]   Gastric biopsies showed Strongyloides Stercoralis and likely chronic infection and will need Ivermectin but would recommend Infectious Disease consult. Informed primary team who will call ID consult.

## 2019-09-09 DIAGNOSIS — B1920 Unspecified viral hepatitis C without hepatic coma: Secondary | ICD-10-CM

## 2019-09-09 LAB — BASIC METABOLIC PANEL
Anion gap: 7 (ref 5–15)
BUN: <5 mg/dL — ABNORMAL LOW (ref 8–23)
CO2: 15 mmol/L — ABNORMAL LOW (ref 22–32)
Calcium: 6.6 mg/dL — ABNORMAL LOW (ref 8.9–10.3)
Chloride: 106 mmol/L (ref 98–111)
Creatinine, Ser: 0.6 mg/dL (ref 0.44–1.00)
GFR calc Af Amer: >60 mL/min (ref >60)
GFR calc non Af Amer: >60 mL/min (ref >60)
Glucose, Bld: 201 mg/dL — ABNORMAL HIGH (ref 70–99)
Potassium: 3.8 mmol/L (ref 3.5–5.1)
Sodium: 128 mmol/L — ABNORMAL LOW (ref 135–145)

## 2019-09-09 LAB — CBC
HCT: 27.2 % — ABNORMAL LOW (ref 36.0–46.0)
Hemoglobin: 9.1 g/dL — ABNORMAL LOW (ref 12.0–15.0)
MCH: 32.6 pg (ref 26.0–34.0)
MCHC: 33.5 g/dL (ref 30.0–36.0)
MCV: 97.5 fL (ref 80.0–100.0)
Platelets: 117 10*3/uL — ABNORMAL LOW (ref 150–400)
RBC: 2.79 MIL/uL — ABNORMAL LOW (ref 3.87–5.11)
RDW: 13.4 % (ref 11.5–15.5)
WBC: 4.2 10*3/uL (ref 4.0–10.5)
nRBC: 1.7 % — ABNORMAL HIGH (ref 0.0–0.2)

## 2019-09-09 LAB — CULTURE, BLOOD (ROUTINE X 2)
Culture: NO GROWTH
Culture: NO GROWTH
Special Requests: ADEQUATE

## 2019-09-09 LAB — GLUCOSE, CAPILLARY
Glucose-Capillary: 201 mg/dL — ABNORMAL HIGH (ref 70–99)
Glucose-Capillary: 190 mg/dL — ABNORMAL HIGH (ref 70–99)
Glucose-Capillary: 242 mg/dL — ABNORMAL HIGH (ref 70–99)

## 2019-09-09 LAB — HEPATITIS C GENOTYPE: HCV Genotype: 4

## 2019-09-09 MED ORDER — ACETAMINOPHEN 325 MG PO TABS
650.0000 mg | ORAL_TABLET | Freq: Four times a day (QID) | ORAL | Status: DC | PRN
  Administered 2019-09-09 – 2019-09-11 (×2): 650 mg via ORAL
  Filled 2019-09-09 (×2): qty 2

## 2019-09-09 MED ORDER — IVERMECTIN 3 MG PO TABS
200.0000 ug/kg | ORAL_TABLET | Freq: Every evening | ORAL | Status: DC
  Administered 2019-09-09 – 2019-09-15 (×7): 18000 ug via ORAL
  Filled 2019-09-09 (×7): qty 6

## 2019-09-09 MED ORDER — IVERMECTIN 3 MG PO TABS: 200.0000 ug/kg | ORAL_TABLET | Freq: Every day | ORAL | 0 refills | Status: AC

## 2019-09-09 MED ORDER — GLYCERIN (LAXATIVE) 2.1 G RE SUPP
1.0000 | RECTAL | Status: AC | PRN
  Administered 2019-09-09: 15:00:00 1 via RECTAL
  Filled 2019-09-09: qty 1

## 2019-09-09 MED FILL — IVERMECTIN 3 MG TABLET: 3 | 12 days supply | Qty: 72 | Fill #0

## 2019-09-09 NOTE — Progress Notes (Signed)
PT Cancellation Note  Patient Details Name: Tracey Riddle MRN: [id number] DOB: December 31, 1950   Cancelled Treatment:    Reason Eval/Treat Not Completed: Patient declined, no reason specified Pt stated that she wanted to work with PT tomorrow vs today. PT will continue following.  Oda Cogan PT, DPT Acute Rehab [facility] P: [phone]   Newman Nickels 09/09/2019, 4:25 PM

## 2019-09-09 NOTE — Progress Notes (Signed)
Family Medicine Teaching Service Daily Progress Note Intern Pager: [phone]  Patient name: Tracey Riddle Medical record number: [id number] Date of birth: Sep 20, 1950 Age: 69 y.o. Gender: female  Primary Care Provider: System, Pcp Not In Consultants: GI Code Status: full  Pt Overview and Major Events to Date:  3/19- admitted 3/22- EGD 3/23- Strongyloides on surgical pathology  Assessment and Plan: Tracey Riddle is a 69 y.o. female presenting with intractable abdominal pain and nausea. 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 Consulted infectious disease, who recommended treating with ivermectin. Strongyloidiasis was likely triggered by recent prednisone therapy, and also explains the bacteremia found at her last admission. Strongyloides larvae are strongly associated with polymicrobial GNR bacteremia as the larvae transit the GI system into systemic circulation. This patient had both pseudomonas and e. Coli bacteremia at her last admission. GI recommends continuing PPI infusion overnight and switching to protonix 40 mg IV BID tomorrow. Discussed this case with patient's neurologist, Dr. Celine Mans at [facility], will continue taper of prednisone by 10mg  qweekly. Overnight, patient had some emesis and is still only tolerating minimal fluids by mouth. Will continue anti-emetics and IVF for now, expect that she will begin to feel better as ivermectin and fluconazole treatments continue. -GI following appreciate recommendations.  -ID consulted, appreciate recommendations -Protonix: 40mg  IV bolus followed by 8mg /hr IV continuous infusion to end tomorrow, then start 40mg  IV BID -CLD, ADAT -Ivermectin day 2/14 -Fluconazole 200 mg PO daily, day 3/21 -Methylprednisolone 24mg  IV daily  Hepatitis C RUQ liver with  lobulated surface, no mass or ascites. Negative gallbladder.  AST, ALT wnl. Total bili 1.7>1.5>1.3>1.0, direct= 0.3, indirect 1.2. Quant Hep C: 4.5 million. -f/u hepC genotype 4 -ID consulted, will treat in the outpatient setting -Will give Hepatitis B vaccines prior to discharge  Bacteremia- resolved Patient completed treatment for Pseudomonas and E. coli bacteremia 3/21.  Blood cultures on this admission no growth at 4 days. Urine culture positive for 60K CFU yeast. Treating esophageal candidiasis with fluconazole, see above. - levaquin (3/15-3/18) - cefepime (3/19-3/21)  Hypotonic Hyponatremia Na 123>130>125>129>131>128. Patient with continued emesis overnight, likely cause of worsened hyponatremia today. CLD, will continue IVF today. - NS 156mL/hr - strict I/Os - BMP daily  DMT2 CBGs 130-254-190. Received Aspart 6U yesterday, methylprednisolone 24mg  daily.  A1c 8%. Home medications include lispro 5-10U before meals. -CBG BID  -sSSI - goal CBGs at the moment <180  H/o RA  Myositis Discussed case with Dr. ON:GEXBMWUXL of [facility] today. Patient had muscle biopsy that was nonspecific, greatest concern for crush injury. Plan was to continue oral prednisone taper, patient should be on 30mg  daily this week, and then taper down by 10mg  each week until reaches 10mg  daily equivalent of prednisone. - Plan to decrease IV methylpred to 30mg  prednisone equivalent, will decrease to 20mg  prednisone equivalent after 1 week (March 29). -Patient to follow up with Dr. 03-26-1970 and [facility]  H/O TIA Lipid panel wnl except HDL 32. Home medication lipitor 40 mg, ASA 81mg . - Plan to restart lipitor tomorrow if tolerating medications by mouth -Continue ASA 81 mg  Constipation Patient has not had a bowel movement, but has not had significant intake due to nausea and emesis. Will add in miralax as patient tolerates more solid food, but no indication to start yet.  Normocytic Anemia Hgb  9.1, stable from yesterday, was 12.9 on admission,  has been receiving IV fluids, could have slight dilutional component.  MCV 96.1.  Was around 11 on previous admission. - monitor CBC - consider outpatient colonoscopy  FEN/GI: CLD, ADAT Prophylaxis: lovenox  Disposition: pending continued treatment of infections, eventually home with [facility] - orders in  Subjective:  Patient reports several episodes of emesis overnight, but she is very excited to hear that we have a diagnosis and have begun treatment  Objective: Temp:  [97.9 F (36.6 C)-98.7 F (37.1 C)] 97.9 F (36.6 C) (03/24 0428) Pulse Rate:  [98-108] 108 (03/24 0428) Resp:  [18] 18 (03/24 0428) BP: (111-130)/(61-74) 130/74 (03/24 0428) SpO2:  [93 %-96 %] 93 % (03/24 0428)  Physical Exam:  General: older African woman resting comfortably in bed, in NAD Cardio: RRR no m/r/g Lungs: CTAB, no wheezing, no rhonchi, no crackles, no IWOB on RA Abdomen: Soft, mildly tender in epigastric region, ND, normal bowel sounds present Skin: erythema along inguinal crease R>L Extremities: No edema   Laboratory: Recent Labs  Lab 09/07/19 0411 09/08/19 0259 09/09/19 0422  WBC 5.5 5.3 4.2  HGB 9.8* 9.1* 9.1*  HCT 28.8* 26.1* 27.2*  PLT 129* 112* 117*   Recent Labs  Lab 09/04/19 0026 09/04/19 0026 09/04/19 0759 09/04/19 1700 09/05/19 0328 09/05/19 0328 09/06/19 0751 09/06/19 1413 09/07/19 0411 09/08/19 0259 09/09/19 0422  NA 123*  --   --    < > 130*   < > 125*   < > 129* 131* 128*  K 4.5  --   --    < > 4.2   < > 3.7   < > 3.3* 3.4* 3.8  CL 88*  --   --    < > 102   < > 100   < > 103 109 106  CO2 21*  --   --    < > 19*   < > 18*   < > 18* 18* 15*  BUN 19  --   --    < > 12   < > 8   < > 6* 5* <5*  CREATININE 1.04*  --   --    < > 0.73   < > 0.66   < > 0.51 0.52 0.60  CALCIUM 8.1*  --   --    < > 7.1*   < > 7.1*   < > 7.1* 6.5* 6.6*  PROT 5.8*  --   --   --  4.6*  --  4.5*  --   --   --   --   BILITOT 1.7*   < > 1.5*  --   1.3*  --  1.0  --   --   --   --   ALKPHOS 69  --   --   --  52  --  57  --   --   --   --   ALT 37  --   --   --  28  --  29  --   --   --   --   AST 33  --   --   --  24  --  31  --   --   --   --   GLUCOSE 195*  --   --    < > 150*   < > 161*   < > 144* 68* 201*   < > = values in this interval not displayed.    Imaging/Diagnostic Tests: No results  found.   Shirlean Mylar, MD 09/09/2019, 7:02 AM PGY-1, [facility] Family Medicine FPTS Intern pager: [phone], text pages welcome

## 2019-09-09 NOTE — TOC Progression Note (Signed)
Transition of Care [facility]) - Progression Note    Patient Details  Name: Tracey Riddle MRN: [id number] Date of Birth: Sep 03, 1950  Transition of [facility]) CM/SW Contact  Nadene Rubins Adria Devon, RN Phone Number: 09/09/2019, 11:22 AM  Clinical Narrative:     See prior note.   Wheel chair was delivered to hospital room.   Spoke with [name] and patient at bedside. PT recommending a hospital bed. [name] in agreement, she is contact person for delivery. Order placed and called Zack with [employer].  Expected Discharge Plan: Home w Home Health Services Barriers to Discharge: Continued Medical Work up  Expected Discharge Plan and Services Expected Discharge Plan: Home w Home Health Services   Discharge Planning Services: CM Consult Post Acute Care Choice: Home Health, Durable Medical Equipment Living arrangements for the past 2 months: Single Family Home                 DME Arranged: 3-N-1, Community education officer wheelchair with seat cushion DME Agency: [employer] Date DME Agency Contacted: 09/07/19 Time DME Agency Contacted: [phone] Representative spoke with at DME Agency: Zack HH Arranged: RN, PT, OT HH Agency: [facility] Date HH Agency Contacted: 09/07/19 Time HH Agency Contacted: 1545 Representative spoke with at [facility] Agency: Tiffany   Social Determinants of Health (SDOH) Interventions    Readmission Risk Interventions No flowsheet data found.

## 2019-09-09 NOTE — Anesthesia Postprocedure Evaluation (Signed)
Anesthesia Post Note  Patient: [place] Hanlon  Procedure(s) Performed: ESOPHAGOGASTRODUODENOSCOPY (EGD) WITH PROPOFOL (Left ) BIOPSY ESOPHAGEAL BRUSHING     Patient location during evaluation: Endoscopy Anesthesia Type: MAC Level of consciousness: awake and alert Pain management: pain level controlled Vital Signs Assessment: post-procedure vital signs reviewed and stable Respiratory status: spontaneous breathing, nonlabored ventilation, respiratory function stable and patient connected to nasal cannula oxygen Cardiovascular status: blood pressure returned to baseline and stable Postop Assessment: no apparent nausea or vomiting Anesthetic complications: no    Last Vitals:  Vitals:   09/08/19 1956 09/09/19 0428  BP: 126/68 130/74  Pulse: (!) 104 (!) 108  Resp: 18 18  Temp: 37 C 36.6 C  SpO2: 94% 93%    Last Pain:  Vitals:   09/09/19 0806  TempSrc:   PainSc: 0-No pain   Pain Goal: Patients Stated Pain Goal: 4 (09/04/19 1230)                 Azarya Oconnell L Rubylee Zamarripa

## 2019-09-09 NOTE — Progress Notes (Signed)
Patient ID: Tracey Riddle, female   DOB: [birth year], 69 y.o.   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  I recommend continuing fluconazole and ivermectin.  I asked her nurse to give a dose of ondansetron before her 6 PM dose of ivermectin.  We will collect a stool sample when she has a bowel movement to determine parasite burden.  PLAN: 1. Continue fluconazole and ivermectin 2. Taper prednisone 3. We will address and treat her hepatitis C when she is feeling better and able to return to our clinic  Principal Problem:   Intestinal strongyloidiasis Active Problems:   Chronic viral hepatitis C (HCC)   Esophageal candidiasis (HCC)   Hyponatremia   Immunosuppression due to drug therapy   Scheduled Meds: . aspirin EC  81 mg Oral Daily  . enoxaparin (LOVENOX) injection  40 mg Subcutaneous Q24H  . fluconazole  200 mg Oral Daily  . ivermectin  200 mcg/kg Oral QPM  . liver oil-zinc oxide  1 application Topical Daily  . methylPREDNISolone (SOLU-MEDROL) injection  24 mg Intravenous Daily  . [START ON 09/10/2019] pantoprazole  40 mg Intravenous Q12H  . sucralfate  1 g Oral TID AC & HS   Continuous Infusions: . sodium chloride 125 mL/hr at 09/09/19 0551  . pantoprozole (PROTONIX) infusion 8 mg/hr (09/09/19 0749)   PRN Meds:.ondansetron **OR** ondansetron (ZOFRAN) IV   SUBJECTIVE: Tracey Riddle is still having significant abdominal pain, distention nausea and constipation.  Review of Systems: Review of Systems  Constitutional: Negative for fever.  Gastrointestinal: Positive for abdominal pain, constipation, nausea and vomiting. Negative for diarrhea.    No Known Allergies  OBJECTIVE: Vitals:   09/08/19 1000 09/08/19 1500 09/08/19 1956 09/09/19 0428  BP:  111/61 118/65 126/68 130/74  Pulse: 98 (!) 104 (!) 104 (!) 108  Resp:  18 18 18   Temp:  98.7 F (37.1 C) 98.6 F (37 C) 97.9 F (36.6 C)  TempSrc:  Oral Oral Oral  SpO2: 96% 93% 94% 93%  Weight:      Height:       Body mass index is 33.94 kg/m.  Physical Exam Constitutional:      Comments: Her niece is at the bedside and interprets for her.  Tracey Riddle appears uncomfortable due to nausea and pain.  Cardiovascular:     Rate and Rhythm: Normal rate and regular rhythm.     Heart sounds: No murmur.  Pulmonary:     Effort: Pulmonary effort is normal.     Breath sounds: Normal breath sounds.  Abdominal:     General: There is distension.     Tenderness: There is abdominal tenderness. There is no guarding.     Lab Results Lab Results  Component Value Date   WBC 4.2 09/09/2019   HGB 9.1 (L) 09/09/2019   HCT 27.2 (L) 09/09/2019   MCV 97.5 09/09/2019   PLT 117 (L) 09/09/2019    Lab Results  Component Value Date   CREATININE 0.60 09/09/2019   BUN <5 (L) 09/09/2019   NA 128 (L) 09/09/2019   K 3.8 09/09/2019   CL 106 09/09/2019   CO2 15 (L) 09/09/2019    Lab Results  Component Value Date   ALT 29  09/06/2019   AST 31 09/06/2019   ALKPHOS 57 09/06/2019   BILITOT 1.0 09/06/2019     Microbiology: Recent Results (from the past 240 hour(s))  Culture, blood (routine x 2)     Status: None   Collection Time: 08/30/19 12:59 PM   Specimen: BLOOD LEFT HAND  Result Value Ref Range Status   Specimen Description BLOOD LEFT HAND  Final   Special Requests PEDIATRICS Blood Culture adequate volume  Final   Culture   Final    NO GROWTH 5 DAYS Performed at [facility], [place] [address], [place], [place] [address]    Report Status 09/04/2019 FINAL  Final  Culture, blood (routine x 2)     Status: None   Collection Time: 08/30/19 12:59 PM   Specimen: BLOOD  Result Value Ref Range Status   Specimen Description BLOOD LEFT ANTECUBITAL  Final   Special Requests   Final     BOTTLES DRAWN AEROBIC AND ANAEROBIC Blood Culture adequate volume   Culture   Final    NO GROWTH 5 DAYS Performed at [facility], [place] [address], [place], [place] [address]    Report Status 09/04/2019 FINAL  Final  SARS CORONAVIRUS 2 (TAT 6-24 HRS) Nasopharyngeal Nasopharyngeal Swab     Status: None   Collection Time: 09/04/19  4:46 AM   Specimen: Nasopharyngeal Swab  Result Value Ref Range Status   SARS Coronavirus 2 NEGATIVE NEGATIVE Final    Comment: (NOTE) SARS-CoV-2 target nucleic acids are NOT DETECTED. The SARS-CoV-2 RNA is generally detectable in upper and lower respiratory specimens during the acute phase of infection. Negative results do not preclude SARS-CoV-2 infection, do not rule out co-infections with other pathogens, and should not be used as the sole basis for treatment or other patient management decisions. Negative results must be combined with clinical observations, patient history, and epidemiological information. The expected result is Negative. Fact Sheet for Patients: SugarRoll.be Fact Sheet for Healthcare Providers: https://www.woods-mathews.com/ This test is not yet approved or cleared by the [place] FDA and  has been authorized for detection and/or diagnosis of SARS-CoV-2 by FDA under an Emergency Use Authorization (EUA). This EUA will remain  in effect (meaning this test can be used) for the duration of the COVID-19 declaration under Section 56 4(b)(1) of the Act, 21 U.S.C. section 360bbb-3(b)(1), unless the authorization is terminated or revoked sooner. Performed at [facility], [place] [address] [place], [place] [address]   Culture, blood (routine x 2)     Status: None   Collection Time: 09/04/19  5:49 AM   Specimen: BLOOD RIGHT FOREARM  Result Value Ref Range Status   Specimen Description BLOOD RIGHT FOREARM  Final   Special Requests   Final    BOTTLES DRAWN AEROBIC ONLY Blood Culture  results may not be optimal due to an inadequate volume of blood received in culture bottles   Culture   Final    NO GROWTH 5 DAYS Performed at [facility], [place] [address] [place], [place] [address]    Report Status 09/09/2019 FINAL  Final  Culture, blood (routine x 2)     Status: None   Collection Time: 09/04/19  7:58 AM   Specimen: BLOOD RIGHT HAND  Result Value Ref Range Status   Specimen Description BLOOD RIGHT HAND  Final   Special Requests   Final    BOTTLES DRAWN AEROBIC AND ANAEROBIC Blood Culture adequate volume   Culture   Final  NO GROWTH 5 DAYS Performed at [facility], [address] [place], [place] [address]    Report Status 09/09/2019 FINAL  Final  Culture, Urine     Status: Abnormal   Collection Time: 09/04/19  8:20 AM   Specimen: Urine, Random  Result Value Ref Range Status   Specimen Description URINE, RANDOM  Final   Special Requests   Final    SITE NOT SPECIFIED Performed at [facility], [address], [place], [place] [address]    Culture 60,000 COLONIES/mL YEAST (A)  Final   Report Status 09/05/2019 FINAL  Final    Cliffton Asters, MD [facility] [phone] pager   [phone] cell 09/09/2019, 11:37 AM

## 2019-09-09 NOTE — Care Management (Signed)
    Durable Medical Equipment  (From admission, onward)         Start     Ordered   09/09/19 1121  For home use only DME Hospital bed  Once    Comments: Call niece [name] [phone] for delivery  Question Answer Comment  Length of Need Lifetime   Patient has (list medical condition): Nausea, vomiting  Severe Duodenitis   The above medical condition requires: Patient requires the ability to reposition frequently   Head must be elevated greater than: 45 degrees   Bed type Semi-electric   Support Surface: Gel Overlay      09/09/19 1122   09/07/19 1553  For home use only DME lightweight manual wheelchair with seat cushion  Once    Comments: 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  A cane will not resolve  issue with performing activities of daily living. A wheelchair will allow patient to safely perform daily activities. Patient is not able to propel themselves in the home using a standard weight wheelchair due to Hypotonic Hyponatremia . Patient can self propel in the lightweight wheelchair. Length of need life time . Accessories: elevating leg rests (ELRs), wheel locks, extensions and anti-tippers.  Seat and back cushions.   09/07/19 1553   09/06/19 0824  For home use only DME 3 n 1  Once     09/06/19 6712

## 2019-09-09 NOTE — Progress Notes (Signed)
[facility] Gastroenterology Progress Note  Tracey Riddle 69 y.o. 1950/09/02   Subjective: The patient noted some nausea and vomiting overnight, but she has not had any episodes of emesis today. She tolerated beef broth 1 hour ago. She reports persistent abdominal discomfort and distention and states she has not had a bowel movement since she was admitted on 3/19.  Objective: Vital signs: Vitals:   09/09/19 0428 09/09/19 1312  BP: 130/74 122/68  Pulse: (!) 108 (!) 106  Resp: 18 17  Temp: 97.9 F (36.6 C) 98.6 F (37 C)  SpO2: 93% 95%    Physical Exam: Gen: lethargic, no acute distress  HEENT: anicteric sclera CV: RRR Chest: CTA B Abd:  Soft with mild diffuse tenderness and mild distention. Normoactive bowel sounds. No guarding. Ext: no edema  Lab Results: Recent Labs    09/07/19 0411 09/07/19 0411 09/08/19 0259 09/09/19 0422  NA 129*   < > 131* 128*  K 3.3*   < > 3.4* 3.8  CL 103   < > 109 106  CO2 18*   < > 18* 15*  GLUCOSE 144*   < > 68* 201*  BUN 6*   < > 5* <5*  CREATININE 0.51   < > 0.52 0.60  CALCIUM 7.1*   < > 6.5* 6.6*  MG 1.6*  --  1.8  --    < > = values in this interval not displayed.   No results for input(s): AST, ALT, ALKPHOS, BILITOT, PROT, ALBUMIN in the last 72 hours. Recent Labs    09/08/19 0259 09/09/19 0422  WBC 5.3 4.2  HGB 9.1* 9.1*  HCT 26.1* 27.2*  MCV 96.7 97.5  PLT 112* 117*    Assessment/Plan: Strongyloides infection. Continue Ivermectin treatment per ID recommendations. Monitor for symptom improvement during and after treatment. Continue antiemetics as needed. Will transition from continuous to 40 mg twice daily Protonix starting tomorrow. Supportive care. Will f/u.   [name] 09/09/2019, 3:34 PM  Questions please call [phone]

## 2019-09-10 LAB — BASIC METABOLIC PANEL
Anion gap: 8 (ref 5–15)
BUN: 6 mg/dL — ABNORMAL LOW (ref 8–23)
CO2: 16 mmol/L — ABNORMAL LOW (ref 22–32)
Calcium: 6.8 mg/dL — ABNORMAL LOW (ref 8.9–10.3)
Chloride: 105 mmol/L (ref 98–111)
Creatinine, Ser: 0.49 mg/dL (ref 0.44–1.00)
GFR calc Af Amer: >60 mL/min (ref >60)
GFR calc non Af Amer: >60 mL/min (ref >60)
Glucose, Bld: 183 mg/dL — ABNORMAL HIGH (ref 70–99)
Potassium: 3.5 mmol/L (ref 3.5–5.1)
Sodium: 129 mmol/L — ABNORMAL LOW (ref 135–145)

## 2019-09-10 LAB — CBC
HCT: 27.5 % — ABNORMAL LOW (ref 36.0–46.0)
Hemoglobin: 9.3 g/dL — ABNORMAL LOW (ref 12.0–15.0)
MCH: 33.1 pg (ref 26.0–34.0)
MCHC: 33.8 g/dL (ref 30.0–36.0)
MCV: 97.9 fL (ref 80.0–100.0)
Platelets: 101 10*3/uL — ABNORMAL LOW (ref 150–400)
RBC: 2.81 MIL/uL — ABNORMAL LOW (ref 3.87–5.11)
RDW: 13.3 % (ref 11.5–15.5)
WBC: 3.9 10*3/uL — ABNORMAL LOW (ref 4.0–10.5)
nRBC: 2.8 % — ABNORMAL HIGH (ref 0.0–0.2)

## 2019-09-10 LAB — GLUCOSE, CAPILLARY
Glucose-Capillary: 158 mg/dL — ABNORMAL HIGH (ref 70–99)
Glucose-Capillary: 281 mg/dL — ABNORMAL HIGH (ref 70–99)
Glucose-Capillary: 206 mg/dL — ABNORMAL HIGH (ref 70–99)

## 2019-09-10 MED ORDER — METHYLPREDNISOLONE SODIUM SUCC 40 MG IJ SOLR: 5.0000 mg | Freq: Every day | INTRAMUSCULAR | Status: DC

## 2019-09-10 MED ORDER — ATORVASTATIN CALCIUM 40 MG PO TABS
40.0000 mg | ORAL_TABLET | Freq: Every day | ORAL | Status: DC
  Administered 2019-09-10 – 2019-09-15 (×6): 40 mg via ORAL
  Filled 2019-09-10 (×6): qty 1

## 2019-09-10 MED ORDER — METHYLPREDNISOLONE SODIUM SUCC 40 MG IJ SOLR: 10.0000 mg | Freq: Every day | INTRAMUSCULAR | Status: DC

## 2019-09-10 MED ORDER — GUAIFENESIN 100 MG/5ML PO SOLN
5.0000 mL | ORAL | Status: DC | PRN
  Administered 2019-09-10: 19:00:00 100 mg via ORAL
  Filled 2019-09-10 (×2): qty 5

## 2019-09-10 MED ORDER — METHYLPREDNISOLONE SODIUM SUCC 40 MG IJ SOLR
20.0000 mg | Freq: Every day | INTRAMUSCULAR | Status: AC
  Administered 2019-09-10 – 2019-09-12 (×3): 20 mg via INTRAVENOUS
  Filled 2019-09-10 (×3): qty 1

## 2019-09-10 MED ORDER — METHYLPREDNISOLONE SODIUM SUCC 40 MG IJ SOLR: 15.0000 mg | Freq: Every day | INTRAMUSCULAR | Status: DC

## 2019-09-10 NOTE — Progress Notes (Signed)
[facility] Gastroenterology Progress Note  Tracey Riddle 69 y.o. [birth year]   Subjective: Feels a little better. Denies vomiting or abdominal pain. BM this morning.  Objective: Vital signs: Vitals:   09/09/19 2009 09/10/19 0316  BP: 131/66 127/71  Pulse: (!) 102 99  Resp: 18 17  Temp: 98 F (36.7 C) 99.2 F (37.3 C)  SpO2: 96% 93%    Physical Exam: Gen: lethargic, elderly, well-nourished, no acute distress  HEENT: anicteric sclera CV: RRR Chest: CTA B Abd: diffuse tenderness with guarding, soft, nondistended, +BS Ext: trace pitting LE edema  Lab Results: Recent Labs    09/08/19 0259 09/08/19 0259 09/09/19 0422 09/10/19 0247  NA 131*   < > 128* 129*  K 3.4*   < > 3.8 3.5  CL 109   < > 106 105  CO2 18*   < > 15* 16*  GLUCOSE 68*   < > 201* 183*  BUN 5*   < > <5* 6*  CREATININE 0.52   < > 0.60 0.49  CALCIUM 6.5*   < > 6.6* 6.8*  MG 1.8  --   --   --    < > = values in this interval not displayed.   No results for input(s): AST, ALT, ALKPHOS, BILITOT, PROT, ALBUMIN in the last 72 hours. Recent Labs    09/09/19 0422 09/10/19 0247  WBC 4.2 3.9*  HGB 9.1* 9.3*  HCT 27.2* 27.5*  MCV 97.5 97.9  PLT 117* 101*      Assessment/Plan: Intestinal Strongyloidiasis reactivated by recent steroid therapy. On Ivermectin followed by ID. Also on Fluconazole for Esophageal Candidiasis. Treatment duration per ID. Ok to change to full liquid diet and advance as tolerated. Supportive care. No plans for repeat EGD or doing a colonoscopy unless symptoms warrant despite treatment of her parasitic infection. Will sign off. Call if questions.    Tracey Riddle 09/10/2019, 10:36 AM  Questions please call [phone] ID: Tracey Riddle, female   DOB: 01/15/1951, 69 y.o.   MRN: [id number]

## 2019-09-10 NOTE — Progress Notes (Signed)
OT Cancellation Note  Patient Details Name: Tracey Riddle MRN: [id number] DOB: 1951-05-19   Cancelled Treatment:    Reason Eval/Treat Not Completed: Fatigue/lethargy limiting ability to participate;Pain limiting ability to participate.  Pt fatigued - Rn reports pt just back to bed after sitting up for over an hour.  Attempted to engage pt in UE exercise, however, she is c/o pain at Lt wrist IV site so deferred exercise - RN notified.  [name]., OTR/L Acute Rehabilitation Services Pager [phone] Office [phone]   [name] M 09/10/2019, 2:51 PM

## 2019-09-10 NOTE — Care Management Important Message (Signed)
Important Message  Patient Details  Name: Tracey Riddle MRN: [id number] Date of Birth: Dec 22, 1950   Medicare Important Message Given:        Mardene Sayer 09/10/2019, 4:23 PM    IM given to family to give to patient. Due to  Contact Precaution

## 2019-09-10 NOTE — Progress Notes (Signed)
PT Cancellation Note  Patient Details Name: Tracey Riddle MRN: [id number] DOB: [birth year]   Cancelled Treatment:    Reason Eval/Treat Not Completed: Patient declined, no reason specified. Pt very lethargic and fatigued after returning back to bed with RN staff. No family/caregivers present and pt requesting PT to return tomorrow. PT will continue to f/u with pt acutely as available.    Alessandra Bevels Marieliz Strang 09/10/2019, 4:39 PM

## 2019-09-10 NOTE — Progress Notes (Signed)
Family Medicine Teaching Service Daily Progress Note Intern Pager: [phone]  Patient name: Tracey Riddle Medical record number: [id number] Date of birth: 11-24-1950 Age: 69 y.o. Gender: female  Primary Care Provider: System, Pcp Not In Consultants: GI Code Status: full  Pt Overview and Major Events to Date:  3/19- admitted 3/22- EGD 3/23- Strongyloides on surgical pathology  Assessment and Plan: Tracey Riddle is a 69 y.o. female presenting with intractable abdominal pain and nausea. 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 Consulted infectious disease, who recommended treating with ivermectin. Strongyloidiasis was likely triggered by recent prednisone therapy, and also explains the bacteremia found at her last admission. Strongyloides larvae are strongly associated with polymicrobial GNR bacteremia as the larvae transit the GI system into systemic circulation. GI recommends starting IV protonix BID today. Discussed this case with patient's neurologist, Dr. Shearon Stalls at [facility], will continue taper of prednisone,see myositis problem below for plan. Overnight, patient improved with no episodes of emesis. Last emesis yesterday morning, hopefully turning the corner and can start more PO medications and advance diet as tolerated. -GI following appreciate recommendations.  -ID consulted, appreciate recommendations -Protonix 40mg  IV BID -CLD, ADAT to FLD -Ivermectin day 3/14 -Fluconazole 200 mg PO daily, day 4/21  -Methylprednisolone 24mg  IV daily  Hepatitis C RUQ VH:QIONGEXBM liver with lobulated surface, no mass or ascites. Negative gallbladder.  AST, ALT wnl. Total bili 1.7>1.5>1.3>1.0, direct= 0.3, indirect 1.2. Quant Hep C: 4.5 million. -f/u hepC genotype 4 -ID consulted, will treat in the outpatient  setting -Will give Hepatitis B vaccines prior to discharge  Bacteremia- resolved Patient completed treatment for Pseudomonas and E. coli bacteremia 3/21.  Blood cultures on this admission no growth at 4 days. Urine culture positive for 60K CFU yeast. Treating esophageal candidiasis with fluconazole, see above. - levaquin (3/15-3/18) - cefepime (3/19-3/21)  Hypotonic Hyponatremia Na 123>130>125>129>>131>129. Patient has strongyloides with nearly 2 weeks of emesis and diarrhea. Likely cause of hypotonic hyponatremia. CLD, will try to advance diet to FLD and decrease IVF to 75 mL/hr. Will continue to monitor. Previous urine studies indicative of hypotonic hyponatremia. 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 No aspart yesterday, methylprednisolone 24mg  daily.  A1c 8%. Home medications include lispro 5-10U before meals. -CBG BID  -sSSI - goal CBGs at the moment <180  H/o RA  Myositis Discussed case with Dr. Shearon Stalls of [facility] today. Patient had muscle biopsy that was nonspecific, greatest concern for crush injury. Dr. Shearon Stalls recommends taper. Plan today to decrease steroids to oral prednisone equivalent of 20mg  x3d, 15mg  x3d, 10mg  x3d, 5mg  x3 days and then discontinue. -Patient to follow up with Dr. Shearon Stalls and [facility]  H/O TIA Lipid panel wnl except HDL 32. Home medication lipitor 40 mg, ASA 81mg . -Restart lipitor home medication -Continue ASA 81 mg  Constipation Patient has not had a bowel movement, but has not had significant intake due to nausea and emesis. Will add in miralax as patient tolerates more solid food, but no indication to start yet.  Normocytic Anemia Hgb 9.3, stable from yesterday, was 12.9 on admission, has been receiving IV fluids, could have slight dilutional component.  MCV 96.1.  Was around 11 on previous admission. - monitor CBC - consider outpatient colonoscopy  FEN/GI: CLD, ADAT Prophylaxis:  lovenox  Disposition: pending continued treatment of infections, eventually home with  HH - orders in  Subjective:  Patient reports last emesis yesterday morning. She slept well overnight and is starting to feel a little better.  Objective: Temp:  [98 F (36.7 C)-99.2 F (37.3 C)] 99.2 F (37.3 C) (03/25 0316) Pulse Rate:  [99-106] 99 (03/25 0316) Resp:  [17-18] 17 (03/25 0316) BP: (122-131)/(66-71) 127/71 (03/25 0316) SpO2:  [93 %-96 %] 93 % (03/25 0316)  Physical Exam:  General: older African woman resting comfortably in bed, in NAD Cardio: RRR no m/r/g Lungs: CTAB, no wheezing, no rhonchi, no crackles, no IWOB on RA Abdomen: Soft, mildly tender in epigastric region, ND, normal bowel sounds present Skin: erythema along inguinal crease R>L Extremities: No edema   Laboratory: Recent Labs  Lab 09/08/19 0259 09/09/19 0422 09/10/19 0247  WBC 5.3 4.2 3.9*  HGB 9.1* 9.1* 9.3*  HCT 26.1* 27.2* 27.5*  PLT 112* 117* 101*   Recent Labs  Lab 09/04/19 0026 09/04/19 0026 09/04/19 0759 09/04/19 1700 09/05/19 0328 09/05/19 0328 09/06/19 0751 09/06/19 1413 09/08/19 0259 09/09/19 0422 09/10/19 0247  NA 123*  --   --    < > 130*   < > 125*   < > 131* 128* 129*  K 4.5  --   --    < > 4.2   < > 3.7   < > 3.4* 3.8 3.5  CL 88*  --   --    < > 102   < > 100   < > 109 106 105  CO2 21*  --   --    < > 19*   < > 18*   < > 18* 15* 16*  BUN 19  --   --    < > 12   < > 8   < > 5* <5* 6*  CREATININE 1.04*  --   --    < > 0.73   < > 0.66   < > 0.52 0.60 0.49  CALCIUM 8.1*  --   --    < > 7.1*   < > 7.1*   < > 6.5* 6.6* 6.8*  PROT 5.8*  --   --   --  4.6*  --  4.5*  --   --   --   --   BILITOT 1.7*   < > 1.5*  --  1.3*  --  1.0  --   --   --   --   ALKPHOS 69  --   --   --  52  --  57  --   --   --   --   ALT 37  --   --   --  28  --  29  --   --   --   --   AST 33  --   --   --  24  --  31  --   --   --   --   GLUCOSE 195*  --   --    < > 150*   < > 161*   < > 68* 201* 183*   < > =  values in this interval not displayed.    Imaging/Diagnostic Tests: No results found.   Shirlean Mylar, MD 09/10/2019, 9:25 AM PGY-1, [facility] FPTS Intern pager: [phone], text pages welcome

## 2019-09-10 NOTE — Progress Notes (Signed)
Patient ID: Tracey Riddle, female   DOB: October 11, 1950, 69 y.o.   MRN: [id number]         [facility]  Date of Admission:  09/03/2019           Day 4 fluconazole        Day 2 ivermectin ASSESSMENT: She is improving very slowly on therapy for candidal esophagitis and intestinal strongyloidiasis.  PLAN: 1. Continue current therapy  Principal Problem:   Intestinal strongyloidiasis Active Problems:   Chronic viral hepatitis C (HCC)   Esophageal candidiasis (HCC)   Hyponatremia   Immunosuppression due to drug therapy   Scheduled Meds: . aspirin EC  81 mg Oral Daily  . atorvastatin  40 mg Oral q1800  . enoxaparin (LOVENOX) injection  40 mg Subcutaneous Q24H  . fluconazole  200 mg Oral Daily  . ivermectin  200 mcg/kg Oral QPM  . liver oil-zinc oxide  1 application Topical Daily  . methylPREDNISolone (SOLU-MEDROL) injection  20 mg Intravenous Daily   Followed by  . [START ON 09/13/2019] methylPREDNISolone (SOLU-MEDROL) injection  15.2 mg Intravenous Daily   Followed by  . [START ON 09/16/2019] methylPREDNISolone (SOLU-MEDROL) injection  10 mg Intravenous Daily   Followed by  . [START ON 09/19/2019] methylPREDNISolone (SOLU-MEDROL) injection  5.2 mg Intravenous Daily  . pantoprazole  40 mg Intravenous Q12H   Continuous Infusions: . sodium chloride 75 mL/hr at 09/10/19 1107   PRN Meds:.acetaminophen, guaiFENesin, ondansetron **OR** ondansetron (ZOFRAN) IV   SUBJECTIVE: Her niece says that Tracey Riddle is doing just a little bit better today.  She had a small bowel movement last night.  She has not had as much problems with nausea or vomiting today.  She has been able to take some liquids.  She is still having some odynophagia.  Review of Systems: Review of Systems  Unable to perform ROS: Language    No Known Allergies  OBJECTIVE: Vitals:   09/09/19 1312 09/09/19 2009 09/10/19 0316 09/10/19 1414  BP: 122/68 131/66 127/71 125/71  Pulse: (!) 106 (!)  102 99 95  Resp: 17 18 17 18   Temp: 98.6 F (37 C) 98 F (36.7 C) 99.2 F (37.3 C) 97.9 F (36.6 C)  TempSrc: Oral Oral Oral Oral  SpO2: 95% 96% 93% 98%  Weight:      Height:       Body mass index is 33.94 kg/m.  Physical Exam Constitutional:      Comments: She was working with her nurses to transfer from the bed to the chair.     Lab Results Lab Results  Component Value Date   WBC 3.9 (L) 09/10/2019   HGB 9.3 (L) 09/10/2019   HCT 27.5 (L) 09/10/2019   MCV 97.9 09/10/2019   PLT 101 (L) 09/10/2019    Lab Results  Component Value Date   CREATININE 0.49 09/10/2019   BUN 6 (L) 09/10/2019   NA 129 (L) 09/10/2019   K 3.5 09/10/2019   CL 105 09/10/2019   CO2 16 (L) 09/10/2019    Lab Results  Component Value Date   ALT 29 09/06/2019   AST 31 09/06/2019   ALKPHOS 57 09/06/2019   BILITOT 1.0 09/06/2019     Microbiology: Recent Results (from the past 240 hour(s))  SARS CORONAVIRUS 2 (TAT 6-24 HRS) Nasopharyngeal Nasopharyngeal Swab     Status: None   Collection Time: 09/04/19  4:46 AM   Specimen: Nasopharyngeal Swab  Result Value Ref Range Status   SARS Coronavirus  2 NEGATIVE NEGATIVE Final    Comment: (NOTE) SARS-CoV-2 target nucleic acids are NOT DETECTED. The SARS-CoV-2 RNA is generally detectable in upper and lower respiratory specimens during the acute phase of infection. Negative results do not preclude SARS-CoV-2 infection, do not rule out co-infections with other pathogens, and should not be used as the sole basis for treatment or other patient management decisions. Negative results must be combined with clinical observations, patient history, and epidemiological information. The expected result is Negative. Fact Sheet for Patients: SugarRoll.be Fact Sheet for Healthcare Providers: https://www.woods-mathews.com/ This test is not yet approved or cleared by the [place] FDA and  has been authorized for  detection and/or diagnosis of SARS-CoV-2 by FDA under an Emergency Use Authorization (EUA). This EUA will remain  in effect (meaning this test can be used) for the duration of the COVID-19 declaration under Section 56 4(b)(1) of the Act, 21 U.S.C. section 360bbb-3(b)(1), unless the authorization is terminated or revoked sooner. Performed at [facility], [place] [address] [place], [place] [address]   Culture, blood (routine x 2)     Status: None   Collection Time: 09/04/19  5:49 AM   Specimen: BLOOD RIGHT FOREARM  Result Value Ref Range Status   Specimen Description BLOOD RIGHT FOREARM  Final   Special Requests   Final    BOTTLES DRAWN AEROBIC ONLY Blood Culture results may not be optimal due to an inadequate volume of blood received in culture bottles   Culture   Final    NO GROWTH 5 DAYS Performed at [facility], [place] [address], [place], [place] [address]    Report Status 09/09/2019 FINAL  Final  Culture, blood (routine x 2)     Status: None   Collection Time: 09/04/19  7:58 AM   Specimen: BLOOD RIGHT HAND  Result Value Ref Range Status   Specimen Description BLOOD RIGHT HAND  Final   Special Requests   Final    BOTTLES DRAWN AEROBIC AND ANAEROBIC Blood Culture adequate volume   Culture   Final    NO GROWTH 5 DAYS Performed at [facility], [place] [address] [place], [place] [address]    Report Status 09/09/2019 FINAL  Final  Culture, Urine     Status: Abnormal   Collection Time: 09/04/19  8:20 AM   Specimen: Urine, Random  Result Value Ref Range Status   Specimen Description URINE, RANDOM  Final   Special Requests   Final    SITE NOT SPECIFIED Performed at [facility], [place] [address], [place], [place] [address]    Culture 60,000 COLONIES/mL YEAST (A)  Final   Report Status 09/05/2019 FINAL  Final    Michel Bickers, MD [place] [facility] [phone] pager   [phone] cell 09/10/2019, 3:35 PM

## 2019-09-10 NOTE — Plan of Care (Signed)
  Problem: Education: Goal: Knowledge of General Education information will improve Description Including pain rating scale, medication(s)/side effects and non-pharmacologic comfort measures Outcome: Progressing   

## 2019-09-11 LAB — CBC
HCT: 28.1 % — ABNORMAL LOW (ref 36.0–46.0)
Hemoglobin: 9.6 g/dL — ABNORMAL LOW (ref 12.0–15.0)
MCH: 32.8 pg (ref 26.0–34.0)
MCHC: 34.2 g/dL (ref 30.0–36.0)
MCV: 95.9 fL (ref 80.0–100.0)
Platelets: 119 10*3/uL — ABNORMAL LOW (ref 150–400)
RBC: 2.93 MIL/uL — ABNORMAL LOW (ref 3.87–5.11)
RDW: 13.4 % (ref 11.5–15.5)
WBC: 5.8 10*3/uL (ref 4.0–10.5)
nRBC: 2.8 % — ABNORMAL HIGH (ref 0.0–0.2)

## 2019-09-11 LAB — BASIC METABOLIC PANEL
Anion gap: 5 (ref 5–15)
BUN: 7 mg/dL — ABNORMAL LOW (ref 8–23)
CO2: 18 mmol/L — ABNORMAL LOW (ref 22–32)
Calcium: 6.8 mg/dL — ABNORMAL LOW (ref 8.9–10.3)
Chloride: 105 mmol/L (ref 98–111)
Creatinine, Ser: 0.49 mg/dL (ref 0.44–1.00)
GFR calc Af Amer: >60 mL/min (ref >60)
GFR calc non Af Amer: >60 mL/min (ref >60)
Glucose, Bld: 183 mg/dL — ABNORMAL HIGH (ref 70–99)
Potassium: 3.7 mmol/L (ref 3.5–5.1)
Sodium: 128 mmol/L — ABNORMAL LOW (ref 135–145)

## 2019-09-11 LAB — OVA + PARASITE EXAM

## 2019-09-11 LAB — GLUCOSE, CAPILLARY
Glucose-Capillary: 165 mg/dL — ABNORMAL HIGH (ref 70–99)
Glucose-Capillary: 268 mg/dL — ABNORMAL HIGH (ref 70–99)

## 2019-09-11 LAB — O&P RESULT

## 2019-09-11 MED ORDER — ADULT MULTIVITAMIN W/MINERALS CH
1.0000 | ORAL_TABLET | Freq: Every day | ORAL | Status: DC
  Administered 2019-09-11 – 2019-09-15 (×5): 1 via ORAL
  Filled 2019-09-11 (×5): qty 1

## 2019-09-11 MED ORDER — PRO-STAT SUGAR FREE PO LIQD
30.0000 mL | Freq: Three times a day (TID) | ORAL | Status: DC
  Administered 2019-09-11 – 2019-09-15 (×14): 30 mL via ORAL
  Filled 2019-09-11 (×14): qty 30

## 2019-09-11 MED ORDER — ACETAMINOPHEN 325 MG PO TABS
650.0000 mg | ORAL_TABLET | Freq: Four times a day (QID) | ORAL | Status: DC
  Administered 2019-09-11 – 2019-09-15 (×15): 650 mg via ORAL
  Filled 2019-09-11 (×16): qty 2

## 2019-09-11 MED ORDER — POLYETHYLENE GLYCOL 3350 17 G PO PACK
17.0000 g | PACK | Freq: Every day | ORAL | Status: DC | PRN
  Administered 2019-09-11 – 2019-09-12 (×2): 17 g via ORAL
  Filled 2019-09-11 (×2): qty 1

## 2019-09-11 MED ORDER — ONDANSETRON HCL 4 MG PO TABS
4.0000 mg | ORAL_TABLET | Freq: Three times a day (TID) | ORAL | Status: DC
  Administered 2019-09-11 – 2019-09-14 (×9): 4 mg via ORAL
  Filled 2019-09-11 (×9): qty 1

## 2019-09-11 NOTE — Progress Notes (Signed)
Occupational Therapy Treatment Patient Details Name: Tracey Riddle MRN: [id number] DOB: 12-[name]-1951 Today's Date: 09/11/2019    History of present illness Pt is a 69 y/o female admitted secondary to worsening abdominal pain. Found to have hyponatremia. Pt with recent admission for sepsis from viral gastroenteritis. S/P EGD 3/23.PMH includes CKD, DM, and TIA.    OT comments  Pt. Seen for skilled therapy session with PT.  Session initiated with gestural and hand over hand demonstration for communication and instuction. ( it was noted from previous providers pts. Specific dialect/language was not available on the tele interpreter service).  Min a x2 for bed mobility with pt. Nodding and able to follow cues without noted issue.  MD arrived as pt. Was sitting eob and provided instruction for use of tele interpreter with utilization of service representative to locate pts spoken language. Interpreter utilized for remainder of session.   Mod a x2 for sit/stand with pivotal steps to recliner.  Will continue progression of mobility and adls as pt. Able.    Interpreter: Inocente Salles [phone]         Follow Up Recommendations  Home health OT;Supervision/Assistance - 24 hour;Other (comment)    Equipment Recommendations  3 in 1 bedside commode;Wheelchair (measurements OT);Wheelchair cushion (measurements OT)    Recommendations for Other Services      Precautions / Restrictions Precautions Precautions: Fall       Mobility Bed Mobility Overal bed mobility: Needs Assistance Bed Mobility: Rolling;Sidelying to Sit Rolling: Min assist Sidelying to sit: Min guard       General bed mobility comments: gestural cues and hand over hand assistance. pt.able to reach with L UE for rail.  mod a to initiate bringing b les off of the bed, min/mod a to transition into sitting.  Transfers Overall transfer level: Needs assistance Equipment used: Rolling walker (2 wheeled) Transfers: Sit to/from [employer] Sit to Stand: Min assist;+2 physical assistance Stand pivot transfers: Min assist;+2 physical assistance       General transfer comment: pt requires cueing for hand placement and sequencing; min asisst +2 or mod assist +1 to power up and steady from EOB    Balance                                           ADL either performed or assessed with clinical judgement   ADL Overall ADL's : Needs assistance/impaired                         Toilet Transfer: Minimal assistance;+2 for physical assistance Toilet Transfer Details (indicate cue type and reason): on bed pan upon arrival assisted pt. off of rolling R with gestural cues Toileting- Clothing Manipulation and Hygiene: Moderate assistance;Bed level       Functional mobility during ADLs: Minimal assistance;+2 for physical assistance;+2 for safety/equipment;Rolling walker General ADL Comments: pt limited by generalized weakness and decreased activity tolerance for safe and independent BADLs     Vision       Perception     Praxis      Cognition Arousal/Alertness: Awake/alert Behavior During Therapy: Flat affect Overall Cognitive Status: Difficult to assess  Exercises     Shoulder Instructions       General Comments      Pertinent Vitals/ Pain       Pain Assessment: Faces Faces Pain Scale: Hurts little more Pain Location: upper legs/thighs Pain Descriptors / Indicators: Discomfort  Home Living                                          Prior Functioning/Environment              Frequency  Min 3X/week        Progress Toward Goals  OT Goals(current goals can now be found in the care plan section)  Progress towards OT goals: Progressing toward goals     Plan Discharge plan remains appropriate;Frequency remains appropriate    Co-evaluation      Reason for Co-Treatment: To address  functional/ADL transfers;For patient/therapist safety   OT goals addressed during session: ADL's and self-care      AM-PAC OT "6 Clicks" Daily Activity     Outcome Measure   Help from another person eating meals?: None Help from another person taking care of personal grooming?: A Little Help from another person toileting, which includes using toliet, bedpan, or urinal?: A Lot Help from another person bathing (including washing, rinsing, drying)?: A Lot Help from another person to put on and taking off regular upper body clothing?: A Little Help from another person to put on and taking off regular lower body clothing?: A Lot 6 Click Score: 16    End of Session Equipment Utilized During Treatment: Rolling walker  OT Visit Diagnosis: Unsteadiness on feet (R26.81);Other abnormalities of gait and mobility (R26.89);Muscle weakness (generalized) (M62.81)   Activity Tolerance Patient tolerated treatment well   Patient Left in chair;with call bell/phone within reach   Nurse Communication Other (comment)(CNA: how to use ipad interpreter to locate her language, request for purewik, request for frequent checks due to language barrier, tips for stand pivot back to bed)        Time: 5885-0277 OT Time Calculation (min): 23 min  Charges: OT General Charges $OT Visit: 1 Visit OT Treatments $Self Care/Home Management : 8-22 mins  Boneta Lucks, COTA/L Acute Rehabilitation [phone]   Robet Leu 09/11/2019, 1:16 PM

## 2019-09-11 NOTE — Progress Notes (Signed)
Initial Nutrition Assessment  DOCUMENTATION CODES:   Obesity unspecified  INTERVENTION:   -Advance diet to soft for increased food selections -30 ml Prostat TID, each supplement provides 100 kcals and 15 grams protein -MVI with minerals daily -Allow family to bring in preferred foods to encourage oral intake  NUTRITION DIAGNOSIS:   Inadequate oral intake related to altered GI function as evidenced by meal completion < 25%.  GOAL:   Patient will meet greater than or equal to 90% of their needs  MONITOR:   Supplement acceptance, PO intake, Labs, Weight trends, Skin, I & O's  REASON FOR ASSESSMENT:   Consult Assessment of nutrition requirement/status, Calorie Count  ASSESSMENT:   Tracey Riddle is a 69 y.o. female presenting with abdominal pain and nausea. 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 Case discussed with RN; nurse tech tried to use stratus interpreter just prior to RD visit, however, unsuccessful, as interpreter was unable to hear pt. Pt was very soft spoken and often fell asleep between questions. No family at bedside to provide further history.   Pt with very poor oral intake; noted meal completion 0-25% on a full liquid diet. RD and RD assisted pt in feeding breakfast; she consumed 100% of grits when fed and given encouragement. Pt also consumed water, medications, and Prostat supplement. RD examined pt mouth- swallowed food and medications completely.   Pt complained of stomach pain, however, resolved after visit. Suspect pt will eat  more with encouragement and more familiar foods.   Case discussed with MD, who agreed to advance diet to soft as well as allow family to bring in outside foods that were more palatable to her.   Per review of wt records, no wt loss since admission.   Pt appeared to be feeling better after RD visit; she said "thank you" and smiled at this RD when RD exited the room at the end of assessment.   Medications reviewed and include sodium chloride infusion @ 50 ml/hr.   Labs reviewed: Na: 128, CBGS: 165-206.    NUTRITION - FOCUSED PHYSICAL EXAM:    Most Recent Value  Orbital Region  No depletion  Upper Arm Region  No depletion  Thoracic and Lumbar Region  No depletion  Buccal Region  No depletion  Temple Region  No depletion  Clavicle Bone Region  No depletion  Clavicle and Acromion Bone Region  No depletion  Scapular Bone Region  No depletion  Dorsal Hand  No depletion  Patellar Region  No depletion  Anterior Thigh Region  No depletion  Posterior Calf Region  No depletion  Edema (RD Assessment)  Mild  Hair  Reviewed  Eyes  Reviewed  Mouth  Reviewed  Skin  Reviewed  Nails  Reviewed       Diet Order:   Diet Order            DIET SOFT Room service appropriate? Yes; Fluid consistency: Thin  Diet effective now              EDUCATION NEEDS:   No education needs have been identified  at this time  Skin:  Skin Assessment: Reviewed RN Assessment  Last BM:  09/10/19  Height:   Ht Readings from Last 1 Encounters:  09/07/19 5\' 3"  (1.6 m)    Weight:   Wt Readings from Last 1 Encounters:  09/07/19 86.9 kg    Ideal Body Weight:  52.3 kg  BMI:  Body mass index is 33.94 kg/m.  Estimated Nutritional Needs:   Kcal:  1800-2000  Protein:  90-105 grams  Fluid:  > 1.8 L    [name], RD, LDN, [place] Registered Dietitian II Certified Diabetes Care and Education Specialist Please refer to [facility] for RD and/or RD on-call/weekend/after hours pager

## 2019-09-11 NOTE — Progress Notes (Signed)
Family Medicine Teaching Service Daily Progress Note Intern Pager: [phone]  Patient name: Tracey Riddle Medical record number: [id number] Date of birth: 11/23/1950 Age: 69 y.o. Gender: female  Primary Care Provider: System, Pcp Not In Consultants: GI Code Status: full  Pt Overview and Major Events to Date:  3/19- admitted 3/22- EGD 3/23- Strongyloides on surgical pathology  Assessment and Plan: Tracey Riddle is a 68 y.o. female presenting with intractable abdominal pain and nausea. 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 Surgical pathology cultured positive for strongyloides stercoralis. Continuing treatment with fluconazole and ivermectin per ID. GI recommends continuing IV protonix BID. Patient is passing gas, but has not had a BM in several days. Now that she no longer has emesis, will give miralax. Discussed this case with patient's neurologist, Dr. Celine Mans at [facility], will continue taper of prednisone,see myositis problem below for plan. Progress is slow, but steady.  -GI following appreciate recommendations.  -ID consulted, appreciate recommendations -Protonix 40mg  IV BID -FLD, ADAT to soft diet -Ivermectin day 4/14 -Fluconazole 200 mg PO daily, day 5/21  -Miralax  Hypotonic Hyponatremia Na 123>>131>128. Patient has strongyloides with nearly 2 weeks of emesis and diarrhea. Likely cause of hypotonic hyponatremia. Tolerated FLD well yesterday, will try to advance diet to soft today and decrease IVF to 50 mL/hr. Will continue to monitor. Previous urine studies indicative of hypotonic hyponatremia. - NS 60mL/hr - strict I/Os - BMP daily  Hepatitis C RUQ 45m liver with lobulated surface, no mass or ascites. Negative gallbladder.  AST, ALT wnl. Total bili 1.7>1.5>1.3>1.0,  direct= 0.3, indirect 1.2. Quant Hep C: 4.5 million. -f/u hepC genotype 4 -ID consulted, will treat in the outpatient setting -Will give Hepatitis B vaccines prior to discharge  Bacteremia- resolved Patient completed treatment for Pseudomonas and E. coli bacteremia 3/21.  Blood cultures on this admission no growth at 4 days. Urine culture positive for 60K CFU yeast. Treating esophageal candidiasis with fluconazole, see above. - levaquin (3/15-3/18) - cefepime (3/19-3/21)  DMT2 CBGs ranged from 201-248-183 overnight. No aspart yesterday, methylprednisolone 24mg  daily.  A1c 8%. Home medications include lispro 5-10U before meals. -CBG BID  -sSSI - goal CBGs at the moment <180  H/o RA  Myositis Discussed case with Dr. 10-23-1969 of [facility] today. Patient had muscle biopsy that was nonspecific, greatest concern for crush injury. Dr. Celine Mans recommends taper. Plan today to decrease steroids to oral prednisone equivalent of 20mg  x3d, 15mg  x3d, 10mg  x3d, 5mg  x3 days and then discontinue. -Patient to follow up with [facility] and [facility] -Prednisone taper per pharmacy  H/O TIA Lipid panel wnl except HDL 32. Home medication lipitor 40 mg, ASA 81mg . -Restart lipitor home medication -Continue ASA 81 mg  Constipation Patient has not had a bowel movement, but has not had significant intake due to nausea and emesis. Will add in miralax as patient tolerates more solid food, but no indication to start yet.  Normocytic Anemia Hgb 9.3, stable from yesterday, was 12.9 on admission, has been receiving IV fluids, could have slight dilutional component.  MCV 96.1.  Was around 11 on previous admission. - monitor CBC - consider outpatient colonoscopy  FEN/GI: CLD, ADAT Prophylaxis: lovenox  Disposition: pending continued treatment of infections, eventually home with [facility] - orders in  Subjective:  Patient has not had emesis in 24 hours,  feeling better. Has not yet had bowel movement,  will add miralax.  Objective: Temp:  [97.9 F (36.6 C)-99 F (37.2 C)] 98.2 F (36.8 C) (03/26 0530) Pulse Rate:  [91-97] 91 (03/26 0530) Resp:  [18] 18 (03/26 0530) BP: (119-131)/(71-81) 119/73 (03/26 0530) SpO2:  [93 %-98 %] 93 % (03/26 0530)  Physical Exam:  General: older African woman resting comfortably in bed, in NAD Cardio: RRR no m/r/g Lungs: CTAB, no wheezing, no rhonchi, no crackles, no IWOB on RA Abdomen: Soft, mildly tender in epigastric region, ND, normal bowel sounds present Skin: erythema along inguinal crease R>L Extremities: No edema   Laboratory: Recent Labs  Lab 09/09/19 0422 09/10/19 0247 09/11/19 0206  WBC 4.2 3.9* 5.8  HGB 9.1* 9.3* 9.6*  HCT 27.2* 27.5* 28.1*  PLT 117* 101* 119*   Recent Labs  Lab 09/05/19 0328 09/05/19 0328 09/06/19 0751 09/06/19 1413 09/09/19 0422 09/10/19 0247 09/11/19 0206  NA 130*   < > 125*   < > 128* 129* 128*  K 4.2   < > 3.7   < > 3.8 3.5 3.7  CL 102   < > 100   < > 106 105 105  CO2 19*   < > 18*   < > 15* 16* 18*  BUN 12   < > 8   < > <5* 6* 7*  CREATININE 0.73   < > 0.66   < > 0.60 0.49 0.49  CALCIUM 7.1*   < > 7.1*   < > 6.6* 6.8* 6.8*  PROT 4.6*  --  4.5*  --   --   --   --   BILITOT 1.3*  --  1.0  --   --   --   --   ALKPHOS 52  --  57  --   --   --   --   ALT 28  --  29  --   --   --   --   AST 24  --  31  --   --   --   --   GLUCOSE 150*   < > 161*   < > 201* 183* 183*   < > = values in this interval not displayed.    Imaging/Diagnostic Tests: No results found.   Gladys Damme, MD 09/11/2019, 1:14 PM PGY-1, [place] Intern pager: [phone], text pages welcome

## 2019-09-11 NOTE — Progress Notes (Signed)
Patient ID: Tracey Riddle, female   DOB: 1951-04-07, 69 y.o.   MRN: [id number]          [facility]    Date of Admission:  09/03/2019   Day 5 fluconazole        Day 3 ivermectin  [name]'s nurse reports that she is doing a little bit better today.  She has had some mild intermittent nausea but no vomiting.  Her appetite is improved and she is eating better.  I recommend continuing fluconazole through the weekend and continuing ivermectin for a full 2-week course.  Please call Dr. Daiva Eves [phone]) for any infectious disease questions this weekend.         Cliffton Asters, MD [facility] [phone] pager   [phone] cell 09/11/2019, 4:24 PM

## 2019-09-11 NOTE — Progress Notes (Signed)
Inpatient Diabetes Program Recommendations  AACE/ADA: New Consensus Statement on Inpatient Glycemic Control (2015)  Target Ranges:  Prepandial:   less than 140 mg/dL      Peak postprandial:   less than 180 mg/dL (1-2 hours)      Critically ill patients:  140 - 180 mg/dL   Lab Results  Component Value Date   GLUCAP 165 (H) 09/11/2019   HGBA1C 8.0 (H) 08/26/2019    Review of Glycemic Control Results for Tracey Riddle, Tracey Riddle (MRN [id number]) as of 09/11/2019 11:06  Ref. Range 09/10/2019 05:30 09/10/2019 17:33 09/10/2019 21:03 09/11/2019 05:16  Glucose-Capillary Latest Ref Range: 70 - 99 mg/dL 670 (H) 141 (H) 030 (H) 165 (H)    In the setting of steroids, Consider adding back Novolog 0-9 units Q4H.   Thanks, Lujean Rave, MSN, RNC-OB Diabetes Coordinator [phone] (8a-5p)

## 2019-09-11 NOTE — Progress Notes (Signed)
Physical Therapy Treatment Patient Details Name: Tracey Riddle MRN: [id number] DOB: 1951-06-07 Today's Date: 09/11/2019    History of Present Illness Pt is a 69 y/o female admitted secondary to worsening abdominal pain. Found to have hyponatremia. Pt with recent admission for sepsis from viral gastroenteritis. S/P EGD 3/23.PMH includes CKD, DM, and TIA.     PT Comments    Pt was seen for mobility to sit on side of bed during removal of bedpan, and was able to get interpreter from stick with phone call to PCI, Sam #[id number].  Pt is slow to move, in some leg pain and called nursing to get meds.  Up in chair with call light in lap, reviewed with interpreter with mult repetitions needed.  Pt is reluctant to get up but has been able to move, and reported she wants to walk alone eventually again.   Follow Up Recommendations  Home health PT;Supervision/Assistance - 24 hour     Equipment Recommendations  Wheelchair (measurements PT);Wheelchair cushion (measurements PT);Hospital bed    Recommendations for Other Services       Precautions / Restrictions Precautions Precautions: Fall Precaution Comments: interpretation of similar dialect available Restrictions Weight Bearing Restrictions: No    Mobility  Bed Mobility Overal bed mobility: Needs Assistance Bed Mobility: Rolling;Sidelying to Sit Rolling: Min assist Sidelying to sit: Min assist       General bed mobility comments: mod assist to sit up with pt following cues to move hands and legs  Transfers Overall transfer level: Needs assistance Equipment used: Rolling walker (2 wheeled) Transfers: Sit to/from [employer] Sit to Stand: Min assist;+2 physical assistance;+2 safety/equipment Stand pivot transfers: Min assist;+2 physical assistance;+2 safety/equipment       General transfer comment: min assist with 2 person physical prompting and verbal cues via translation  Ambulation/Gait                  Stairs             Wheelchair Mobility    Modified Rankin (Stroke Patients Only)       Balance Overall balance assessment: Needs assistance Sitting-balance support: Feet supported Sitting balance-Leahy Scale: Good     Standing balance support: During functional activity;Bilateral upper extremity supported Standing balance-Leahy Scale: Poor Standing balance comment: requires RW and two person help to support with min assist and dense repetitive cues                            Cognition Arousal/Alertness: Awake/alert Behavior During Therapy: Flat affect Overall Cognitive Status: Difficult to assess                                        Exercises General Exercises - Lower Extremity Ankle Circles/Pumps: AAROM;10 reps Long Arc Quad: AAROM;10 reps Heel Slides: AAROM;10 reps    General Comments General comments (skin integrity, edema, etc.): pt was seen via interpreter but initiially was given minor gestures and prompts, pt speaks a little [place]      Pertinent Vitals/Pain Pain Assessment: Faces Faces Pain Scale: Hurts little more Pain Location: upper legs/thighs Pain Descriptors / Indicators: Discomfort Pain Intervention(s): Limited activity within patient's tolerance;Monitored during session;Repositioned    Home Living                      Prior Function  PT Goals (current goals can now be found in the care plan section) Acute Rehab PT Goals Patient Stated Goal: to walk alone again Progress towards PT goals: Progressing toward goals    Frequency    Min 3X/week      PT Plan Current plan remains appropriate    Co-evaluation PT/OT/SLP Co-Evaluation/Treatment: Yes Reason for Co-Treatment: For patient/therapist safety;Complexity of the patient's impairments (multi-system involvement) PT goals addressed during session: Mobility/safety with mobility;Proper use of DME;Balance OT goals addressed during  session: ADL's and self-care      AM-PAC PT "6 Clicks" Mobility   Outcome Measure  Help needed turning from your back to your side while in a flat bed without using bedrails?: A Little Help needed moving from lying on your back to sitting on the side of a flat bed without using bedrails?: A Lot Help needed moving to and from a bed to a chair (including a wheelchair)?: A Lot Help needed standing up from a chair using your arms (e.g., wheelchair or bedside chair)?: A Lot Help needed to walk in hospital room?: A Lot Help needed climbing 3-5 steps with a railing? : Total 6 Click Score: 12    End of Session Equipment Utilized During Treatment: Gait belt Activity Tolerance: Patient limited by fatigue Patient left: in chair;with call bell/phone within reach;with family/visitor present;with chair alarm set Nurse Communication: Mobility status PT Visit Diagnosis: Other abnormalities of gait and mobility (R26.89);Muscle weakness (generalized) (M62.81)     Time: 1740-8144 PT Time Calculation (min) (ACUTE ONLY): 25 min  Charges:  $Therapeutic Activity: 8-22 mins                   Ramond Dial 09/11/2019, 2:31 PM  Mee Hives, PT MS Acute Rehab Dept. Number: [address]

## 2019-09-11 NOTE — Plan of Care (Signed)
  Problem: Education: Goal: Knowledge of General Education information will improve Description Including pain rating scale, medication(s)/side effects and non-pharmacologic comfort measures Outcome: Progressing   

## 2019-09-12 LAB — BASIC METABOLIC PANEL
Anion gap: 7 (ref 5–15)
BUN: 8 mg/dL (ref 8–23)
CO2: 20 mmol/L — ABNORMAL LOW (ref 22–32)
Calcium: 7.4 mg/dL — ABNORMAL LOW (ref 8.9–10.3)
Chloride: 104 mmol/L (ref 98–111)
Creatinine, Ser: 0.51 mg/dL (ref 0.44–1.00)
GFR calc Af Amer: >60 mL/min (ref >60)
GFR calc non Af Amer: >60 mL/min (ref >60)
Glucose, Bld: 193 mg/dL — ABNORMAL HIGH (ref 70–99)
Potassium: 3.4 mmol/L — ABNORMAL LOW (ref 3.5–5.1)
Sodium: 131 mmol/L — ABNORMAL LOW (ref 135–145)

## 2019-09-12 LAB — CBC
HCT: 26.6 % — ABNORMAL LOW (ref 36.0–46.0)
Hemoglobin: 9.1 g/dL — ABNORMAL LOW (ref 12.0–15.0)
MCH: 32.9 pg (ref 26.0–34.0)
MCHC: 34.2 g/dL (ref 30.0–36.0)
MCV: 96 fL (ref 80.0–100.0)
Platelets: 105 10*3/uL — ABNORMAL LOW (ref 150–400)
RBC: 2.77 MIL/uL — ABNORMAL LOW (ref 3.87–5.11)
RDW: 13.6 % (ref 11.5–15.5)
WBC: 5.5 10*3/uL (ref 4.0–10.5)
nRBC: 2.5 % — ABNORMAL HIGH (ref 0.0–0.2)

## 2019-09-12 LAB — GLUCOSE, CAPILLARY
Glucose-Capillary: 187 mg/dL — ABNORMAL HIGH (ref 70–99)
Glucose-Capillary: 347 mg/dL — ABNORMAL HIGH (ref 70–99)
Glucose-Capillary: 291 mg/dL — ABNORMAL HIGH (ref 70–99)

## 2019-09-12 MED ORDER — INSULIN ASPART 100 UNIT/ML ~~LOC~~ SOLN
0.0000 [IU] | Freq: Three times a day (TID) | SUBCUTANEOUS | Status: DC
  Administered 2019-09-13: 18:00:00 5 [IU] via SUBCUTANEOUS
  Administered 2019-09-13: 7 [IU] via SUBCUTANEOUS
  Administered 2019-09-13: 2 [IU] via SUBCUTANEOUS
  Administered 2019-09-14: 3 [IU] via SUBCUTANEOUS
  Administered 2019-09-14: 12:00:00 7 [IU] via SUBCUTANEOUS
  Administered 2019-09-14 – 2019-09-15 (×2): 3 [IU] via SUBCUTANEOUS
  Administered 2019-09-15: 08:00:00 2 [IU] via SUBCUTANEOUS
  Administered 2019-09-15: 12:00:00 3 [IU] via SUBCUTANEOUS

## 2019-09-12 MED ORDER — PREDNISONE 5 MG PO TABS: 12.5000 mg | ORAL_TABLET | Freq: Every day | ORAL | Status: DC

## 2019-09-12 MED ORDER — PREDNISONE 20 MG PO TABS
20.0000 mg | ORAL_TABLET | Freq: Every day | ORAL | Status: AC
  Administered 2019-09-13 – 2019-09-15 (×3): 20 mg via ORAL
  Filled 2019-09-12 (×3): qty 1

## 2019-09-12 MED ORDER — POLYETHYLENE GLYCOL 3350 17 G PO PACK
17.0000 g | PACK | Freq: Every day | ORAL | Status: DC
  Administered 2019-09-13 – 2019-09-14 (×2): 17 g via ORAL
  Filled 2019-09-12 (×2): qty 1

## 2019-09-12 MED ORDER — PANTOPRAZOLE SODIUM 40 MG PO TBEC
40.0000 mg | DELAYED_RELEASE_TABLET | Freq: Two times a day (BID) | ORAL | Status: DC
  Administered 2019-09-12 – 2019-09-15 (×7): 40 mg via ORAL
  Filled 2019-09-12 (×7): qty 1

## 2019-09-12 MED ORDER — POTASSIUM CHLORIDE CRYS ER 20 MEQ PO TBCR
40.0000 meq | EXTENDED_RELEASE_TABLET | Freq: Once | ORAL | Status: AC
  Administered 2019-09-12: 12:00:00 40 meq via ORAL
  Filled 2019-09-12: qty 2

## 2019-09-12 MED ORDER — PREDNISONE 5 MG PO TABS: 6.0000 mg | ORAL_TABLET | Freq: Every day | ORAL | Status: DC

## 2019-09-12 NOTE — Progress Notes (Signed)
Family Medicine Teaching Service Daily Progress Note Intern Pager: [phone]  Patient name: Tracey Riddle Medical record number: [id number] Date of birth: 11-24-1950 Age: 69 y.o. Gender: female  Primary Care Provider: System, Pcp Not In Consultants: GI Code Status: full  Pt Overview and Major Events to Date:  3/19- admitted 3/22- EGD 3/23- Strongyloides on surgical pathology  Assessment and Plan: Tracey Riddle is a 70 y.o. female presenting with intractable abdominal pain and nausea. 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  Reports that her stomach is feeling better this morning but would like to have a bowel movement.  Surgical pathology cultured positive for strongyloides stercoralis. Continuing treatment with fluconazole and ivermectin per ID. GI recommends continuing IV protonix BID. Patient is passing gas, but has not had a BM in several days. Now that she no longer has emesis, will give miralax. Discussed this case with patient's neurologist, Dr. Shearon Stalls at [facility], will continue taper of prednisone,see myositis problem below for plan. Progress is slow, but steady.  -GI following appreciate recommendations.  -ID consulted, appreciate recommendations -Protonix 40mg  IV BID -FLD, ADAT to soft diet -Ivermectin day 4/14 -Fluconazole 200 mg PO daily, day 5/21  -Scheduled MiraLAX  Hypotonic Hyponatremia Na 123>>131>128>131. Patient has strongyloides with nearly 2 weeks of emesis and diarrhea. Likely cause of hypotonic hyponatremia.  Soft diet today.  Decrease IV fluids to 10 mL/h.. Will continue to monitor. Previous urine studies indicative of hypotonic hyponatremia. - NS 10 mL/hr - strict I/Os - BMP daily  Hepatitis C RUQ NU:[id number] liver with lobulated surface, no mass or ascites. Negative gallbladder.  AST, ALT wnl.  Total bili 1.7>1.5>1.3>1.0, direct= 0.3, indirect 1.2. Quant Hep C: 4.5 million. -f/u hepC genotype 4 -ID consulted, will treat in the outpatient setting -Will give Hepatitis B vaccines prior to discharge  Bacteremia- resolved Patient completed treatment for Pseudomonas and E. coli bacteremia 3/21.  Blood cultures on this admission no growth at 4 days. Urine culture positive for 60K CFU yeast. Treating esophageal candidiasis with fluconazole, see above. - levaquin (3/15-3/18) - cefepime (3/19-3/21)  DMT2 CBGs ranged from [phone] overnight. No aspart yesterday, methylprednisolone 20 mg daily.  A1c 8%. Home medications include lispro 5-10U before meals. -CBG BID  -sSSI - goal CBGs at the moment <180  H/o RA  Myositis Discussed case with Dr. Shearon Stalls of [facility] today. Patient had muscle biopsy that was nonspecific, greatest concern for crush injury. Dr. Shearon Stalls recommends taper. Plan today to decrease steroids to oral prednisone equivalent of 20mg  x3d, 15mg  x3d, 10mg  x3d, 5mg  x3 days and then discontinue.  Transition today to oral prednisone.  We will start at 20 mg oral Pred x3 days, 12.5 mg x 3 days, 6 mg x 3 days, and then discontinue. -Patient to follow up with Dr. Shearon Stalls and [facility] -Prednisone taper per pharmacy  H/O TIA Lipid panel wnl except HDL 32. Home medication lipitor 40 mg, ASA 81mg . -Restart lipitor home medication -Continue ASA 81 mg  Constipation Patient has not had a bowel movement, but has not had significant intake due to nausea and emesis. Will add in miralax as patient tolerates more solid food, but no indication to start yet.  Normocytic Anemia Hgb 9.3, stable from yesterday, was 12.9 on admission, has been receiving IV fluids, could have slight dilutional component.  MCV 96.1.  Was around 11 on previous  admission. - monitor CBC - consider outpatient colonoscopy  FEN/GI: CLD, ADAT Prophylaxis: lovenox  Disposition: pending continued  treatment of infections, eventually home with [facility] - orders in  Subjective:  Patient is doing well this morning.  Reports that her stomach feels better but she would like to have a bowel movement.  Denies any vomiting over the last 24 hours.  Is tolerating diet well.  An interpreter was used for this entire exam.  Objective: Temp:  [97.8 F (36.6 C)-99 F (37.2 C)] 98.6 F (37 C) (03/27 0622) Pulse Rate:  [92-96] 92 (03/27 0622) Resp:  [16-20] 16 (03/27 0622) BP: (112-131)/(65-84) 112/65 (03/27 0622) SpO2:  [94 %-95 %] 95 % (03/27 0622)  Physical Exam:  General: Resting comfortably, African-American woman in no acute distress Cardio: Regular rate and rhythm, no murmurs appreciated Lungs: Normal work of breathing, clear to auscultation bilaterally, no wheezes noted Abdomen: Soft, mild tenderness in the epigastric region but patient reports it is improved.  Bowel sounds present Skin: erythema along inguinal crease R>L Extremities: No edema   Laboratory: Recent Labs  Lab 09/09/19 0422 09/10/19 0247 09/11/19 0206  WBC 4.2 3.9* 5.8  HGB 9.1* 9.3* 9.6*  HCT 27.2* 27.5* 28.1*  PLT 117* 101* 119*   Recent Labs  Lab 09/06/19 0751 09/06/19 1413 09/09/19 0422 09/10/19 0247 09/11/19 0206  NA 125*   < > 128* 129* 128*  K 3.7   < > 3.8 3.5 3.7  CL 100   < > 106 105 105  CO2 18*   < > 15* 16* 18*  BUN 8   < > <5* 6* 7*  CREATININE 0.66   < > 0.60 0.49 0.49  CALCIUM 7.1*   < > 6.6* 6.8* 6.8*  PROT 4.5*  --   --   --   --   BILITOT 1.0  --   --   --   --   ALKPHOS 57  --   --   --   --   ALT 29  --   --   --   --   AST 31  --   --   --   --   GLUCOSE 161*   < > 201* 183* 183*   < > = values in this interval not displayed.    Imaging/Diagnostic Tests: No results found.  Derrel Nip, MD 09/12/2019, 7:11 AM PGY-1, [facility] Family Medicine FPTS Intern pager: [phone], text pages welcome

## 2019-09-13 DIAGNOSIS — E1165 Type 2 diabetes mellitus with hyperglycemia: Secondary | ICD-10-CM

## 2019-09-13 LAB — BASIC METABOLIC PANEL
Anion gap: 8 (ref 5–15)
BUN: 11 mg/dL (ref 8–23)
CO2: 20 mmol/L — ABNORMAL LOW (ref 22–32)
Calcium: 7.7 mg/dL — ABNORMAL LOW (ref 8.9–10.3)
Chloride: 104 mmol/L (ref 98–111)
Creatinine, Ser: 0.62 mg/dL (ref 0.44–1.00)
GFR calc Af Amer: >60 mL/min (ref >60)
GFR calc non Af Amer: >60 mL/min (ref >60)
Glucose, Bld: 234 mg/dL — ABNORMAL HIGH (ref 70–99)
Potassium: 4 mmol/L (ref 3.5–5.1)
Sodium: 132 mmol/L — ABNORMAL LOW (ref 135–145)

## 2019-09-13 LAB — CBC
HCT: 30.1 % — ABNORMAL LOW (ref 36.0–46.0)
Hemoglobin: 10 g/dL — ABNORMAL LOW (ref 12.0–15.0)
MCH: 33 pg (ref 26.0–34.0)
MCHC: 33.2 g/dL (ref 30.0–36.0)
MCV: 99.3 fL (ref 80.0–100.0)
Platelets: 120 10*3/uL — ABNORMAL LOW (ref 150–400)
RBC: 3.03 MIL/uL — ABNORMAL LOW (ref 3.87–5.11)
RDW: 13.9 % (ref 11.5–15.5)
WBC: 6.2 10*3/uL (ref 4.0–10.5)
nRBC: 3.4 % — ABNORMAL HIGH (ref 0.0–0.2)

## 2019-09-13 LAB — GLUCOSE, CAPILLARY
Glucose-Capillary: 187 mg/dL — ABNORMAL HIGH (ref 70–99)
Glucose-Capillary: 302 mg/dL — ABNORMAL HIGH (ref 70–99)
Glucose-Capillary: 269 mg/dL — ABNORMAL HIGH (ref 70–99)
Glucose-Capillary: 271 mg/dL — ABNORMAL HIGH (ref 70–99)

## 2019-09-13 MED ORDER — METFORMIN HCL 500 MG PO TABS
500.0000 mg | ORAL_TABLET | Freq: Every day | ORAL | Status: DC
  Administered 2019-09-13 – 2019-09-14 (×2): 500 mg via ORAL
  Filled 2019-09-13 (×2): qty 1

## 2019-09-13 NOTE — Progress Notes (Signed)
Family Medicine Teaching Service Daily Progress Note Intern Pager: [phone]  Patient name: Tracey Riddle Medical record number: [id number] Date of birth: 1950/12/08 Age: 69 y.o. Gender: female  Primary Care Provider: System, Pcp Not In Consultants: GI Code Status: full  Pt Overview and Major Events to Date:  3/19- admitted 3/22- EGD 3/23- Strongyloides on surgical pathology  Assessment and Plan: Tracey Riddle is a 69 y.o. female presenting with intractable abdominal pain and nausea. 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 Surgical pathology cultured positive for strongyloides stercoralis. Continuing treatment with fluconazole and ivermectin per ID. GI recommends continuing IV protonix BID. Patient is passing gas, has not had BM despite miralax started yesterday, will continue to titrate. Discussed this case with patient's neurologist, Dr. Celine Riddle at [facility], will continue taper of prednisone,see myositis problem below for plan. Progress is slow, but steady. Patient is medically stable, plan for discharge tomorrow.  -GI following appreciate recommendations.  -ID consulted, appreciate recommendations -Protonix 40mg  IV BID -ADAT to regular/carb modified diet -Ivermectin day 5/14 -Fluconazole 200 mg PO daily, day 6/21  -Scheduled MiraLAX  Hypotonic Hyponatremia Na 123>>131>128>>132. Patient has strongyloides with nearly 2 weeks of emesis and diarrhea. Likely cause of hypotonic hyponatremia, expect this will improve as patient eats more.  Increasing to carb modified diet today.  Continue KVO IVF. Will continue to monitor. Previous urine studies indicative of hypotonic hyponatremia. - NS 10 mL/hr - strict I/Os - BMP daily  Hepatitis C RUQ 7/21 liver with lobulated surface,  no mass or ascites. Negative gallbladder.  AST, ALT wnl. Total bili 1.7>1.5>1.3>1.0, direct= 0.3, indirect 1.2. Quant Hep C: 4.5 million. -f/u hepC genotype 4 -ID consulted, will treat in the outpatient setting -Will give Hepatitis B vaccines prior to discharge  Bacteremia- resolved Patient completed treatment for Pseudomonas and E. coli bacteremia 3/21.  Blood cultures on this admission no growth at 4 days. Urine culture positive for 60K CFU yeast. Treating esophageal candidiasis with fluconazole, see above. - levaquin (3/15-3/18) - cefepime (3/19-3/21)  DMT2 CBGs ranged from [phone] overnight. Received 2U aspart yesterday, on prednisone 20 mg, see below for taper.  A1c 8%. Now that patient is tolerating regular meals and cr is WNL, will start metformin 500mg  daily. Home medications include lispro 5-10U before meals. -CBG BID  -sSSI - goal CBGs at the moment <180 -start metformin 500mg  qd  H/o RA  Myositis Discussed case with Dr. [phone] of [facility] today. Patient had muscle biopsy that was nonspecific, greatest concern for crush injury. Dr. recommends taper. Plan today to decrease steroids to oral prednisone equivalent of 20mg  x3d, 15mg  x3d, 10mg  x3d, 5mg  x3 days and then discontinue.  Transition today to oral prednisone.  We will start at 20 mg oral Pred x3 days, 12.5 mg x 3 days, 6 mg x 3 days, and then discontinue. -Patient to follow up with Dr. Celine Riddle and [facility] -Prednisone taper per pharmacy  H/O TIA Lipid panel wnl except HDL 32. Home medication lipitor 40 mg, ASA 81mg . -Restart lipitor home medication -Continue ASA 81 mg  Constipation Patient has not had a bowel movement, but has not had significant intake due to nausea and emesis. Will titrate miralax as patient tolerates more solid food, but no indication to start yet.  Normocytic Anemia Hgb 10, stable from yesterday, was 12.9  on admission, has been receiving IV fluids, could have slight  dilutional component.  MCV 96.1.  Was around 11 on previous admission. - monitor CBC - consider outpatient colonoscopy  FEN/GI: CLD, ADAT Prophylaxis: lovenox  Disposition: pending continued treatment of infections, likely tomorrow, eventually home with [facility] - orders in  Subjective:  Patient overall feels improved this morning, tolerating diet well. Her biggest concern is having trouble going to bathroom and getting up on her own. She would prefer not to go to a SNF. Will discuss with her family members plan for discharge, which will likely happen tomorrow.  An interpreter was used for this entire exam.  Objective: Temp:  [98 F (36.7 C)-98.6 F (37 C)] 98 F (36.7 C) (03/28 0514) Pulse Rate:  [86-89] 86 (03/28 0514) Resp:  [17-18] 17 (03/28 0514) BP: (111-122)/(78) 111/78 (03/28 0514) SpO2:  [95 %-100 %] 100 % (03/28 0514)  Physical Exam:  General: Resting comfortably, obese African woman in no acute distress Cardio: Regular rate and rhythm, no murmurs appreciated Lungs: Normal work of breathing, clear to auscultation bilaterally, no wheezes noted Abdomen: Soft, mild tenderness in the epigastric region but patient reports it is improved.  Bowel sounds present Skin: decreased erythema along inguinal crease R>L Extremities: edema present   Laboratory: Recent Labs  Lab 09/11/19 0206 09/12/19 0600 09/13/19 0252  WBC 5.8 5.5 6.2  HGB 9.6* 9.1* 10.0*  HCT 28.1* 26.6* 30.1*  PLT 119* 105* 120*   Recent Labs  Lab 09/11/19 0206 09/12/19 0600 09/13/19 0252  NA 128* 131* 132*  K 3.7 3.4* 4.0  CL 105 104 104  CO2 18* 20* 20*  BUN 7* 8 11  CREATININE 0.49 0.51 0.62  CALCIUM 6.8* 7.4* 7.7*  GLUCOSE 183* 193* 234*    Imaging/Diagnostic Tests: No results found.  Gladys Damme, MD 09/13/2019, 1:50 PM PGY-1, [place] Intern pager: [phone], text pages welcome

## 2019-09-14 LAB — BASIC METABOLIC PANEL
Anion gap: 7 (ref 5–15)
BUN: 14 mg/dL (ref 8–23)
CO2: 22 mmol/L (ref 22–32)
Calcium: 7.5 mg/dL — ABNORMAL LOW (ref 8.9–10.3)
Chloride: 103 mmol/L (ref 98–111)
Creatinine, Ser: 0.6 mg/dL (ref 0.44–1.00)
GFR calc Af Amer: >60 mL/min (ref >60)
GFR calc non Af Amer: >60 mL/min (ref >60)
Glucose, Bld: 200 mg/dL — ABNORMAL HIGH (ref 70–99)
Potassium: 4.2 mmol/L (ref 3.5–5.1)
Sodium: 132 mmol/L — ABNORMAL LOW (ref 135–145)

## 2019-09-14 LAB — GLUCOSE, CAPILLARY
Glucose-Capillary: 209 mg/dL — ABNORMAL HIGH (ref 70–99)
Glucose-Capillary: 304 mg/dL — ABNORMAL HIGH (ref 70–99)
Glucose-Capillary: 233 mg/dL — ABNORMAL HIGH (ref 70–99)
Glucose-Capillary: 152 mg/dL — ABNORMAL HIGH (ref 70–99)

## 2019-09-14 LAB — CBC
HCT: 31.2 % — ABNORMAL LOW (ref 36.0–46.0)
Hemoglobin: 10.2 g/dL — ABNORMAL LOW (ref 12.0–15.0)
MCH: 32.3 pg (ref 26.0–34.0)
MCHC: 32.7 g/dL (ref 30.0–36.0)
MCV: 98.7 fL (ref 80.0–100.0)
Platelets: 106 10*3/uL — ABNORMAL LOW (ref 150–400)
RBC: 3.16 MIL/uL — ABNORMAL LOW (ref 3.87–5.11)
RDW: 13.9 % (ref 11.5–15.5)
WBC: 6.3 10*3/uL (ref 4.0–10.5)
nRBC: 2.5 % — ABNORMAL HIGH (ref 0.0–0.2)

## 2019-09-14 LAB — FERRITIN: Ferritin: 78 ng/mL (ref 11–307)

## 2019-09-14 LAB — IRON AND TIBC
Iron: 29 ug/dL (ref 28–170)
Saturation Ratios: 19 % (ref 10.4–31.8)
TIBC: 150 ug/dL — ABNORMAL LOW (ref 250–450)
UIBC: 121 ug/dL

## 2019-09-14 LAB — VITAMIN B12: Vitamin B-12: 1022 pg/mL — ABNORMAL HIGH (ref 180–914)

## 2019-09-14 LAB — SARS CORONAVIRUS 2 (TAT 6-24 HRS): SARS Coronavirus 2: NEGATIVE

## 2019-09-14 MED ORDER — METFORMIN HCL 500 MG PO TABS: 500.0000 mg | ORAL_TABLET | Freq: Two times a day (BID) | ORAL | Status: DC

## 2019-09-14 MED ORDER — SENNA 8.6 MG PO TABS
1.0000 | ORAL_TABLET | Freq: Every day | ORAL | Status: DC
  Administered 2019-09-14: 8.6 mg via ORAL
  Filled 2019-09-14: qty 1

## 2019-09-14 MED ORDER — METFORMIN HCL ER 500 MG PO TB24
1000.0000 mg | ORAL_TABLET | Freq: Every day | ORAL | Status: DC
  Administered 2019-09-15: 08:00:00 1000 mg via ORAL
  Filled 2019-09-14 (×2): qty 2

## 2019-09-14 MED ORDER — ONDANSETRON HCL 4 MG PO TABS: 4.0000 mg | ORAL_TABLET | Freq: Three times a day (TID) | ORAL | Status: DC | PRN

## 2019-09-14 MED ORDER — POLYETHYLENE GLYCOL 3350 17 G PO PACK
17.0000 g | PACK | Freq: Two times a day (BID) | ORAL | Status: DC
  Administered 2019-09-14 – 2019-09-15 (×2): 17 g via ORAL
  Filled 2019-09-14 (×3): qty 1

## 2019-09-14 NOTE — Progress Notes (Signed)
Patient ID: Tracey Riddle, female   DOB: [birth year], 69 y.o.   MRN: [id number]          [facility]    Date of Admission:  09/03/2019   Day 8 fluconazole        Day 6 ivermectin  Ms. Oros is resting quietly and comfortably in bed.  I spoke with her niece by phone today.  She said that [name] he is getting better.  She is not having any more problems with nausea or vomiting.  She remains very weak and her appetite is poor but she is feeling better.  I recommend changing fluconazole to 100 mg weekly as candidal prophylaxis while she remains on prednisone.  She needs 8 more days of ivermectin through 09/22/2019.  I have arranged follow-up in our clinic on 10/01/2019.  I will sign off now.         Cliffton Asters, MD [facility] [phone] pager   [phone] cell 09/14/2019, 11:17 AM

## 2019-09-14 NOTE — Progress Notes (Signed)
Patient transferred from bed to chair, with maximum assist using Stedy, tolerated well.

## 2019-09-14 NOTE — NC FL2 (Signed)
MEDICAID FL2 LEVEL OF CARE SCREENING TOOL     IDENTIFICATION  Patient Name: Tracey Riddle Birthdate: March 30, 1951 Sex: female Admission Date (Current Location): 09/03/2019  [facility] and [place] Number:  Producer, television/film/video and Address:  [facility] [place], [address], [place], [place] [address]      Provider Number: [id number]  Attending Physician Name and Address:  Westley Chandler, MD  Relative Name and Phone Number:  Renee Harder [phone]    Current Level of Care: Hospital Recommended Level of Care: Skilled Nursing Facility Prior Approval Number:    Date Approved/Denied:   PASRR Number: [id number]  Discharge Plan: SNF    Current Diagnoses: Patient Active Problem List   Diagnosis Date Noted  . Intestinal strongyloidiasis 09/08/2019  . Chronic viral hepatitis C (HCC) 09/08/2019  . Immunosuppression due to drug therapy 09/08/2019  . Esophageal candidiasis (HCC) 09/08/2019  . Non-intractable vomiting   . Diarrhea 08/27/2019  . Hyponatremia 08/27/2019  . Pyuria 08/27/2019  . Abnormal liver CT 08/27/2019  . Diabetes mellitus type II, controlled (HCC) 08/27/2019  . Pseudomonas sepsis (HCC) 08/27/2019  . Bacteremia due to Escherichia coli 08/27/2019  . Hypoproteinemia (HCC) 08/27/2019  . Thrombocytopenia (HCC) 08/27/2019  . Long-term corticosteroid use 08/27/2019    Orientation RESPIRATION BLADDER Height & Weight     Self, Time, Situation, Place  Normal Continent Weight: 86.9 kg Height:  5\' 3"  (160 cm)  BEHAVIORAL SYMPTOMS/MOOD NEUROLOGICAL BOWEL NUTRITION STATUS      Continent Diet(Regular)  AMBULATORY STATUS COMMUNICATION OF NEEDS Skin   Limited Assist Verbally(Interpreter Kinyarwanda ) Other (Comment)(Moisture perineum left thigh inter dry)                       Personal Care Assistance Level of Assistance  Bathing, Feeding, Dressing Bathing Assistance: Limited assistance Feeding assistance: Limited  assistance Dressing Assistance: Limited assistance     Functional Limitations Info             SPECIAL CARE FACTORS FREQUENCY  PT (By licensed PT), OT (By licensed OT)     PT Frequency: five times a week OT Frequency: five times a week            Contractures Contractures Info: Not present    Additional Factors Info  Code Status, Allergies, Insulin Sliding Scale, Isolation Precautions Code Status Info: Full Allergies Info: No Known allergies   Insulin Sliding Scale Info: Novolog 0 to 9 units daily with meals Isolation Precautions Info: ESBL     Current Medications (09/14/2019):  This is the current hospital active medication list Current Facility-Administered Medications  Medication Dose Route Frequency Provider Last Rate Last Admin  . 0.9 %  sodium chloride infusion   Intravenous Continuous 09/16/2019, MD 10 mL/hr at 09/12/19 1852 New Bag at 09/12/19 1852  . acetaminophen (TYLENOL) tablet 650 mg  650 mg Oral Q6H 09/14/19, MD   650 mg at 09/14/19 [phone]  . aspirin EC tablet 81 mg  81 mg Oral Daily [id number], MD   81 mg at 09/14/19 09/16/19  . atorvastatin (LIPITOR) tablet 40 mg  40 mg Oral q1800 [id number], MD   40 mg at 09/13/19 1806  . enoxaparin (LOVENOX) injection 40 mg  40 mg Subcutaneous Q24H 1807, MD   40 mg at 09/13/19 1500  . feeding supplement (PRO-STAT SUGAR FREE 64) liquid 30 mL  30 mL Oral TID BM 09/15/19, MD  30 mL at 09/14/19 0918  . fluconazole (DIFLUCAN) tablet 200 mg  200 mg Oral Daily Vallery Sa, Student-PharmD   200 mg at 09/14/19 2774  . guaiFENesin (ROBITUSSIN) 100 MG/5ML solution 100 mg  5 mL Oral Q4H PRN Martyn Malay, MD   100 mg at 09/10/19 1847  . insulin aspart (novoLOG) injection 0-9 Units  0-9 Units Subcutaneous TID WC Gifford Shave, MD   3 Units at 09/14/19 0759  . ivermectin (STROMECTOL) tablet 18,000 mcg  200 mcg/kg Oral QPM [place] Callas, NP   18,000 mcg at 09/13/19 1950  . liver  oil-zinc oxide (DESITIN) 40 % ointment 1 application  1 application Topical Daily Gifford Shave, MD   1 application at 12/87/86 0919  . metFORMIN (GLUCOPHAGE) tablet 500 mg  500 mg Oral Q breakfast Anderson, Chelsey L, DO   500 mg at 09/14/19 0729  . multivitamin with minerals tablet 1 tablet  1 tablet Oral Daily Martyn Malay, MD   1 tablet at 09/14/19 [phone]  . ondansetron (ZOFRAN) tablet 4 mg  4 mg Oral Q8H PRN Gladys Damme, MD      . pantoprazole (PROTONIX) EC tablet 40 mg  40 mg Oral BID Martyn Malay, MD   40 mg at 09/14/19 0947  . polyethylene glycol (MIRALAX / GLYCOLAX) packet 17 g  17 g Oral Daily Meccariello, Bernita Raisin, DO   17 g at 09/14/19 0962  . predniSONE (DELTASONE) tablet 20 mg  20 mg Oral Q breakfast Gifford Shave, MD   20 mg at 09/14/19 0729   Followed by  . [START ON 09/16/2019] predniSONE (DELTASONE) tablet 12.5 mg  12.5 mg Oral Q breakfast Gifford Shave, MD       Followed by  . [START ON 09/19/2019] predniSONE (DELTASONE) tablet 6 mg  6 mg Oral Q breakfast Gifford Shave, MD         Discharge Medications: Please see discharge summary for a list of discharge medications.  Relevant Imaging Results:  Relevant Lab Results:   Additional Information SSI: [phone], Ivermectin 18,000 mcg daily last dose 09/22/19, Covid Oct/Nov 2020 , room air , Intestinal strongyloidiasis , Esophageal candidiasis  Kyree Fedorko, Edson Snowball, RN

## 2019-09-14 NOTE — Progress Notes (Signed)
Physical Therapy Treatment Patient Details Name: Tracey Riddle MRN: [id number] DOB: [birth year] Today's Date: 09/14/2019    History of Present Illness Pt is a 69 y/o female admitted secondary to worsening abdominal pain. Found to have hyponatremia. Pt with recent admission for sepsis from viral gastroenteritis. S/P EGD 3/23.PMH includes CKD, DM, and TIA.     PT Comments    Pt received in recliner and agreeable to PT. Tele interpreter used t/o session. Pt with no reports of pain. Pt required heavy mod-max A x2 to stand from recliner. Pt required max vc/tc for upright standing with pt presenting with sig trunk forward flexion and heavy reliance on RW pushing walker out in front. No carryover to cuing for standing upright. Attempted ambulation to bed with heavy assist +2 and max multimodal cuing. Pt took a few very short steps with little foot clearance with sig increased time and effort. Pt with heavy forward lean onto Rw requiring max A for RW mgt. Max multimodal cuing during gait with no carryover. Pt returned to recliner and pushed close to bed. Pt required heavy assist x2 for stand pivot back to bed and max A to return to supine and for repositioning in bed. Pt appearing fatigued throughout session and stating she had been sitting up in recliner for several hours. Pt unable to perform SLR this date for placement of pillow under LEs. Appears pt sig weaker this date than previous therapy note. Pt presents with sig weakness and dec balance and activity tolerance limiting functional mobility. Discharge recommendation updated to STR at SNF following hospital d/c due to current functional deficits and considerable assist required for mobility.     Follow Up Recommendations  Supervision/Assistance - 24 hour;SNF     Equipment Recommendations  Wheelchair (measurements PT);Wheelchair cushion (measurements PT);Hospital bed;3in1 (PT)    Recommendations for Other Services       Precautions /  Restrictions Precautions Precautions: Fall Precaution Comments: interpretation of similar dialect available Restrictions Weight Bearing Restrictions: No    Mobility  Bed Mobility Overal bed mobility: Needs Assistance Bed Mobility: Sit to Supine       Sit to supine: Max assist;+2 for physical assistance;Mod assist   General bed mobility comments: mod-max A +2 to return to supine and for scooting and repositioning in bed, tc for use of bed rails to assist scooting  Transfers Overall transfer level: Needs assistance Equipment used: Rolling walker (2 wheeled) Transfers: Sit to/from [employer] Sit to Stand: Mod assist;Max assist;+2 physical assistance Stand pivot transfers: Max assist;Mod assist;+2 physical assistance       General transfer comment: mod-max A x2 to stand from recliner, x2 attempts, good carrover of hand placement on handrails, extremely forward flexed trunk, slow initiation, pt unable to take appreciable steps from recliner to bed so pushed pt in recliner as close to bed as possible  Ambulation/Gait Ambulation/Gait assistance: Max assist;+2 physical assistance Gait Distance (Feet): 2 Feet Assistive device: Rolling walker (2 wheeled) Gait Pattern/deviations: Step-to pattern;Trunk flexed Gait velocity: dec   General Gait Details: pt max A x2 to attempt ambulation, pt only able to take a few appreciable steps, heavy reliance on RW and sig flexed trunk pushing walker even further out in front increased trunk flexion, unable to take full step with RLE, max vc/tc with little carryover, pt with sig weakness   Stairs             Wheelchair Mobility    Modified Rankin (Stroke Patients Only)  Balance Overall balance assessment: Needs assistance Sitting-balance support: Feet supported;Bilateral upper extremity supported Sitting balance-Leahy Scale: Poor Sitting balance - Comments: required hands on min guard sitting EOB this date    Standing balance support: During functional activity;Bilateral upper extremity supported Standing balance-Leahy Scale: Poor Standing balance comment: two person max A and heavy reliance on Rw for standing, max vc                            Cognition Arousal/Alertness: Awake/alert Behavior During Therapy: Flat affect Overall Cognitive Status: Difficult to assess                                        Exercises      General Comments General comments (skin integrity, edema, etc.): tele interpreter used of similar dialect, pt able to understand some English and would sometimes respond in [place]      Pertinent Vitals/Pain Pain Assessment: No/denies pain    Home Living                      Prior Function            PT Goals (current goals can now be found in the care plan section) Progress towards PT goals: Progressing toward goals    Frequency    Min 3X/week      PT Plan Discharge plan needs to be updated;Equipment recommendations need to be updated    Co-evaluation              AM-PAC PT "6 Clicks" Mobility   Outcome Measure  Help needed turning from your back to your side while in a flat bed without using bedrails?: A Lot Help needed moving from lying on your back to sitting on the side of a flat bed without using bedrails?: A Lot Help needed moving to and from a bed to a chair (including a wheelchair)?: A Lot Help needed standing up from a chair using your arms (e.g., wheelchair or bedside chair)?: A Lot Help needed to walk in hospital room?: Total Help needed climbing 3-5 steps with a railing? : Total 6 Click Score: 10    End of Session Equipment Utilized During Treatment: Gait belt Activity Tolerance: Patient limited by fatigue Patient left: in bed;with call bell/phone within reach;with bed alarm set Nurse Communication: Mobility status PT Visit Diagnosis: Other abnormalities of gait and mobility (R26.89);Muscle  weakness (generalized) (M62.81)     Time: 3151-7616 PT Time Calculation (min) (ACUTE ONLY): 26 min  Charges:  $Therapeutic Activity: 23-37 mins                     Henya Aguallo PT, DPT 4:06 PM,09/14/19    Kimarie Coor Drucilla Chalet 09/14/2019, 3:59 PM

## 2019-09-14 NOTE — Progress Notes (Signed)
Inpatient Diabetes Program Recommendations  AACE/ADA: New Consensus Statement on Inpatient Glycemic Control (2015)  Target Ranges:  Prepandial:   less than 140 mg/dL      Peak postprandial:   less than 180 mg/dL (1-2 hours)      Critically ill patients:  140 - 180 mg/dL   Lab Results  Component Value Date   GLUCAP 209 (H) 09/14/2019   HGBA1C 8.0 (H) 08/26/2019    Review of Glycemic Control Results for Tracey Riddle, Tracey Riddle (MRN [id number]) as of 09/14/2019 09:46  Ref. Range 09/13/2019 07:58 09/13/2019 12:09 09/13/2019 17:30 09/13/2019 21:03 09/14/2019 07:55  Glucose-Capillary Latest Ref Range: 70 - 99 mg/dL 355 (H) 974 (H) 163 (H) 271 (H) 209 (H)   Diabetes history: DM 2 Outpatient Diabetes medications: Humalog 5 units tid for glucose 200-300 range. Humalog 10 units tid for glucose 300-400 range Current orders for Inpatient glycemic control: Metformin 500 mg qd + Novolog 0-9 units tid  Inpatient Diabetes Program Recommendations:   -Novolog 3 units tid meal coverage if eats 50%  Thank you, Darel Hong E. Jermisha Hoffart, RN, MSN, CDE  Diabetes Coordinator Inpatient Glycemic Control Team Team Pager [phone] (8am-5pm) 09/14/2019 9:48 AM

## 2019-09-14 NOTE — TOC Progression Note (Addendum)
Transition of Care [facility]) - Progression Note    Patient Details  Name: Tracey Riddle MRN: [id number] Date of Birth: [birth year]  Transition of Care [facility]) CM/SW Contact  Nadene Rubins Adria Devon, RN Phone Number: 09/14/2019, 10:58 AM  Clinical Narrative:   1449 Provided bed offers to patient and [name] at bedside. [name] aware no visitors at [facility] . [name] and patient chose [place].   Called Archie Patten at [place] aware patient has chosen [place]. Tonya checking to see if they can provide  ivermectin through 09/22/2019. Awaiting call back. ivermectin cost $60, Archie Patten is able to offer bed. Will need new covid test. Messaged MD . MD will order covid test today and discharge patient tomorrow. [name] aware. Archie Patten at [place] will call [name] to arrange a time to do paperwork.  [name] aware. Received a consult that family now wants SNF. Discussed with patient , patient instructed NCM to call [name] regarding SNF. NCM spoke to [place] . [name] does want SNF for short term rehab.   Patient has been to SNF before in [facility] but not in [place].  [name] prefers [place] area. [name] aware there may be no visition in SNF .   Patient faxed out to [place] and surrounding areas. Will provide offers once received.   Expected Discharge Plan: Home w Home Health Services Barriers to Discharge: Continued Medical Work up  Expected Discharge Plan and Services Expected Discharge Plan: Home w Home Health Services   Discharge Planning Services: CM Consult Post Acute Care Choice: Home Health, Durable Medical Equipment Living arrangements for the past 2 months: Single Family Home                 DME Arranged: 3-N-1, Community education officer wheelchair with seat cushion DME Agency: [employer] Date DME Agency Contacted: 09/07/19 Time DME Agency Contacted: [phone] Representative spoke with at DME Agency: Zack HH Arranged: RN, PT, OT HH Agency: [facility] Date HH  Agency Contacted: 09/07/19 Time HH Agency Contacted: [phone] Representative spoke with at [facility] Agency: Tiffany   Social Determinants of Health (SDOH) Interventions    Readmission Risk Interventions No flowsheet data found.

## 2019-09-14 NOTE — Progress Notes (Addendum)
Family Medicine Teaching Service Daily Progress Note Intern Pager: [phone]  Patient name: Tracey Riddle Medical record number: [id number] Date of birth: 25-[name]-1951 Age: 69 y.o. Gender: female  Primary Care Provider: System, Pcp Not In Consultants: GI Code Status: full  Pt Overview and Major Events to Date:  3/19- admitted 3/22- EGD 3/23- Strongyloides on surgical pathology  Assessment and Plan: Tracey Riddle is a 69 y.o. female presenting with intractable abdominal pain and nausea. 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 Surgical pathology cultured positive for strongyloides stercoralis. Continuing treatment with ivermectin per ID to 09/22/19. Fluconazole daily discontinued per ID, will continue fluconazole 100mg  qweekly for prophylaxis while on steroid taper. GI recommends continuing IV protonix BID. Patient is passing gas, has not had BM despite miralax started yesterday, will continue to titrate and add senna. Discussed this case with patient's neurologist, Dr. at [facility], will continue taper of prednisone,see myositis problem below for plan. Progress is slow, but steady. Patient is deconditioned and requires SNF to become stronger before going home to family. SW and PT/OT consulted. OOB to chair today, will have RNs document time OOB today. She is medically ready for discharge, awaiting SNF placement. -GI following appreciate recommendations.  -ID consulted, appreciate recommendations -Protonix 40mg  IV BID -Ivermectin day 6/14 -Fluconazole 100 mg qweekly for ppx -Scheduled MiraLAX, senna -PT/OT eval and treat  Hypotonic Hyponatremia-resolved Na 132, corrected due to glucose is 134. Patient had strongyloides with nearly 2 weeks of emesis and diarrhea, which was likely the cause of hypotonic  hyponatremia, expect this will improve as patient eats more.  Patient now tolerating carb modified diet today. Previous urine studies indicative of hypotonic hyponatremia.  Hepatitis C RUQ liver with lobulated surface, no mass or ascites. Negative gallbladder.  AST, ALT wnl. Total bili 1.7>1.5>1.3>1.0, direct= 0.3, indirect 1.2. Quant Hep C: 4.5 million. -f/u hepC genotype 4 -ID consulted, will treat in the outpatient setting -Will give Hepatitis B vaccines prior to discharge  Bacteremia- resolved Patient completed treatment for Pseudomonas and E. coli bacteremia 3/21.  Blood cultures on this admission no growth at 4 days. Urine culture positive for 60K CFU yeast. Treating esophageal candidiasis with fluconazole, see above. - levaquin (3/15-3/18) - cefepime (3/19-3/21)  DMT2 CBGs ranged from 234-200 overnight. Received 14U aspart yesterday, on prednisone 20 mg, see below for taper.  A1c 8%. Now that patient is tolerating regular meals and cr is WNL, will increase to metformin 1000mg  daily. Home medications include lispro 5-10U before meals. -CBG BID  -sSSI - goal CBGs at the moment <180 -increase metformin 1000mg  qd  H/o RA  Myositis Discussed case with Dr. 10-27-1988 of [facility] today. Patient had muscle biopsy that was nonspecific, greatest concern for crush injury. Dr. recommends taper. Plan today to decrease steroids to oral prednisone equivalent of 20mg  x3d, 15mg  x3d, 10mg  x3d, 5mg  x3 days and then discontinue.  Transition today to oral prednisone.  We will start at 20 mg oral Pred x3 days, 12.5 mg x 3 days, 6 mg x 3 days, and then discontinue. -Patient to follow up with Dr. and [facility] -Prednisone taper per pharmacy  H/O TIA Lipid panel wnl except HDL 32. Home medication lipitor 40 mg, ASA 81mg . -Restart lipitor home medication -Continue ASA 81 mg  Constipation Patient has not had a bowel movement, will continue miralax and  add  senna. -Miralax  -Senna  Normocytic Anemia Hgb 10.2, stable from yesterday, was 12.9 on admission, has been receiving IV fluids, could have slight dilutional component.  MCV 96.1.  Was around 11 on previous admission. - monitor CBC - consider outpatient colonoscopy  FEN/GI: carb modified  Prophylaxis: lovenox  Disposition: medically stable, requires SNF placement  Subjective:  Patient continues to do well, though deconditioned. Medically ready for discharge, awaiting SNF placement.  An interpreter was used for this entire exam.  Objective: Temp:  [97.9 F (36.6 C)-99 F (37.2 C)] 98 F (36.7 C) (03/29 0300) Pulse Rate:  [81-100] 81 (03/29 0300) Resp:  [17-18] 17 (03/29 0300) BP: (105-121)/(63-74) 118/67 (03/29 0300) SpO2:  [93 %-98 %] 94 % (03/29 0300)  Physical Exam:  General: Resting comfortably, obese African woman in no acute distress Cardio: Regular rate and rhythm, no murmurs appreciated Lungs: Normal work of breathing, clear to auscultation bilaterally, no wheezes noted Abdomen: Soft, mild tenderness in the epigastric region but patient reports it is improved.  Bowel sounds present Skin: decreased erythema along inguinal crease R>L Extremities: no edema, compression stockings present   Laboratory: Recent Labs  Lab 09/12/19 0600 09/13/19 0252 09/14/19 0315  WBC 5.5 6.2 6.3  HGB 9.1* 10.0* 10.2*  HCT 26.6* 30.1* 31.2*  PLT 105* 120* 106*   Recent Labs  Lab 09/12/19 0600 09/13/19 0252 09/14/19 0315  NA 131* 132* 132*  K 3.4* 4.0 4.2  CL 104 104 103  CO2 20* 20* 22  BUN 8 11 14   CREATININE 0.51 0.62 0.60  CALCIUM 7.4* 7.7* 7.5*  GLUCOSE 193* 234* 200*    Imaging/Diagnostic Tests: No results found.  Gladys Damme, MD 09/14/2019, 6:45 AM PGY-1, [place] Intern pager: [phone], text pages welcome

## 2019-09-15 DIAGNOSIS — E119 Type 2 diabetes mellitus without complications: Secondary | ICD-10-CM

## 2019-09-15 LAB — GLUCOSE, CAPILLARY
Glucose-Capillary: 157 mg/dL — ABNORMAL HIGH (ref 70–99)
Glucose-Capillary: 235 mg/dL — ABNORMAL HIGH (ref 70–99)
Glucose-Capillary: 250 mg/dL — ABNORMAL HIGH (ref 70–99)
Glucose-Capillary: 173 mg/dL — ABNORMAL HIGH (ref 70–99)

## 2019-09-15 LAB — FOLATE RBC
Folate, Hemolysate: 375 ng/mL
Folate, RBC: 1246 ng/mL (ref >498)
Hematocrit: 30.1 % — ABNORMAL LOW (ref 34.0–46.6)

## 2019-09-15 MED ORDER — BISACODYL 5 MG PO TBEC: 5.0000 mg | DELAYED_RELEASE_TABLET | Freq: Every day | ORAL | 0 refills | Status: DC | PRN

## 2019-09-15 MED ORDER — HYDROCORT-PRAMOXINE (PERIANAL) 1-1 % EX FOAM: 1.0000 | Freq: Two times a day (BID) | CUTANEOUS | 0 refills | Status: DC | PRN

## 2019-09-15 MED ORDER — PANTOPRAZOLE SODIUM 40 MG PO TBEC: 40.0000 mg | DELAYED_RELEASE_TABLET | Freq: Two times a day (BID) | ORAL | 0 refills | Status: DC

## 2019-09-15 MED ORDER — SENNA 8.6 MG PO TABS
2.0000 | ORAL_TABLET | Freq: Every day | ORAL | Status: DC
  Administered 2019-09-15: 10:00:00 17.2 mg via ORAL
  Filled 2019-09-15: qty 2

## 2019-09-15 MED ORDER — HEPATITIS B VAC RECOMBINANT 20 MCG/ML IJ SUSP: 1.0000 mL | Freq: Once | INTRAMUSCULAR | Status: DC

## 2019-09-15 MED ORDER — ACETAMINOPHEN 325 MG PO TABS: 650.0000 mg | ORAL_TABLET | Freq: Four times a day (QID) | ORAL | Status: DC

## 2019-09-15 MED ORDER — METFORMIN HCL ER (OSM) 1000 MG PO TB24: 1000.0000 mg | ORAL_TABLET | Freq: Every day | ORAL | 0 refills | Status: DC

## 2019-09-15 MED ORDER — HEPATITIS B VAC RECOMBINANT 10 MCG/0.5ML IJ SUSP: 0.5000 mL | Freq: Once | INTRAMUSCULAR | Status: DC

## 2019-09-15 MED ORDER — HYDROCORT-PRAMOXINE (PERIANAL) 1-1 % EX FOAM
1.0000 | Freq: Two times a day (BID) | CUTANEOUS | Status: DC | PRN
  Administered 2019-09-15: 12:00:00 1 via RECTAL
  Filled 2019-09-15: qty 10

## 2019-09-15 MED ORDER — SENNA 8.6 MG PO TABS: 2.0000 | ORAL_TABLET | Freq: Every day | ORAL | 0 refills | Status: DC

## 2019-09-15 MED ORDER — HEPATITIS B VAC RECOMBINANT 10 MCG/ML IJ SUSP
1.0000 mL | Freq: Once | INTRAMUSCULAR | Status: AC
  Administered 2019-09-15: 15:00:00 10 ug via INTRAMUSCULAR
  Filled 2019-09-15: qty 1

## 2019-09-15 MED ORDER — PHENYLEPH-SHARK LIV OIL-MO-PET 0.25-3-14-71.9 % RE OINT: TOPICAL_OINTMENT | Freq: Two times a day (BID) | RECTAL | Status: DC | PRN

## 2019-09-15 MED ORDER — PREDNISONE 5 MG PO TABS: 5.0000 mg | ORAL_TABLET | Freq: Every day | ORAL | 0 refills | Status: DC

## 2019-09-15 MED ORDER — BISACODYL 5 MG PO TBEC: 5.0000 mg | DELAYED_RELEASE_TABLET | Freq: Every day | ORAL | Status: DC | PRN

## 2019-09-15 MED ORDER — FLUCONAZOLE 100 MG PO TABS: 100.0000 mg | ORAL_TABLET | ORAL | 0 refills | Status: DC

## 2019-09-15 MED ORDER — POLYETHYLENE GLYCOL 3350 17 G PO PACK: 17.0000 g | PACK | Freq: Two times a day (BID) | ORAL | 0 refills | Status: DC

## 2019-09-15 MED ORDER — ZINC OXIDE 40 % EX OINT: 1.0000 "application " | TOPICAL_OINTMENT | Freq: Every day | CUTANEOUS | 0 refills | Status: DC

## 2019-09-15 NOTE — Care Management (Addendum)
Left message for [facility] with [facility] , awaiting call back.   Tonya at [place] can accept today. Need discharge summary . Patient will be going to room 312. Nurse to call report today [phone] and ask for [facility].   [name] aware of above.   Archie Patten at [facility] ready for patient to come today after 2:30 will schedule  ptar for 3:00pm  and ask nurse to call report. Per Archie Patten at [facility] patient has 50 SNF days left. Patient and [name] aware.  Ronny Flurry RN

## 2019-09-15 NOTE — Discharge Instructions (Signed)
Please have patient follow up with [facility] on discharge from SNF.  Dr. Chauncey Reading has agreed to be her PCP. Follow up with Infectious Disease at [place].  1. Check her CBGs before meals. If >300, give novolog 1-2u.  2. Take ivermectin daily until 09/22/19  3. Only one more dose of fluconazole is due: 100 mg one time on 09/21/19.  4. Prednisone taper:  - 09/16/19 take 2 tablets (10mg ) daily  - 09/17/19  Take 2 tablets (10mg ) daily  - 09/18/19  Take 2 tablets (10mg ) daily  - 09/19/19  Take 1 tablet (5mg ) daily  - 09/20/19  Take 1 tablet (5mg ) daily  - 09/21/19  Take 1 tablet (5mg ) daily  - 09/22/19 STOP  5. Do Sitz baths and preparation H for your hemorrhoids.   How to Take a [employer] A sitz bath is a warm water bath that may be used to care for your rectum, genital area, or the area between your rectum and genitals (perineum). For a sitz bath, the water only comes up to your hips and covers your buttocks. A sitz bath may done at home in a bathtub or with a portable sitz bath that fits over the toilet. Your health care provider may recommend a sitz bath to help:  Relieve pain and discomfort after delivering a baby.  Relieve pain and itching from hemorrhoids or anal fissures.  Relieve pain after certain surgeries.  Relax muscles that are sore or tight. How to take a sitz bath Take 3-4 sitz baths a day, or as many as told by your health care provider. Bathtub sitz bath To take a sitz bath in a bathtub: 1. Partially fill a bathtub with warm water. The water should be deep enough to cover your hips and buttocks when you are sitting in the tub. 2. If your health care provider told you to put medicine in the water, follow his or her instructions. 3. Sit in the water. 4. Open the tub drain a little, and leave it open during your bath. 5. Turn on the warm water again, enough to replace the water that is draining out. Keep the water running throughout your bath. This helps keep the water at  the right level and the right temperature. 6. Soak in the water for 15-20 minutes, or as long as told by your health care provider. 7. When you are done, be careful when you stand up. You may feel dizzy. 8. After the sitz bath, pat yourself dry. Do not rub your skin to dry it.  Over-the-toilet sitz bath To take a sitz bath with an over-the-toilet basin: 1. Follow the manufacturer's instructions. 2. Fill the basin with warm water. 3. If your health care provider told you to put medicine in the water, follow his or her instructions. 4. Sit on the seat. Make sure the water covers your buttocks and perineum. 5. Soak in the water for 15-20 minutes, or as long as told by your health care provider. 6. After the sitz bath, pat yourself dry. Do not rub your skin to dry it. 7. Clean and dry the basin between uses. 8. Discard the basin if it cracks, or according to the manufacturer's instructions. Contact a health care provider if:  Your symptoms get worse. Do not continue with sitz baths if your symptoms get worse.  You have new symptoms. If this happens, do not continue with sitz baths until you talk with your health care provider. Summary  A sitz bath  is a warm water bath in which the water only comes up to your hips and covers your buttocks.  A sitz bath may help relieve itching, relieve pain, and relax muscles that are sore or tight in the lower part of your body, including your genital area.  Take 3-4 sitz baths a day, or as many as told by your health care provider. Soak in the water for 15-20 minutes.  Do not continue with sitz baths if your symptoms get worse. This information is not intended to replace advice given to you by your health care provider. Make sure you discuss any questions you have with your health care provider. Document Revised: 11/03/2018 Document Reviewed: 06/06/2017 Elsevier Patient Education  2020 [employer].

## 2019-09-15 NOTE — Progress Notes (Signed)
Family Medicine Teaching Service Daily Progress Note Intern Pager: [phone]  Patient name: Tracey Riddle Medical record number: [id number] Date of birth: [birth year] Age: 69 y.o. Gender: female  Primary Care Provider: System, Pcp Not In Consultants: GI Code Status: full  Pt Overview and Major Events to Date:  3/19- admitted 3/22- EGD 3/23- Strongyloides on surgical pathology  Assessment and Plan: Tracey Riddle is a 69 y.o. female presenting with intractable abdominal pain and nausea. 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 Placed orders for sitz bath, preparation H, increased senna to 2 tablets, miralax x2 per day, dulcolax added for softening. Requested nursing to wash patient and place skin barrier cream on gluteal cleft. Patient overall doing well: no emesis, tolerating regular, carb-modified diet. Surgical pathology cultured positive for strongyloides stercoralis. Continuing treatment with ivermectin per ID to 09/22/19. Fluconazole daily discontinued per ID, will continue fluconazole 100mg  qweekly for prophylaxis while on steroid taper, next due 09/21/19. GI recommends continuing IV protonix BID. Discussed this case with patient's neurologist, Dr. Shearon Stalls at [facility], will continue taper of prednisone,see myositis problem below for plan. Patient is deconditioned and requires SNF to become stronger before going home to family. SW and PT/OT consulted. OOB to chair today, will have RNs document time OOB today. She is medically ready for discharge, to discharge to [facility] today. -GI following appreciate recommendations.  -ID consulted, appreciate recommendations -Protonix 40mg  PO BID -Ivermectin day 7/14, to end 09/22/19 -Fluconazole 100 mg qweekly for ppx while  on prednisone taper (only dose 09/21/19) -Scheduled MiraLAX, senna 2 tabs, dulcolax -PT/OT eval and treat  Hypotonic Hyponatremia-resolved Na 132, corrected due to glucose is 134. Patient had strongyloides with nearly 2 weeks of emesis and diarrhea, which was likely the cause of hypotonic hyponatremia, expect this will improve as patient eats more.  Patient now tolerating carb modified diet today. Previous urine studies indicative of hypotonic hyponatremia.  Hepatitis C RUQ OZ:[id number] liver with lobulated surface, no mass or ascites. Negative gallbladder.  AST, ALT wnl. Total bili 1.7>1.5>1.3>1.0, direct= 0.3, indirect 1.2. Quant Hep C: 4.5 million. -f/u hepC genotype 4 -ID consulted, will treat in the outpatient setting -Will give Hepatitis B vaccines prior to discharge  Bacteremia- resolved Patient completed treatment for Pseudomonas and E. coli bacteremia 3/21.  Blood cultures on this admission no growth at 4 days. Urine culture positive for 60K CFU yeast. Treating esophageal candidiasis with fluconazole, see above. - levaquin (3/15-3/18) - cefepime (3/19-3/21)  DMT2 CBGs ranged from 234-200 overnight. Received 14U aspart yesterday, on prednisone 20 mg, see below for taper.  A1c 8%. Now that patient is tolerating regular meals and cr is WNL, will continue metformin 1000mg  daily. Home medications include lispro 5-10U before meals. -Plan to d/c insulin, will reassess as outpatient -Check CBGs at SNF before meals. If CBG>300, give 1-2U of novolog -continue metformin 1000mg  qd  H/o RA  Myositis Discussed case with Dr. Shearon Stalls of [facility] today. Patient had muscle biopsy that was nonspecific, greatest concern for crush injury. Dr. Shearon Stalls recommends taper. Plan today to decrease steroids to oral prednisone equivalent of 20mg  last day today, 15mg  x3d, 10mg  x3d, 5mg  x3 days and then discontinue.  Transition today to oral prednisone.  We will start at 20 mg oral Pred x3 days, 12.5 mg x 3  days, 6  mg x 3 days, and then discontinue. -Patient to follow up with Dr. Celine Mans and [facility] -Prednisone taper per pharmacy  H/O TIA Lipid panel wnl except HDL 32. Home medication lipitor 40 mg, ASA 81mg . -Restart lipitor home medication -Continue ASA 81 mg  Constipation, Hemorrhoids, MASD in gluteal cleft Patient having straining with bowel movements, hemorrhoidal pain, small amount of skin break down in gluteal cleft due to MASD. Placed orders for sitz bath, preparation H, increased senna to 2 tablets, miralax x2 per day, dulcolax added for softening. Requested nursing to wash patient and place skin barrier cream on gluteal cleft. -Miralax x2 per day -Senna 2 tabs daily -Dulcolax to help soften stool  Normocytic Anemia Hgb 10.2 yesterday and stable, was 12.9 on admission, has been receiving IV fluids, could have slight dilutional component.  MCV 96.1.  Was around 11 on previous admission. - consider outpatient colonoscopy  FEN/GI: carb modified  Prophylaxis: lovenox  Disposition: medically stable, discharging today   Subjective:  Patient straining with bowel movements, has hemorrhoids that are painful and some skin breakdown from MASD in gluteal cleft. Needs shower, sitz bath, preparation H and will increase her bowel regimen to make her more comfortable. Medically ready for discharge, will go to [facility] later today.  An interpreter was used for this entire exam.  Objective: Temp:  [98 F (36.7 C)-98.9 F (37.2 C)] 98.9 F (37.2 C) (03/30 0521) Pulse Rate:  [88-90] 90 (03/30 0521) Resp:  [15] 15 (03/30 0521) BP: (128-133)/(72-80) 128/72 (03/30 0521) SpO2:  [95 %-96 %] 95 % (03/30 0521)  Physical Exam:  General: Resting comfortably, obese African woman in no acute distress Cardio: Regular rate and rhythm, no murmurs appreciated Lungs: Normal work of breathing, clear to auscultation bilaterally, no wheezes noted Abdomen: Soft, NT, ND. Bowel sounds  present Skin: no erythema in inguinal creases, some skin break down in gluteal cleft, hemorrhoids present on anus  Extremities: no edema, compression stockings present   Laboratory: Recent Labs  Lab 09/12/19 0600 09/13/19 0252 09/14/19 0315  WBC 5.5 6.2 6.3  HGB 9.1* 10.0* 10.2*  HCT 26.6* 30.1* 31.2*  PLT 105* 120* 106*   Recent Labs  Lab 09/12/19 0600 09/13/19 0252 09/14/19 0315  NA 131* 132* 132*  K 3.4* 4.0 4.2  CL 104 104 103  CO2 20* 20* 22  BUN 8 11 14   CREATININE 0.51 0.62 0.60  CALCIUM 7.4* 7.7* 7.5*  GLUCOSE 193* 234* 200*    Imaging/Diagnostic Tests: No results found.  09/16/19, MD 09/15/2019, 7:52 AM PGY-1, [facility] Family Medicine FPTS Intern pager: [phone], text pages welcome

## 2019-09-15 NOTE — Progress Notes (Signed)
Report given to [facility], RN of [employer]

## 2019-09-16 ENCOUNTER — Non-Acute Institutional Stay (SKILLED_NURSING_FACILITY): Payer: Medicare Other | Admitting: Adult Health

## 2019-09-16 ENCOUNTER — Encounter: Payer: Self-pay | Admitting: Adult Health

## 2019-09-16 DIAGNOSIS — R7881 Bacteremia: Secondary | ICD-10-CM

## 2019-09-16 DIAGNOSIS — R1084 Generalized abdominal pain: Secondary | ICD-10-CM

## 2019-09-16 DIAGNOSIS — B9620 Unspecified Escherichia coli [E. coli] as the cause of diseases classified elsewhere: Secondary | ICD-10-CM

## 2019-09-16 DIAGNOSIS — B780 Intestinal strongyloidiasis: Secondary | ICD-10-CM | POA: Diagnosis not present

## 2019-09-16 DIAGNOSIS — B3781 Candidal esophagitis: Secondary | ICD-10-CM

## 2019-09-16 DIAGNOSIS — B182 Chronic viral hepatitis C: Secondary | ICD-10-CM

## 2019-09-16 DIAGNOSIS — D649 Anemia, unspecified: Secondary | ICD-10-CM

## 2019-09-16 DIAGNOSIS — E1169 Type 2 diabetes mellitus with other specified complication: Secondary | ICD-10-CM

## 2019-09-16 DIAGNOSIS — E778 Other disorders of glycoprotein metabolism: Secondary | ICD-10-CM

## 2019-09-16 DIAGNOSIS — M609 Myositis, unspecified: Secondary | ICD-10-CM

## 2019-09-16 NOTE — Progress Notes (Signed)
Pt discharge to [facility] via [facility]. VSS. Discharge summary given to escort personnel.

## 2019-09-16 NOTE — Progress Notes (Addendum)
Location:  [place] Room Number: 315-A Place of Service:  SNF (31) Provider:  Durenda Age, DNP, FNP-BC  Patient Care Team: System, Pcp Not In as PCP - General  Extended Emergency Contact Information Primary Emergency Contact: Rogue Jury Mobile Phone: [phone] Relation: Niece Secondary Emergency Contact: Uwimana,Emma Mobile Phone: [phone] Relation: Niece  Code Status:  Full Code  Goals of care: Advanced Directive information Advanced Directives 09/07/2019  Does Patient Have a Medical Advance Directive? No  Would patient like information on creating a medical advance directive? No - Patient declined     Chief Complaint  Patient presents with  . Acute Visit    Patient is seen for hospital followup, status post admission at [facility] for intestinal strongyloidiasis.     HPI:  Pt is a 69 y.o. female who was admitted to [place] on 09/15/19 post hospitalization 09/03/19 to 09/15/19. She has a PMH of diabetes mellitus type 2, history of TIA, reported history of rheumatoid arthritis and myositis.  She presented to the hospital with 4 days of nausea, vomiting and abdominal pain.  She was recently admitted to the hospital with similar presentations and was found to have pansensitive E. coli and Pseudomonas bacteremia as well as ESBL E. coli UTI and rotavirus.  She was treated with meropenem IV and switched to Levaquin for 4 total of 7 days treatment and discharge on 3/14.  She was started on IV Protonix but continued to have pain and vomiting.  CT abdomen and pelvis showed no cause for the patient's abdominal pain or vomiting.  GI was consulted and recommended adding Dulcolax suppositories, continuing PPI, starting sucralfate.  EGD was performed on 3/22 which showed moderate gastritis, severe duodenitis, and esophageal candidiasis.  Biopsy taken were cultured and grew strongyloides stercoralis.  She was placed on continuous  IV PPI for 2 days, fluconazole 400 mg IV started x1 day.  Infectious disease consulted and patient treated with a total of 1 week fluconazole 200 mg p.o. and total of 2 weeks on ivermectin p.o., to end 4/06.  ID also recommended for patient to remain on fluconazole 100 mg and will follow up with GI.   She was seen in the room today. She does not speak [place] but staff who speaks Alveta Heimlich was able to translate. She was noted to have vomited undigested food and complained of abdominal pain.   Past Medical History:  Diagnosis Date  . 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  . Diabetes mellitus type II, controlled ([place]) 08/27/2019   Past Surgical History:  Procedure Laterality Date  . BIOPSY  09/07/2019   Procedure: BIOPSY;  Surgeon: Wilford Corner, MD;  Location: [place];  Service: Endoscopy;;  . ESOPHAGEAL BRUSHING  09/07/2019   Procedure: ESOPHAGEAL BRUSHING;  Surgeon: Wilford Corner, MD;  Location: [place];  Service: Endoscopy;;  . ESOPHAGOGASTRODUODENOSCOPY (EGD) WITH PROPOFOL Left 09/07/2019   Procedure: ESOPHAGOGASTRODUODENOSCOPY (EGD) WITH PROPOFOL;  Surgeon: Wilford Corner, MD;  Location: [place];  Service: Endoscopy;  Laterality: Left;    No Known Allergies  Outpatient Encounter Medications as of 09/16/2019  Medication Sig  . acetaminophen (TYLENOL) 325 MG tablet Take 650 mg by mouth every 6 (six) hours.  Marland Kitchen aspirin EC 81 MG EC tablet Take 1 tablet (81 mg total) by mouth daily.  Marland Kitchen atorvastatin (LIPITOR) 40 MG tablet Take 1 tablet (40 mg total) by mouth daily at 6 PM.  .  bisacodyl (DULCOLAX) 5 MG EC tablet Take 1 tablet (5 mg total) by mouth daily as needed for moderate constipation.  . hydrocortisone-pramoxine (PROCTOFOAM-HC) rectal foam Place 1 applicator rectally 2 (two) times daily as needed for hemorrhoids.  Marland Kitchen ivermectin (STROMECTOL) 3 MG TABS tablet Take 6 tablets (18,000  mcg total) by mouth daily for 12 doses.  Marland Kitchen liver oil-zinc oxide (DESITIN) 40 % ointment Apply 1 application topically daily.  . metFORMIN (FORTAMET) 1000 MG (OSM) 24 hr tablet Take 1 tablet (1,000 mg total) by mouth daily with breakfast.  . pantoprazole (PROTONIX) 40 MG tablet Take 1 tablet (40 mg total) by mouth 2 (two) times daily.  . polyethylene glycol (MIRALAX / GLYCOLAX) 17 g packet Take 17 g by mouth 2 (two) times daily.  . predniSONE (DELTASONE) 5 MG tablet Take 1 tablet (5 mg total) by mouth daily with breakfast. Take 2 tablets by mouth daily from 3/31- 4/2, followed by 1 tablet by mouth daily from 4/3-4/6  . senna (SENOKOT) 8.6 MG TABS tablet Take 2 tablets (17.2 mg total) by mouth daily.  . blood glucose meter kit and supplies KIT Dispense based on patient and insurance preference. Use up to four times daily as directed. (FOR ICD-9 250.00, 250.01). (Patient not taking: Reported on 09/16/2019)  . [DISCONTINUED] acetaminophen (TYLENOL) 325 MG tablet Take 2 tablets (650 mg total) by mouth every 6 (six) hours.  . [DISCONTINUED] fluconazole (DIFLUCAN) 100 MG tablet Take 1 tablet (100 mg total) by mouth once a week. Take only on 09/21/19   No facility-administered encounter medications on file as of 09/16/2019.    Review of Systems  Unable to obtain since she doesn't speak [place]   Immunization History  Administered Date(s) Administered  . Hepatitis B, adult 09/15/2019  . Influenza, High Dose Seasonal PF 03/28/2019  . Pneumococcal Polysaccharide-23 04/17/2018   Pertinent  Health Maintenance Due  Topic Date Due  . FOOT EXAM  Never done  . OPHTHALMOLOGY EXAM  Never done  . URINE MICROALBUMIN  Never done  . MAMMOGRAM  Never done  . COLONOSCOPY  Never done  . DEXA SCAN  Never done  . PNA vac Low Risk Adult (2 of 2 - PCV13) 04/18/2019  . HEMOGLOBIN A1C  02/26/2020  . INFLUENZA VACCINE  Completed    Vitals:   09/16/19 0920  BP: 105/61  Pulse: 77  Resp: 18  Temp: 97.7 F (36.5 C)   TempSrc: Oral  Weight: 191 lb 9.3 oz (86.9 kg)  Height: 5' 3"  (1.6 m)   Body mass index is 33.94 kg/m.  Physical Exam  GENERAL APPEARANCE: Well nourished. In no acute distress. Obese SKIN:  Sacral wound, stage 2 . MOUTH and THROAT: Lips are without lesions. Oral mucosa is moist and without lesions. Tongue is normal in shape, size, and color and without lesions RESPIRATORY: Breathing is even & unlabored, BS CTAB CARDIAC: RRR, no murmur,no extra heart sounds, no edema GI: Abdomen soft, normal BS, no masses, no tenderness EXTREMITIES:  Able to move X 4 extremities NEUROLOGICAL: There is no tremor. Speech is clear. PSYCHIATRIC:  Affect and behavior are appropriate  Labs reviewed: Recent Labs    08/26/19 0934 08/27/19 0533 09/07/19 0411 09/07/19 0411 09/08/19 0259 09/09/19 0422 09/12/19 0600 09/13/19 0252 09/14/19 0315  NA  --    < > 129*   < > 131*   < > 131* 132* 132*  K  --    < > 3.3*   < > 3.4*   < >  3.4* 4.0 4.2  CL  --    < > 103   < > 109   < > 104 104 103  CO2  --    < > 18*   < > 18*   < > 20* 20* 22  GLUCOSE  --    < > 144*   < > 68*   < > 193* 234* 200*  BUN  --    < > 6*   < > 5*   < > 8 11 14   CREATININE  --    < > 0.51   < > 0.52   < > 0.51 0.62 0.60  CALCIUM  --    < > 7.1*   < > 6.5*   < > 7.4* 7.7* 7.5*  MG 1.9  --  1.6*  --  1.8  --   --   --   --    < > = values in this interval not displayed.   Recent Labs    09/04/19 0026 09/04/19 0026 09/04/19 0759 09/05/19 0328 09/06/19 0751  AST 33  --   --  24 31  ALT 37  --   --  28 29  ALKPHOS 69  --   --  52 57  BILITOT 1.7*   < > 1.5* 1.3* 1.0  PROT 5.8*  --   --  4.6* 4.5*  ALBUMIN 2.4*  --   --  1.8* 1.8*   < > = values in this interval not displayed.   Recent Labs    08/26/19 0836 08/27/19 0533 09/12/19 0600 09/12/19 0600 09/13/19 0252 09/14/19 0315  WBC 8.8   < > 5.5  --  6.2 6.3  NEUTROABS 5.8  --   --   --   --   --   HGB 14.2   < > 9.1*  --  10.0* 10.2*  HCT 41.6   < > 26.6*   < >  30.1* 31.2*  30.1*  MCV 98.6   < > 96.0  --  99.3 98.7  PLT 164   < > 105*  --  120* 106*   < > = values in this interval not displayed.   Lab Results  Component Value Date   TSH 0.694 09/04/2019   Lab Results  Component Value Date   HGBA1C 8.0 (H) 08/26/2019   Lab Results  Component Value Date   CHOL 95 08/26/2019   HDL 32 (L) 08/26/2019   LDLCALC 44 08/26/2019   TRIG 95 08/26/2019   CHOLHDL 3.0 08/26/2019    Significant Diagnostic Results in last 30 days:  CT ABDOMEN PELVIS W CONTRAST  Result Date: 09/04/2019 CLINICAL DATA:  Abdominal pain and vomiting EXAM: CT ABDOMEN AND PELVIS WITH CONTRAST TECHNIQUE: Multidetector CT imaging of the abdomen and pelvis was performed using the standard protocol following bolus administration of intravenous contrast. CONTRAST:  177m OMNIPAQUE IOHEXOL 300 MG/ML  SOLN COMPARISON:  Multiple exams, including 08/26/2019 and abdominal ultrasound of 09/04/2019 FINDINGS: Lower chest: Dependent atelectasis in both lungs. Atherosclerotic calcification of portions of the thoracic aorta. Mild cardiomegaly. Hepatobiliary: Hepatic morphology favors cirrhosis. No appreciable enhancing hepatic mass. Contracted gallbladder. No significant biliary dilatation. Pancreas: Unremarkable Spleen: Unremarkable Adrenals/Urinary Tract: 1.0 cm hypodense lesion of the left kidney upper pole is stable and likely a cyst although technically too small to characterize. The adrenal glands appear normal. Urinary bladder unremarkable. Stomach/Bowel: No dilated bowel. Normal appendix. Moderate prominence of fluid and food material in the  stomach although this may be coincidental. Vascular/Lymphatic: Aortoiliac atherosclerotic vascular disease. No pathologic adenopathy observed. Reproductive: Unremarkable Other: No supplemental non-categorized findings. Musculoskeletal: Mild sclerosis along the iliac side of both sacroiliac joints, unchanged. Mild grade 1 degenerative retrolisthesis at L5-S1,  with facet arthropathy potentially causing mild left foraminal stenosis at this level. Disc bulge at L4-5. Minimal anterior wedging at T5 and T6, likely chronic. IMPRESSION: 1. A specific cause for the patient's abdominal pain and vomiting is not identified. 2. Hepatic morphology favors cirrhosis. 3. Other imaging findings of potential clinical significance: Mild cardiomegaly. Mild sclerosis along the iliac side of both sacroiliac joints, possibly from mild chronic sacroiliitis. Disc bulge at L4-5. Mild left foraminal stenosis at L5-S1 due to facet arthropathy. Minimal anterior wedging at T5 and T6, likely chronic. Aortic Atherosclerosis (ICD10-I70.0). Electronically Signed   By: Van Clines M.D.   On: 09/04/2019 11:29   CT ABDOMEN PELVIS W CONTRAST  Result Date: 08/26/2019 CLINICAL DATA:  Lower abdominal pain, nausea/vomiting EXAM: CT ABDOMEN AND PELVIS WITH CONTRAST TECHNIQUE: Multidetector CT imaging of the abdomen and pelvis was performed using the standard protocol following bolus administration of intravenous contrast. CONTRAST:  179m OMNIPAQUE IOHEXOL 300 MG/ML  SOLN COMPARISON:  None. FINDINGS: Motion degraded images. Lower chest: Mild dependent atelectasis in the bilateral lower lobes. Hepatobiliary: Mildly nodular hepatic contour, raising the possibility of mild cirrhosis. No suspicious/enhancing hepatic lesions. Gallbladder is unremarkable. No intrahepatic or extrahepatic ductal dilatation. Pancreas: Within normal limits. Spleen: The normal limits. Adrenals/Urinary Tract: Adrenal glands are within normal limits. 10 mm left upper pole renal cyst (series 3/image 26). Right kidney is within normal limits. No hydronephrosis. Bladder is within normal limits. Stomach/Bowel: Stomach is within normal limits. No evidence of bowel obstruction. Normal appendix (series 3/image 50). No colonic wall thickening or inflammatory changes. Vascular/Lymphatic: No evidence of abdominal aortic aneurysm.  Atherosclerotic calcifications of the abdominal aorta and branch vessels. No suspicious abdominopelvic lymphadenopathy. Reproductive: Uterus is within normal limits. Left ovary is within normal limits.  No right adnexal mass. Other: No abdominopelvic ascites. Musculoskeletal: Mild degenerative changes at L5-S1. IMPRESSION: Motion degraded images. No evidence of bowel obstruction.  Normal appendix. No CT findings to account for the patient's lower abdominal pain. Mildly nodular hepatic contour, raising the possibility of mild cirrhosis. Electronically Signed   By: SJulian HyM.D.   On: 08/26/2019 11:40   UKoreaAbdomen Limited RUQ  Result Date: 09/04/2019 CLINICAL DATA:  Hepatitis-C EXAM: ULTRASOUND ABDOMEN LIMITED RIGHT UPPER QUADRANT COMPARISON:  Abdominal CT from 9 days ago FINDINGS: Gallbladder: No gallstones or wall thickening visualized. No sonographic Murphy sign noted by sonographer. Common bile duct: Diameter: 5 mm Liver: Echogenic liver with lobulated surface. There is fissure widening and caudate lobe hypertrophy on prior CT. No evident mass lesion, but limited by the degree of acoustic penetration. Portal vein is patent on color Doppler imaging with normal direction of blood flow towards the liver. IMPRESSION: 1. Cirrhotic liver without visible mass or ascites. 2. Negative gallbladder. Electronically Signed   By: JMonte FantasiaM.D.   On: 09/04/2019 04:37    Assessment/Plan  1. Generalized abdominal pain - CT abdomen and pelvis showed no cause for the patient's abdominal pain or vomiting.  GI was consulted and recommended adding Dulcolax suppositories, continuing PPI, starting sucralfate.  EGD was performed on 3/22 which showed moderate gastritis, severe duodenitis, and esophageal candidiasis.  Biopsy taken were cultured and grew strongyloides stercoralis.  She was placed on continuous IV PPI for 2 days, fluconazole  400 mg IV started x1 day.  Infectious disease consulted and was treated with  total of 1 week of fluconazole 200 mg p.o., and for a total of 2 weeks Ivermectin p.o., to end 4/6. ID also recommended for patient to remain on fluconazole 100 mg weekly starting on 4 5/21 - will start on Tramadol 50 mg Q 8 hours PRN for pain X 24 hours  2. Intestinal strongyloidiasis - EGD was performed on 3/22, biopsy taken were cultured and grew strongyloides stercoralis. - continue ivermectin for a total of 2 weeks, to end 4/6 -Follow-up with Infectious Disease at [facility] on 10/01/19 at 4PM  3. Esophageal candidiasis ([place]) - EGD  performed on 3/22 showed moderate gastritis, severe duodenitis and esophageal candidiasis -She was placed on continuous IV PPI for 2 days.  Infectious disease consulted and was treated with total of 1 week of fluconazole 200 mg p.o. then Fluconazole 100 mg weekly starting on 09/21/19, while on Prednisone taper  4. Chronic hepatitis C without hepatic coma (HCC) -Quant hep C was 4.5 million, genotype 4, from do not have immunity to hep B, hep B vaccine was administered before discharge from the hospital -[facility] with [facility]  5. Hypoproteinemia (HCC) -Thought to be due to persistent nausea/vomiting/GI losses, Na was as low as 123, was given IV fluids and Na at discharge from hospital was 134 -BMP in 1 week  6. 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 Shearon Stalls, her neurologist from [facility] and was okay to taper -Prednisone 5 mg 2 tabs = 10 mg daily daily from 3/31-4/2 followed by 1 tab daily from 4/3-4/6 -Follow-up with neurology  7. Type 2 diabetes mellitus with other specified complication, without long-term current use of insulin (HCC) Lab Results  Component Value Date   HGBA1C 8.0 (H) 08/26/2019   -Continue Metformin 1000 mg 1 tab daily, CBG daily  8. Bacteremia due to Escherichia  coli -Noted to have Pseudomonas and E. coli bacteremia during her previous hospitalization.  She was due to continue on Levaquin p.o. until 3/21 but was unable to tolerate p.o., she was treated with cefepime while in the hospital and completed a full treatment on 3/21.  Repeat blood cultures obtained on hospital admission showed no growth -Of note, clinical presentation was explainable by Strongyloidiasis, which is closely associated with polymicrobial GNR bacteremia when the larvae exit the GI tract for the bloodstream   9.  Normocytic anemia Lab Results  Component Value Date   HGB 10.2 (L) 09/14/2019   -Will refer to GI for colonoscopy      Family/ staff Communication:  Discussed plan of care with resident.  Labs/tests ordered:  CBC and BMP in 1 week  Goals of care:  Short-term care   Durenda Age, DNP, FNP-BC [facility] and Adult Medicine [phone] (Monday-Friday 8:00 a.m. - 5:00 p.m.) [phone] (after hours)

## 2019-09-17 ENCOUNTER — Non-Acute Institutional Stay (SKILLED_NURSING_FACILITY): Payer: Medicare Other | Admitting: Internal Medicine

## 2019-09-17 ENCOUNTER — Other Ambulatory Visit: Payer: Self-pay | Admitting: Adult Health

## 2019-09-17 ENCOUNTER — Encounter: Payer: Self-pay | Admitting: Internal Medicine

## 2019-09-17 DIAGNOSIS — E871 Hypo-osmolality and hyponatremia: Secondary | ICD-10-CM | POA: Diagnosis not present

## 2019-09-17 DIAGNOSIS — B780 Intestinal strongyloidiasis: Secondary | ICD-10-CM | POA: Diagnosis not present

## 2019-09-17 DIAGNOSIS — E119 Type 2 diabetes mellitus without complications: Secondary | ICD-10-CM

## 2019-09-17 DIAGNOSIS — B3781 Candidal esophagitis: Secondary | ICD-10-CM | POA: Diagnosis not present

## 2019-09-17 DIAGNOSIS — B182 Chronic viral hepatitis C: Secondary | ICD-10-CM

## 2019-09-17 DIAGNOSIS — B9620 Unspecified Escherichia coli [E. coli] as the cause of diseases classified elsewhere: Secondary | ICD-10-CM

## 2019-09-17 DIAGNOSIS — R7881 Bacteremia: Secondary | ICD-10-CM | POA: Diagnosis not present

## 2019-09-17 MED ORDER — TRAMADOL HCL 50 MG PO TABS: 50.0000 mg | ORAL_TABLET | Freq: Three times a day (TID) | ORAL | 0 refills | Status: DC | PRN

## 2019-09-17 NOTE — Assessment & Plan Note (Signed)
Due to language barriers it is difficult to assess; but clinically she appears improved compared to the complex history.

## 2019-09-17 NOTE — Progress Notes (Signed)
NURSING HOME LOCATION:  [facility] ROOM NUMBER:  315-A  CODE STATUS:  Full Code  PCP:  System, Pcp Not In   This is a comprehensive admission note to [facility] performed on this date less than 30 days from date of admission. Included are preadmission medical/surgical history; reconciled medication list; family history; social history and comprehensive review of systems.  Corrections and additions to the records were documented. Comprehensive physical exam was also performed. Additionally a clinical summary was entered for each active diagnosis pertinent to this admission in the Problem List to enhance continuity of care.  HPI: The patient was hospitalized 3/18-3/30/2021 for dehydration secondary to diarrhea and vomiting in the context of Strongyloides infection.  She presented with nausea, vomiting, and abdominal pain.  IV Protonix was initiated.  CT of the abdomen/pelvis was unrevealing as to etiology of the pain but did show mildly nodular changes suggesting cirrhotic process.  GI recommended adding Dulcolax suppositories and continuing the parenteral PPI.  Additionally sulcalfate was initiated.  EGD was performed 3/22 by Dr. Bosie Riddle revealing moderate gastritis, severe duodenitis and esophageal candidiasis.  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  Continuation of IV PPI was prescribed x2 days and IV fluconazole 400 mg x 1 day.  ID consulted and recommended oral fluconazole 200 mg daily x1 week and 2 weeks of ivermectin p.o., ending 4/6.  ID also recommended that she remain on fluconazole 100 mg daily. The persistent nausea and vomiting and diarrhea was associated with hypotonic hyponatremia with admission sodium of 123.  This was corrected with IV fluids and oral fluids and oral nutrition.  At discharge sodium was 134. Past medical history includes hepatitis C.  Reflex quant could not be performed due to stable size. Repeat quant hep C was 4.5  million, genotype 4.  The patient was found to have no immunity to hepatitis B and hepatitis B vaccine was administered prior to discharge. During a prior hospitalization she was also found to have Pseudomonas and E. coli bacteremia.  She was continued on Levaquin orally until 3/21.  As she was unable to tolerate the oral Levaquin she was placed on cefepime while an inpatient.  She completed full treatment as of 3/21.  Repeat blood cultures on admission revealed no growth.  The presentation was attributed to the Strongyloidiasis which is closely associated with polymicrobial gram-negative rod bacteremia when the larvae exit the GI tract into the bloodstream. 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  Dr. Celine Riddle, patient's neurologist from [facility] recommended tapering the drug.  The taper was to continue until/6/21.  Social history: She is from [place].  She speaks essentially no [place].  Family history: Apparently she lost 5 children in the [place] genocide.   Review of systems: Fasting glucoses here at the SNF have ranged from 155-186 and evening glucoses 176-188.  She is on 1000 mg of Metformin with breakfast.  Review of systems could  not be completed due to language barriers.  She speaks in a whisper and stated "no speak English".  She did seem to indicate she was having some right lower quadrant abdominal discomfort.  This could not be qualified or quantitated due to the communication issues. Her CNA reports wounds in the right hip and buttocks areas.  Wound care nurse will be assessing these.  Physical exam:  Pertinent or positive findings: She appears surprisingly well nourished in view of the complex history.  She  is actually obese.  She appears much younger than her stated age.  Slight arcus senilis is suggested.  Teeth are coated and the oral tissues are slightly dry.  Breath sounds are decreased.  Slight tach was noted.  Bowel  sounds are active.  There was no tenderness to palpation clinically.  She has one half-1+ edema of the lower extremities.  Strength could not be tested as she would not oppose my hand with her extremities.  She had scattered resolving ecchymoses of the extremities.  General appearance: no acute distress, increased work of breathing is present.   Lymphatic: No lymphadenopathy about the head, neck, axilla. Eyes: No conjunctival inflammation or lid edema is present. There is no scleral icterus. Ears:  External ear exam shows no significant lesions or deformities.   Nose:  External nasal examination shows no deformity or inflammation.  Oral exam: Lips and gums are healthy appearing. Neck:  No thyromegaly, masses, tenderness noted.    Heart:  No murmur, click, rub.  Lungs: without wheezes, rhonchi, rales, rubs. Abdomen: no organomegaly, hernias, masses. GU: Deferred  Extremities:  No cyanosis, clubbing. Neurologic exam: Balance, Rhomberg, finger to nose testing could not be completed due to clinical state Skin: Warm & dry w/o tenting. No significant rash.  See clinical summary under each active problem in the Problem List with associated updated therapeutic plan

## 2019-09-17 NOTE — Assessment & Plan Note (Addendum)
  At SNF FBS 135-186 and evening glucoses 176-188 on 1000 mg Metformin with breakfast.  This will be changed to after the evening meal to minimize GI issues. Basal insulin will be initiated if glucoses are consistently above 200.

## 2019-09-17 NOTE — Patient Instructions (Signed)
See assessment and plan under each diagnosis in the problem list and acutely for this visit 

## 2019-09-23 ENCOUNTER — Inpatient Hospital Stay: Payer: Medicare Other | Admitting: Family Medicine

## 2019-09-23 LAB — BASIC METABOLIC PANEL
BUN: 9 (ref 4–21)
CO2: 21 (ref 13–22)
Chloride: 99 (ref 99–108)
Creatinine: 0.4 — AB (ref 0.5–1.1)
Glucose: 119
Potassium: 4.3 (ref 3.4–5.3)
Sodium: 134 — AB (ref 137–147)

## 2019-09-23 LAB — CBC: RBC: 3.34 — AB (ref 3.87–5.11)

## 2019-09-23 LAB — CBC AND DIFFERENTIAL
HCT: 32 — AB (ref 36–46)
Hemoglobin: 10.8 — AB (ref 12.0–16.0)
Neutrophils Absolute: 2
Platelets: 214 (ref 150–399)
WBC: 5.1

## 2019-09-23 LAB — COMPREHENSIVE METABOLIC PANEL
Calcium: 8.3 — AB (ref 8.7–10.7)
GFR calc Af Amer: 90
GFR calc non Af Amer: 90

## 2019-10-01 ENCOUNTER — Ambulatory Visit (INDEPENDENT_AMBULATORY_CARE_PROVIDER_SITE_OTHER): Payer: Medicare Other | Admitting: Infectious Diseases

## 2019-10-01 ENCOUNTER — Other Ambulatory Visit: Payer: Self-pay

## 2019-10-01 ENCOUNTER — Encounter: Payer: Self-pay | Admitting: Infectious Diseases

## 2019-10-01 ENCOUNTER — Non-Acute Institutional Stay (SKILLED_NURSING_FACILITY): Payer: Medicare Other | Admitting: Adult Health

## 2019-10-01 ENCOUNTER — Encounter: Payer: Self-pay | Admitting: Adult Health

## 2019-10-01 DIAGNOSIS — B780 Intestinal strongyloidiasis: Secondary | ICD-10-CM

## 2019-10-01 DIAGNOSIS — R197 Diarrhea, unspecified: Secondary | ICD-10-CM

## 2019-10-01 DIAGNOSIS — B182 Chronic viral hepatitis C: Secondary | ICD-10-CM | POA: Diagnosis not present

## 2019-10-01 DIAGNOSIS — B3781 Candidal esophagitis: Secondary | ICD-10-CM | POA: Diagnosis not present

## 2019-10-01 DIAGNOSIS — K521 Toxic gastroenteritis and colitis: Secondary | ICD-10-CM

## 2019-10-01 DIAGNOSIS — Z7952 Long term (current) use of systemic steroids: Secondary | ICD-10-CM

## 2019-10-01 DIAGNOSIS — R1084 Generalized abdominal pain: Secondary | ICD-10-CM

## 2019-10-01 NOTE — Progress Notes (Signed)
Bathroom off and on but when she has a bad day she finds that she needs to use the bathroom frequently at times and often back and forth. Sometimes she goes 4 times   May 6th with [facility].   Odynophagia        Patient: Tracey Riddle  DOB: [birth year] MRN: [id number] PCP: System, Pcp Not In     Patient Active Problem List   Diagnosis Date Noted  . 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 Chronic viral hepatitis C ([place]) 09/08/2019    Priority: Medium  . Non-intractable vomiting   . Diarrhea 08/27/2019  . Hyponatremia 08/27/2019  . Pyuria 08/27/2019  . Abnormal liver CT 08/27/2019  . Diabetes mellitus type II, controlled ([place]) 08/27/2019  . Hypoproteinemia ([place]) 08/27/2019  . Thrombocytopenia ([place]) 08/27/2019     Subjective:  Tracey Riddle is a 69 y.o. female here for hospital follow up on strongyloidiasis gastritis and chronic Hepatitis C, Genotype 4. IN person interpretor available during the visit today.   She was admitted 3/10 with polymicrobial gram negative rod sepsis s/p treatment with IV Merrem --> Levaquin. Repeat admission 3/19 with nausea, vomiting and epigastric pains. EGD biopsy revealed significant edema/erythema to gastric mucosa that was (+) for strongyloidiasis infection and secondary esophageal candidiasis infection. She was given a course of Ivermectin and prolonged fluconazole with slow improvement of symptoms. She was discharged to SNF on 3/30. She spends a large majority of time in wheelchair d/t weakness. Requires assist x 2.   She was initially having a primary problem with constipation in the hospital when we saw her; since discharge she now is troubled with diarrhea and incontinence with high frequency of stools. She describes it to be liquid and feels she is overburdening the staff to keep up with her movements. She is getting BID Miralax and Colace from SNF. She has good appetite but has noticed some weight loss (confirmed by SNF staff  representative who cares for her today). She does have some intermittent upper abdominal pain but notices it improves if she does not eat breakfast first thing in the morning.  Continues on BID Protonix --> follow up with [facility] scheduled soon.   Has questions about her hepatitis infection.    Review of Systems  Constitutional: Negative for chills, fever, malaise/fatigue and weight loss.  HENT: Negative for sore throat.        No dental problems  Respiratory: Negative for cough and sputum production.   Cardiovascular: Negative for chest pain and leg swelling.  Gastrointestinal: Positive for abdominal pain, diarrhea and nausea. Negative for vomiting.  Genitourinary: Negative for dysuria and flank pain.  Musculoskeletal: Negative for joint pain, myalgias and neck pain.  Skin: Negative for rash.  Neurological: Negative for dizziness, tingling and headaches.  Psychiatric/Behavioral: Negative for depression and substance abuse. The patient is not nervous/anxious and does not have insomnia.     Past Medical History:  Diagnosis Date  . 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  . Diabetes mellitus type II, controlled ([place]) 08/27/2019  . Hepatitis C     Outpatient Medications Prior to Visit  Medication Sig Dispense Refill  . atorvastatin (LIPITOR) 40 MG tablet Take 1 tablet (40 mg total) by mouth daily at 6 PM. 30 tablet 0  . bisacodyl (DULCOLAX) 5 MG EC tablet Take 1 tablet (5 mg total) by mouth daily as needed for moderate constipation.  30 tablet 0  . blood glucose meter kit and supplies KIT Dispense based on patient and insurance preference. Use up to four times daily as directed. (FOR ICD-9 250.00, 250.01). (Patient not taking: Reported on 10/01/2019) 1 each 0  . hydrocortisone-pramoxine (PROCTOFOAM-HC) rectal foam Place 1 applicator rectally 2 (two) times daily as needed for hemorrhoids. 10 g 0  . liver  oil-zinc oxide (DESITIN) 40 % ointment Apply 1 application topically daily. 56.7 g 0  . metFORMIN (FORTAMET) 1000 MG (OSM) 24 hr tablet Take 1 tablet (1,000 mg total) by mouth daily with breakfast. 30 tablet 0  . pantoprazole (PROTONIX) 40 MG tablet Take 1 tablet (40 mg total) by mouth 2 (two) times daily. 60 tablet 0  . polyethylene glycol (MIRALAX / GLYCOLAX) 17 g packet Take 17 g by mouth 2 (two) times daily. 14 each 0  . traMADol (ULTRAM) 50 MG tablet Take 1 tablet (50 mg total) by mouth every 8 (eight) hours as needed. 30 tablet 0  . acetaminophen (TYLENOL) 325 MG tablet Take 650 mg by mouth every 6 (six) hours.    Marland Kitchen aspirin EC 81 MG EC tablet Take 1 tablet (81 mg total) by mouth daily. 30 tablet 0  . senna (SENOKOT) 8.6 MG TABS tablet Take 2 tablets (17.2 mg total) by mouth daily. 120 tablet 0  . predniSONE (DELTASONE) 5 MG tablet Take 1 tablet (5 mg total) by mouth daily with breakfast. Take 2 tablets by mouth daily from 3/31- 4/2, followed by 1 tablet by mouth daily from 4/3-4/6 9 tablet 0   No facility-administered medications prior to visit.     No Known Allergies  Social History   Tobacco Use  . Smoking status: Never Smoker  . Smokeless tobacco: Never Used  Substance Use Topics  . Alcohol use: Not on file  . Drug use: Never    History reviewed. No pertinent family history.  Objective:   Vitals:   10/01/19 1542  BP: 129/74  Pulse: (!) 114  Temp: 98.6 F (37 C)  SpO2: 98%   There is no height or weight on file to calculate BMI.  Physical Exam Constitutional:      Appearance: Normal appearance.     Comments: Younger than stated age. Appears fatigued today. Comfortable appearing in wheelchair.   HENT:     Mouth/Throat:     Mouth: Mucous membranes are moist.     Pharynx: Oropharynx is clear. No oropharyngeal exudate.  Eyes:     General: No scleral icterus. Cardiovascular:     Rate and Rhythm: Normal rate and regular rhythm.     Pulses: Normal pulses.      Heart sounds: No murmur.  Pulmonary:     Effort: Pulmonary effort is normal. No respiratory distress.     Breath sounds: Normal breath sounds. No rhonchi.  Abdominal:     General: Bowel sounds are normal. There is no distension.     Palpations: Abdomen is soft.     Tenderness: There is no abdominal tenderness.  Skin:    General: Skin is warm and dry.  Neurological:     Mental Status: She is alert and oriented to person, place, and time.     Lab Results: Lab Results  Component Value Date   WBC 5.1 09/23/2019   HGB 10.8 (A) 09/23/2019   HCT 32 (A) 09/23/2019   MCV 98.7 09/14/2019   PLT 214 09/23/2019    Lab Results  Component Value Date   CREATININE 0.4 (A)  09/23/2019   BUN 9 09/23/2019   NA 134 (A) 09/23/2019   K 4.3 09/23/2019   CL 99 09/23/2019   CO2 21 09/23/2019    Lab Results  Component Value Date   ALT 29 09/06/2019   AST 31 09/06/2019   ALKPHOS 57 09/06/2019   BILITOT 1.0 09/06/2019     Assessment & Plan:   Problem List Items Addressed This Visit      High   Intestinal strongyloidiasis    She completed 2 weeks of ivermectin for hyperinfection with last dose given on 4/06. She seems to have improved following treatment but now troubled by diarrhea. See below.  Will ask SNF to obtain stool O&P.  Repeat strongyloides serum antibody in 41m  FU in 3 months.         Medium   Chronic viral hepatitis C ([place]    Possible cirrhosis identified on CT but no signs of decompensation. Genotype 4, treatment naive. She has FU with GI team for liver care - no findings that suggest hepatic gastropathy or portal hypertension on EGD. Normal platelets and INR suggesting intact liver function. She is certainly at risk for cirrhosis given duration of infection. She should be screen for [facility] a year following eradication of infection. Will have to see if we can get her PPI usage down to where we can consider stopping for treatment as this interacts with Hep C meds current  dose. She has received #1 hep b shot per primary care team. Would also recommend Pneumovax if not already given. Will check FibroTest after acute infection resolves to see what estimated Metavir score looks like, but likely err on treating as compensated cirrhotic.   Lab Results  Component Value Date   PLT 214 09/23/2019   Lab Results  Component Value Date   INR 1.2 08/28/2019           Unprioritized   RESOLVED: Long-term corticosteroid use    Tapered off      RESOLVED: Esophageal candidiasis (HCC)    Resolved. No need to continue low dose fluconazole - can stop.       Diarrhea    Ongoing - will ask SNF team to lower Miralax to PRN as it sounds like she is being over stimulated with bowel regimen.  PPIs also can be contributing esp with BID dosing. She has follow up with GI soon in early May.          Will return in 3 months.   SJanene Madeira MSN, NP-C RDallas County Hospitalfor Infectious DBensvillePager: [phone]: [phone] 10/06/19  2:28 PM

## 2019-10-01 NOTE — Progress Notes (Addendum)
Location:  [facility] Room Number: 108-A Place of Service:  SNF (31) Provider:  Durenda Age, DNP, FNP-BC  Patient Care Team: System, Pcp Not In as PCP - General  Extended Emergency Contact Information Primary Emergency Contact: Rogue Jury Mobile Phone: [phone] Relation: Niece Secondary Emergency Contact: Uwimana,Emma Mobile Phone: [phone] Relation: Niece  Code Status:  Full Code  Goals of care: Advanced Directive information Advanced Directives 09/07/2019  Does Patient Have a Medical Advance Directive? No  Would patient like information on creating a medical advance directive? No - Patient declined     Chief Complaint  Patient presents with  . Acute Visit    Weekly short-term care visit    HPI:  Pt is a 69 y.o. female seen today weekly short-term care visit.  She is a short-term rehabilitation resident of [facility].  She has a PMH of diabetes mellitus type 2, history of TIA and reported history of rheumatoid arthritis and myositis.  She was seen in the room today.  She complains of having diarrhea.  She denies having abdominal pain.  She takes MiraLAX 17 g twice a day and Senexon S 2 tabs daily.  She was admitted to [facility] on 09/15/2019 post hospitalization 09/03/2019 to 09/15/19.  She presented to the hospital with 4 days of nausea, vomiting and abdominal pain.  She was recently admitted to the hospital with similar presentation and was found to have pansensitive E. coli and Pseudomonas bacteremia as well as ESBL E. coli UTI and rotavirus she was treated with meropenem IV and switch to Levaquin for a total of 7 days treatment and discharged on 3/14.  She was restarted on IV Protonix but continued to have pain and vomiting.  CT abdomen and pelvis showed no cause for the patient's abdominal pain or vomiting.  GI was consulted and recommended adding Dulcolax suppositories, continuing  PPI, starting sucralfate.  EGD was performed on 3/22 which showed moderate gastritis, severe duodenitis and esophageal candidiasis.  Biopsy taken were cultured and grew Strongyloides stercoralis.  She was placed on continuous IV PPI for 2 days, fluconazole 400 mg IV started x1 day.  Infectious disease consulted and patient treated with a total of 1 week fluconazole 200 mg p.o. and total of 2 weeks on ivermectin p.o., to end 4/06.  ID also recommended for patient to remain on fluconazole 100 mg and will follow up with GI.   Past Medical History:  Diagnosis Date  . 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  . Diabetes mellitus type II, controlled ([place]) 08/27/2019  . Hepatitis C    Past Surgical History:  Procedure Laterality Date  . BIOPSY  09/07/2019   Procedure: BIOPSY;  Surgeon: Wilford Corner, MD;  Location: [facility];  Service: Endoscopy;;  . ESOPHAGEAL BRUSHING  09/07/2019   Procedure: ESOPHAGEAL BRUSHING;  Surgeon: Wilford Corner, MD;  Location: [facility];  Service: Endoscopy;;  . ESOPHAGOGASTRODUODENOSCOPY (EGD) WITH PROPOFOL Left 09/07/2019   Procedure: ESOPHAGOGASTRODUODENOSCOPY (EGD) WITH PROPOFOL;  Surgeon: Wilford Corner, MD;  Location: [facility];  Service: Endoscopy;  Laterality: Left;    No Known Allergies  Outpatient Encounter Medications as of 10/01/2019  Medication Sig  . acetaminophen (TYLENOL) 500 MG tablet Take 500 mg by mouth every 8 (eight) hours as needed for mild pain or moderate pain.  Marland Kitchen aspirin EC 81 MG tablet Take 81 mg by mouth daily.  Marland Kitchen  atorvastatin (LIPITOR) 40 MG tablet Take 1 tablet (40 mg total) by mouth daily at 6 PM.  . bisacodyl (DULCOLAX) 5 MG EC tablet Take 1 tablet (5 mg total) by mouth daily as needed for moderate constipation.  . hydrocortisone-pramoxine (PROCTOFOAM-HC) rectal foam Place 1 applicator rectally 2 (two) times daily as needed for hemorrhoids.    Marland Kitchen liver oil-zinc oxide (DESITIN) 40 % ointment Apply 1 application topically daily.  . metFORMIN (FORTAMET) 1000 MG (OSM) 24 hr tablet Take 1 tablet (1,000 mg total) by mouth daily with breakfast.  . Nutritional Supplement LIQD Take 120 mLs by mouth in the morning and at bedtime. NSA MedPass  . pantoprazole (PROTONIX) 40 MG tablet Take 1 tablet (40 mg total) by mouth 2 (two) times daily.  . polyethylene glycol (MIRALAX / GLYCOLAX) 17 g packet Take 17 g by mouth 2 (two) times daily.  . sennosides-docusate sodium (SENOKOT-S) 8.6-50 MG tablet Take 1 tablet by mouth daily.  . traMADol (ULTRAM) 50 MG tablet Take 1 tablet (50 mg total) by mouth every 8 (eight) hours as needed.  . blood glucose meter kit and supplies KIT Dispense based on patient and insurance preference. Use up to four times daily as directed. (FOR ICD-9 250.00, 250.01). (Patient not taking: Reported on 10/01/2019)  . [DISCONTINUED] acetaminophen (TYLENOL) 325 MG tablet Take 650 mg by mouth every 6 (six) hours.  . [DISCONTINUED] aspirin EC 81 MG EC tablet Take 1 tablet (81 mg total) by mouth daily.  . [DISCONTINUED] predniSONE (DELTASONE) 5 MG tablet Take 1 tablet (5 mg total) by mouth daily with breakfast. Take 2 tablets by mouth daily from 3/31- 4/2, followed by 1 tablet by mouth daily from 4/3-4/6  . [DISCONTINUED] senna (SENOKOT) 8.6 MG TABS tablet Take 2 tablets (17.2 mg total) by mouth daily.   No facility-administered encounter medications on file as of 10/01/2019.    Review of Systems  GENERAL: No change in appetite, no fatigue, no weight changes, no fever, chills or weakness MOUTH and THROAT: Denies oral discomfort, gingival pain or bleeding, pain from teeth or hoarseness   RESPIRATORY: no cough, SOB, DOE, wheezing, hemoptysis CARDIAC: No chest pain, edema or palpitations GI: No abdominal pain, +diarrhea GU: Denies dysuria, frequency, hematuria, incontinence, or discharge NEUROLOGICAL: Denies dizziness, syncope, numbness,  or headache PSYCHIATRIC: Denies feelings of depression or anxiety. No report of hallucinations, insomnia, paranoia, or agitation   Immunization History  Administered Date(s) Administered  . Hepatitis B, adult 09/15/2019  . Influenza, High Dose Seasonal PF 03/28/2019  . Pneumococcal Polysaccharide-23 04/17/2018   Pertinent  Health Maintenance Due  Topic Date Due  . FOOT EXAM  Never done  . OPHTHALMOLOGY EXAM  Never done  . URINE MICROALBUMIN  Never done  . MAMMOGRAM  Never done  . COLONOSCOPY  Never done  . DEXA SCAN  Never done  . PNA vac Low Risk Adult (2 of 2 - PCV13) 04/18/2019  . INFLUENZA VACCINE  01/17/2020  . HEMOGLOBIN A1C  02/26/2020    Vitals:   10/01/19 1618  BP: 110/61  Pulse: 75  Resp: 18  Temp: 98.9 F (37.2 C)  TempSrc: Oral  Weight: 184 lb 6.4 oz (83.6 kg)  Height: _0  (1.6 m)   Body mass index is 32.66 kg/m.  Physical Exam  GENERAL APPEARANCE: Well nourished. In no acute distress.  Obese SKIN: Sacral wound stage II, with dressing  MOUTH and THROAT: Lips are without lesions. Oral mucosa is moist and without lesions. Tongue is normal in  shape, size, and color and without lesions RESPIRATORY: Breathing is even & unlabored, BS CTAB CARDIAC: RRR, no murmur,no extra heart sounds, no edema GI: Abdomen soft, normal BS, no masses, no tenderness EXTREMITIES: Able to move x4 extremities NEUROLOGICAL: There is no tremor. Speech is clear PSYCHIATRIC:  Affect and behavior are appropriate  Labs reviewed: Recent Labs    08/26/19 0934 08/27/19 0533 09/07/19 0411 09/07/19 0411 09/08/19 0259 09/09/19 0422 09/12/19 0600 09/13/19 0252 09/14/19 0315  NA  --    < > 129*   < > 131*   < > 131* 132* 132*  K  --    < > 3.3*   < > 3.4*   < > 3.4* 4.0 4.2  CL  --    < > 103   < > 109   < > 104 104 103  CO2  --    < > 18*   < > 18*   < > 20* 20* 22  GLUCOSE  --    < > 144*   < > 68*   < > 193* 234* 200*  BUN  --    < > 6*   < > 5*   < > _0 CREATININE  --     < > 0.51   < > 0.52   < > 0.51 0.62 0.60  CALCIUM  --    < > 7.1*   < > 6.5*   < > 7.4* 7.7* 7.5*  MG 1.9  --  1.6*  --  1.8  --   --   --   --    < > = values in this interval not displayed.   Recent Labs    09/04/19 0026 09/04/19 0026 09/04/19 0759 09/05/19 0328 09/06/19 0751  AST 33  --   --  24 31  ALT 37  --   --  28 29  ALKPHOS 69  --   --  52 57  BILITOT 1.7*   < > 1.5* 1.3* 1.0  PROT 5.8*  --   --  4.6* 4.5*  ALBUMIN 2.4*  --   --  1.8* 1.8*   < > = values in this interval not displayed.   Recent Labs    08/26/19 0836 08/27/19 0533 09/12/19 0600 09/12/19 0600 09/13/19 0252 09/14/19 0315  WBC 8.8   < > 5.5  --  6.2 6.3  NEUTROABS 5.8  --   --   --   --   --   HGB 14.2   < > 9.1*  --  10.0* 10.2*  HCT 41.6   < > 26.6*   < > 30.1* 31.2*  30.1*  MCV 98.6   < > 96.0  --  99.3 98.7  PLT 164   < > 105*  --  120* 106*   < > = values in this interval not displayed.   Lab Results  Component Value Date   TSH 0.694 09/04/2019   Lab Results  Component Value Date   HGBA1C 8.0 (H) 08/26/2019   Lab Results  Component Value Date   CHOL 95 08/26/2019   HDL 32 (L) 08/26/2019   LDLCALC 44 08/26/2019   TRIG 95 08/26/2019   CHOLHDL 3.0 08/26/2019     Assessment/Plan  1. Diarrhea due to drug - will change MiraLAX to PRN and continue Senexon-S 50-8.6 mg 2 tabs daily  2. Generalized abdominal pain -Denies abdominal pain, continue pantoprazole 40 mg 1 tab  twice a day and tramadol 50 mg every 8 hours PRN  3. Intestinal strongyloidiasis -Completed ivermectin x2 weeks, ended on 4/6 -Followed up with infectious disease today    Family/ staff Communication: Discussed plan of care with resident and charge nurse  Labs/tests ordered: None  Goals of care:   Short-term care   Durenda Age, DNP, FNP-BC [facility] [phone] (Monday-Friday 8:00 a.m. - 5:00 p.m.) [phone] (after hours)

## 2019-10-06 ENCOUNTER — Encounter: Payer: Self-pay | Admitting: Infectious Diseases

## 2019-10-06 NOTE — Assessment & Plan Note (Signed)
Tapered off

## 2019-10-06 NOTE — Assessment & Plan Note (Signed)
Resolved. No need to continue low dose fluconazole - can stop.

## 2019-10-06 NOTE — Assessment & Plan Note (Signed)
She completed 2 weeks of ivermectin for hyperinfection with last dose given on 4/06. She seems to have improved following treatment but now troubled by diarrhea. See below.  Will ask SNF to obtain stool O&P.  Repeat strongyloides serum antibody in 26m.  FU in 3 months.

## 2019-10-06 NOTE — Assessment & Plan Note (Addendum)
Possible cirrhosis identified on CT but no signs of decompensation. Genotype 4, treatment naive. She has FU with GI team for liver care - no findings that suggest hepatic gastropathy or portal hypertension on EGD. Normal platelets and INR suggesting intact liver function. She is certainly at risk for cirrhosis given duration of infection. She should be screen for [facility] twice a year following eradication of infection. Will have to see if we can get her PPI usage down to where we can consider stopping for treatment as this interacts with Hep C meds current dose. She has received #1 hep b shot per primary care team. Would also recommend Pneumovax if not already given. Will check FibroTest after acute infection resolves to see what estimated Metavir score looks like, but likely err on treating as compensated cirrhotic.   Lab Results  Component Value Date   PLT 214 09/23/2019   Lab Results  Component Value Date   INR 1.2 08/28/2019

## 2019-10-06 NOTE — Assessment & Plan Note (Signed)
Ongoing - will ask SNF team to lower Miralax to PRN as it sounds like she is being over stimulated with bowel regimen.  PPIs also can be contributing esp with BID dosing. She has follow up with GI soon in early May.

## 2019-10-08 ENCOUNTER — Encounter: Payer: Self-pay | Admitting: Adult Health

## 2019-10-08 ENCOUNTER — Non-Acute Institutional Stay (SKILLED_NURSING_FACILITY): Payer: Medicare Other | Admitting: Adult Health

## 2019-10-08 DIAGNOSIS — B780 Intestinal strongyloidiasis: Secondary | ICD-10-CM | POA: Diagnosis not present

## 2019-10-08 DIAGNOSIS — D649 Anemia, unspecified: Secondary | ICD-10-CM

## 2019-10-08 DIAGNOSIS — E1169 Type 2 diabetes mellitus with other specified complication: Secondary | ICD-10-CM | POA: Diagnosis not present

## 2019-10-08 DIAGNOSIS — B3781 Candidal esophagitis: Secondary | ICD-10-CM | POA: Diagnosis not present

## 2019-10-08 NOTE — Progress Notes (Signed)
Location:  [place] Room Number: 108-A Place of Service:  SNF (31) Provider:  Durenda Age, DNP, FNP-BC  Patient Care Team: System, Pcp Not In as PCP - General  Extended Emergency Contact Information Primary Emergency Contact: Rogue Jury Mobile Phone: [phone] Relation: Niece Secondary Emergency Contact: Uwimana,Emma Mobile Phone: [phone] Relation: Niece  Code Status:  Full Code  Goals of care: Advanced Directive information Advanced Directives 09/07/2019  Does Patient Have a Medical Advance Directive? No  Would patient like information on creating a medical advance directive? No - Patient declined     Chief Complaint  Patient presents with  . Medical Management of Chronic Issues    Routine rehabilitation visit    HPI:  Pt is a 69 y.o. female seen to day for routine rehabilitation visit. She is a short-term rehabilitation resident of [facility].  She has a PMH of diabetes mellitus type 2 and history of TIA. She was seen in the room today. She was sitting on the edge of the bed. She denies abdominal pain. She had a fall on 4/17 and there was no reported injury. It was reported that she tried to transfer unassisted. Re-education on use of call light was done. She recently followed up with Infectious Disease on 10/01/19 for strongyloidiasis gastritis. Frequent stooling has now stopped after changing Miralax to PRN and cutting Senna-S 2 tabs at bedtime instead of twice a day. CBGs ranging from 104 to 196. Currently on Metformin mg daily for diabetes.   Past Medical History:  Diagnosis Date  . 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  . Diabetes mellitus type II, controlled ([place]) 08/27/2019  . Hepatitis C    Past Surgical History:  Procedure Laterality Date  . BIOPSY  09/07/2019   Procedure: BIOPSY;  Surgeon: Wilford Corner, MD;  Location: [place];  Service: Endoscopy;;  . ESOPHAGEAL BRUSHING  09/07/2019   Procedure: ESOPHAGEAL BRUSHING;  Surgeon: Wilford Corner, MD;  Location: [place];  Service: Endoscopy;;  . ESOPHAGOGASTRODUODENOSCOPY (EGD) WITH PROPOFOL Left 09/07/2019   Procedure: ESOPHAGOGASTRODUODENOSCOPY (EGD) WITH PROPOFOL;  Surgeon: Wilford Corner, MD;  Location: [place];  Service: Endoscopy;  Laterality: Left;    No Known Allergies  Outpatient Encounter Medications as of 10/08/2019  Medication Sig  . acetaminophen (TYLENOL) 500 MG tablet Take 500 mg by mouth every 8 (eight) hours as needed for mild pain or moderate pain.  Marland Kitchen aspirin EC 81 MG tablet Take 81 mg by mouth daily.  Marland Kitchen atorvastatin (LIPITOR) 40 MG tablet Take 1 tablet (40 mg total) by mouth daily at 6 PM.  . bisacodyl (DULCOLAX) 5 MG EC tablet Take 1 tablet (5 mg total) by mouth daily as needed for moderate constipation.  . blood glucose meter kit and supplies KIT Dispense based on patient and insurance preference. Use up to four times daily as directed. (FOR ICD-9 250.00, 250.01). (Patient not taking: Reported on 10/01/2019)  . hydrocortisone-pramoxine (PROCTOFOAM-HC) rectal foam Place 1 applicator rectally 2 (two) times daily as needed for hemorrhoids.  Marland Kitchen liver oil-zinc oxide (DESITIN) 40 % ointment Apply 1 application topically daily.  . metFORMIN (FORTAMET) 1000 MG (OSM) 24 hr tablet Take 1 tablet (1,000 mg total) by mouth daily with breakfast.  . Nutritional Supplement LIQD Take 120 mLs by mouth in the morning and at bedtime. NSA MedPass  . pantoprazole (PROTONIX) 40 MG tablet Take 1 tablet (40 mg total)  by mouth 2 (two) times daily.  . polyethylene glycol (MIRALAX / GLYCOLAX) 17 g packet Take 17 g by mouth 2 (two) times daily.  . sennosides-docusate sodium (SENOKOT-S) 8.6-50 MG tablet Take 1 tablet by mouth daily.  . traMADol (ULTRAM) 50 MG tablet Take 1 tablet (50 mg total) by mouth every 8 (eight) hours as  needed.   No facility-administered encounter medications on file as of 10/08/2019.    Review of Systems  Able to speak a ;iitle bit of English with gestures  GENERAL: No change in appetite, no fatigue, no weight changes, no fever, chills or weakness MOUTH and THROAT: Denies oral discomfort, gingival pain or bleeding RESPIRATORY: no cough, SOB, DOE, wheezing, hemoptysis CARDIAC: No chest pain or palpitations GI: No abdominal pain, diarrhea, constipation, heart burn, nausea or vomiting GU: Denies dysuria, frequency, hematuria, incontinence, or discharge NEUROLOGICAL: Denies dizziness, syncope, numbness, or headache PSYCHIATRIC: Denies feelings of depression or anxiety. No report of hallucinations, insomnia, paranoia, or agitation    Immunization History  Administered Date(s) Administered  . Hepatitis B, adult 09/15/2019  . Influenza, High Dose Seasonal PF 03/28/2019  . Pneumococcal Polysaccharide-23 04/17/2018   Pertinent  Health Maintenance Due  Topic Date Due  . FOOT EXAM  Never done  . OPHTHALMOLOGY EXAM  Never done  . URINE MICROALBUMIN  Never done  . MAMMOGRAM  Never done  . COLONOSCOPY  Never done  . DEXA SCAN  Never done  . PNA vac Low Risk Adult (2 of 2 - PCV13) 04/18/2019  . INFLUENZA VACCINE  01/17/2020  . HEMOGLOBIN A1C  02/26/2020    Vitals:   10/08/19 1211  BP: 120/80  Pulse: (!) 102  Resp: 20  Temp: (!) 97.4 F (36.3 C)  TempSrc: Oral  Weight: 183 lb 3.2 oz (83.1 kg)  Height: 5' 3"  (1.6 m)   Body mass index is 32.45 kg/m.  Physical Exam  GENERAL APPEARANCE: Well nourished. In no acute distress. Obese SKIN:  Skin is warm and dry.  MOUTH and THROAT: Lips are without lesions. Oral mucosa is moist and without lesions. Tongue is normal in shape, size, and color and without lesions RESPIRATORY: Breathing is even & unlabored, BS CTAB CARDIAC: RRR, no murmur,no extra heart sounds, bilateral feet 2+ edema GI: Abdomen soft, normal BS, no masses, no  tenderness EXTREMITIES:  Able to move X 3 extremities NEUROLOGICAL: There is no tremor. Speech is clear. PSYCHIATRIC:  Affect and behavior are appropriate  Labs reviewed: Recent Labs    08/26/19 0934 08/27/19 0533 09/07/19 0411 09/07/19 0411 09/08/19 0259 09/09/19 0422 09/12/19 0600 09/12/19 0600 09/13/19 0252 09/14/19 0315 09/23/19 0000  NA  --    < > 129*   < > 131*   < > 131*   < > 132* 132* 134*  K  --    < > 3.3*   < > 3.4*   < > 3.4*   < > 4.0 4.2 4.3  CL  --    < > 103   < > 109   < > 104   < > 104 103 99  CO2  --    < > 18*   < > 18*   < > 20*   < > 20* 22 21  GLUCOSE  --    < > 144*   < > 68*   < > 193*  --  234* 200*  --   BUN  --    < > 6*   < >  5*   < > 8   < > 11 14 9   CREATININE  --    < > 0.51   < > 0.52   < > 0.51   < > 0.62 0.60 0.4*  CALCIUM  --    < > 7.1*   < > 6.5*   < > 7.4*   < > 7.7* 7.5* 8.3*  MG 1.9  --  1.6*  --  1.8  --   --   --   --   --   --    < > = values in this interval not displayed.   Recent Labs    09/04/19 0026 09/04/19 0026 09/04/19 0759 09/05/19 0328 09/06/19 0751  AST 33  --   --  24 31  ALT 37  --   --  28 29  ALKPHOS 69  --   --  52 57  BILITOT 1.7*   < > 1.5* 1.3* 1.0  PROT 5.8*  --   --  4.6* 4.5*  ALBUMIN 2.4*  --   --  1.8* 1.8*   < > = values in this interval not displayed.   Recent Labs    08/26/19 0836 08/27/19 0533 09/12/19 0600 09/12/19 0600 09/13/19 0252 09/14/19 0315 09/23/19 0000  WBC 8.8   < > 5.5   < > 6.2 6.3 5.1  NEUTROABS 5.8  --   --   --   --   --  2  HGB 14.2   < > 9.1*   < > 10.0* 10.2* 10.8*  HCT 41.6   < > 26.6*   < > 30.1* 31.2*  30.1* 32*  MCV 98.6   < > 96.0  --  99.3 98.7  --   PLT 164   < > 105*   < > 120* 106* 214   < > = values in this interval not displayed.   Lab Results  Component Value Date   TSH 0.694 09/04/2019   Lab Results  Component Value Date   HGBA1C 8.0 (H) 08/26/2019   Lab Results  Component Value Date   CHOL 95 08/26/2019   HDL 32 (L) 08/26/2019   LDLCALC  44 08/26/2019   TRIG 95 08/26/2019   CHOLHDL 3.0 08/26/2019    Assessment/Plan  1. Type 2 diabetes mellitus with other specified complication, without long-term current use of insulin (HCC) Lab Results  Component Value Date   HGBA1C 8.0 (H) 08/26/2019   -CBGs stable, continue Metformin 1000 mg 1 tab daily  2. Normocytic anemia Lab Results  Component Value Date   HGB 10.8 (A) 09/23/2019   - improved from hgb 10.2 on 09/14/19  3. 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  Labs/tests ordered: None  Goals of care:   Short-term care   Durenda Age, DNP, FNP-BC [facility] [phone] (Monday-Friday 8:00 a.m. - 5:00 p.m.) [phone] (after hours)

## 2019-10-19 ENCOUNTER — Encounter: Payer: Self-pay | Admitting: Adult Health

## 2019-10-19 ENCOUNTER — Non-Acute Institutional Stay (SKILLED_NURSING_FACILITY): Payer: Medicare Other | Admitting: Adult Health

## 2019-10-19 DIAGNOSIS — D649 Anemia, unspecified: Secondary | ICD-10-CM

## 2019-10-19 DIAGNOSIS — E1169 Type 2 diabetes mellitus with other specified complication: Secondary | ICD-10-CM

## 2019-10-19 DIAGNOSIS — R634 Abnormal weight loss: Secondary | ICD-10-CM

## 2019-10-19 DIAGNOSIS — B182 Chronic viral hepatitis C: Secondary | ICD-10-CM

## 2019-10-19 DIAGNOSIS — B780 Intestinal strongyloidiasis: Secondary | ICD-10-CM | POA: Diagnosis not present

## 2019-10-19 DIAGNOSIS — K2990 Gastroduodenitis, unspecified, without bleeding: Secondary | ICD-10-CM

## 2019-10-19 NOTE — Progress Notes (Signed)
Location:  [place] Room Number: 108-A Place of Service:  SNF (31) Provider:  Durenda Age, DNP, FNP-BC  Patient Care Team: System, Pcp Not In as PCP - General  Extended Emergency Contact Information Primary Emergency Contact: Rogue Jury Mobile Phone: [phone] Relation: Niece Secondary Emergency Contact: Uwimana,Emma Mobile Phone: [phone] Relation: Niece  Code Status:  Full Code  Goals of care: Advanced Directive information Advanced Directives 09/07/2019  Does Patient Have a Medical Advance Directive? No  Would patient like information on creating a medical advance directive? No - Patient declined     Chief Complaint  Patient presents with   Medical Management of Chronic Issues    Routine [facility] visit    HPI:  Pt is a 69 y.o. female seen today for medical management of chronic diseases.  She is a short-term care resident of [facility].  She has a PMH of diabetes mellitus type 2 and TIA. She was admitted to [facility] on 09/15/2019 post hospitalization 09/03/2019 to 09/15/2019.  She presented to the hospital with 4 days of nausea, vomiting and abdominal pain.  She was recently admitted to the hospital with similar presentation and was found to have pansensitive E. coli and Pseudomonas bacteremia as well as ESBL E. coli UTI and rotavirus.  She was treated with meropenem IV and switched to Levaquin for a total of 7 days treatment and discharge on 3/14.  She was restarted on IV Protonix but continued to have pain and vomiting.  CT abdomen and pelvis showed no cause for the patient's abdominal pain or vomiting.  GI was consulted and recommended adding Dulcolax suppositories, continuing PPI, starting sucralfate.  EGD was performed on 3/22 which showed moderate gastritis, severe duodenitis and esophageal candidiasis.  Biopsy taken were cultured and grew Strongyloides stercoralis.  She was placed on continuous IV  PPI for 2 days, fluconazole 400 mg IV started x1 day.  Infectious disease consulted and patient treated with a total of 1 week fluconazole 200 mg p.o. and total of 2 weeks of ivermectin p.o., and then 4/06.  ID also recommended for patient to remain on fluconazole 100 mg and strict follow-up with GI.  She was seen in her room today.  She reported walking with walker during therapy. Abdominal pain has improved, she now has only occasional abdominal pains. It was reported that she had a total of 17 lbs weight loss since admission on 09/15/19.   Past Medical History:  Diagnosis Date   Abnormal liver CT 08/27/2019   CT AP (08/26/19, [facility]): Mildly nodular hepatic contour c/w possible mild cirrhosis.   Diabetes mellitus type II, controlled ([place]) 08/27/2019   Hepatitis C    Past Surgical History:  Procedure Laterality Date   BIOPSY  09/07/2019   Procedure: BIOPSY;  Surgeon: Wilford Corner, MD;  Location: [place];  Service: Endoscopy;;   ESOPHAGEAL BRUSHING  09/07/2019   Procedure: ESOPHAGEAL BRUSHING;  Surgeon: Wilford Corner, MD;  Location: [place];  Service: Endoscopy;;   ESOPHAGOGASTRODUODENOSCOPY (EGD) WITH PROPOFOL Left 09/07/2019   Procedure: ESOPHAGOGASTRODUODENOSCOPY (EGD) WITH PROPOFOL;  Surgeon: Wilford Corner, MD;  Location: [place];  Service: Endoscopy;  Laterality: Left;    No Known Allergies  Outpatient Encounter Medications as of 10/19/2019  Medication Sig   acetaminophen (TYLENOL) 500 MG tablet Take 500 mg by mouth every 8 (eight) hours as needed for mild pain or moderate pain.   aspirin EC 81 MG tablet Take 81 mg by mouth daily.  atorvastatin (LIPITOR) 40 MG tablet Take 1 tablet (40 mg total) by mouth daily at 6 PM.   bisacodyl (DULCOLAX) 5 MG EC tablet Take 1 tablet (5 mg total) by mouth daily as needed for moderate constipation.   hydrocortisone-pramoxine (PROCTOFOAM-HC) rectal foam Place 1 applicator rectally 2 (two) times daily as needed  for hemorrhoids.   liver oil-zinc oxide (DESITIN) 40 % ointment Apply 1 application topically daily.   metFORMIN (FORTAMET) 1000 MG (OSM) 24 hr tablet Take 1 tablet (1,000 mg total) by mouth daily with breakfast.   pantoprazole (PROTONIX) 40 MG tablet Take 1 tablet (40 mg total) by mouth 2 (two) times daily.   polyethylene glycol (MIRALAX / GLYCOLAX) 17 g packet Take 17 g by mouth 2 (two) times daily.   sennosides-docusate sodium (SENOKOT-S) 8.6-50 MG tablet Take 1 tablet by mouth daily.   traMADol (ULTRAM) 50 MG tablet Take 1 tablet (50 mg total) by mouth every 8 (eight) hours as needed.   [DISCONTINUED] Nutritional Supplement LIQD Take 120 mLs by mouth in the morning and at bedtime. NSA MedPass   blood glucose meter kit and supplies KIT Dispense based on patient and insurance preference. Use up to four times daily as directed. (FOR ICD-9 250.00, 250.01). (Patient not taking: Reported on 10/01/2019)   No facility-administered encounter medications on file as of 10/19/2019.    Review of Systems  GENERAL: No change in appetite, no fatigue, no weight changes, no fever, chills or weakness MOUTH and THROAT: Denies oral discomfort, gingival pain or bleeding RESPIRATORY: no cough, SOB, DOE, wheezing, hemoptysis CARDIAC: No chest pain, edema or palpitations GI: No heart burn, nausea or vomiting GU: Denies dysuria, frequency, hematuria, incontinence, or discharge NEUROLOGICAL: Denies dizziness, syncope, numbness, or headache PSYCHIATRIC: Denies feelings of depression or anxiety. No report of hallucinations, insomnia, paranoia, or agitation   Immunization History  Administered Date(s) Administered   Hepatitis B, adult 09/15/2019   Influenza, High Dose Seasonal PF 03/28/2019   Pneumococcal Polysaccharide-23 04/17/2018   Pertinent  Health Maintenance Due  Topic Date Due   FOOT EXAM  Never done   OPHTHALMOLOGY EXAM  Never done   MAMMOGRAM  Never done   DEXA SCAN  Never done    PNA vac Low Risk Adult (2 of 2 - PCV13) 04/18/2019   INFLUENZA VACCINE  01/17/2020   HEMOGLOBIN A1C  02/26/2020   URINE MICROALBUMIN  06/22/2020   COLONOSCOPY  05/04/2026    Vitals:   10/19/19 1059  BP: 110/62  Pulse: 66  Resp: 18  Temp: 98.2 F (36.8 C)  TempSrc: Oral  SpO2: 97%  Weight: 185 lb (83.9 kg)  Height: 5' 3"  (1.6 m)   Body mass index is 32.77 kg/m.  Physical Exam  GENERAL APPEARANCE: Well nourished. In no acute distress. Obese. SKIN:  Skin is warm and dry.  MOUTH and THROAT: Lips are without lesions. Oral mucosa is moist and without lesions. Tongue is normal in shape, size, and color and without lesions RESPIRATORY: Breathing is even & unlabored, BS CTAB CARDIAC: RRR, no murmur,no extra heart sounds, no edema GI: Abdomen soft, normal BS, no masses, no tenderness NEUROLOGICAL:  Speech is clear. PSYCHIATRIC:  Affect and behavior are appropriate  Labs reviewed: Recent Labs    08/26/19 0934 08/27/19 0533 09/07/19 0411 09/07/19 0411 09/08/19 0259 09/09/19 0422 09/12/19 0600 09/12/19 0600 09/13/19 0252 09/14/19 0315 09/23/19 0000  NA  --    < > 129*   < > 131*   < > 131*   < >  132* 132* 134*  K  --    < > 3.3*   < > 3.4*   < > 3.4*   < > 4.0 4.2 4.3  CL  --    < > 103   < > 109   < > 104   < > 104 103 99  CO2  --    < > 18*   < > 18*   < > 20*   < > 20* 22 21  GLUCOSE  --    < > 144*   < > 68*   < > 193*  --  234* 200*  --   BUN  --    < > 6*   < > 5*   < > 8   < > 11 14 9   CREATININE  --    < > 0.51   < > 0.52   < > 0.51   < > 0.62 0.60 0.4*  CALCIUM  --    < > 7.1*   < > 6.5*   < > 7.4*   < > 7.7* 7.5* 8.3*  MG 1.9  --  1.6*  --  1.8  --   --   --   --   --   --    < > = values in this interval not displayed.   Recent Labs    09/04/19 0026 09/04/19 0026 09/04/19 0759 09/05/19 0328 09/06/19 0751  AST 33  --   --  24 31  ALT 37  --   --  28 29  ALKPHOS 69  --   --  52 57  BILITOT 1.7*   < > 1.5* 1.3* 1.0  PROT 5.8*  --   --  4.6* 4.5*    ALBUMIN 2.4*  --   --  1.8* 1.8*   < > = values in this interval not displayed.   Recent Labs    08/26/19 0836 08/27/19 0533 09/12/19 0600 09/12/19 0600 09/13/19 0252 09/14/19 0315 09/23/19 0000  WBC 8.8   < > 5.5   < > 6.2 6.3 5.1  NEUTROABS 5.8  --   --   --   --   --  2  HGB 14.2   < > 9.1*   < > 10.0* 10.2* 10.8*  HCT 41.6   < > 26.6*   < > 30.1* 31.2*   30.1* 32*  MCV 98.6   < > 96.0  --  99.3 98.7  --   PLT 164   < > 105*   < > 120* 106* 214   < > = values in this interval not displayed.   Lab Results  Component Value Date   TSH 0.694 09/04/2019   Lab Results  Component Value Date   HGBA1C 8.0 (H) 08/26/2019   Lab Results  Component Value Date   CHOL 95 08/26/2019   HDL 32 (L) 08/26/2019   LDLCALC 44 08/26/2019   TRIG 95 08/26/2019   CHOLHDL 3.0 08/26/2019    Assessment/Plan  1. Normocytic anemia Lab Results  Component Value Date   HGB 10.8 (A) 09/23/2019   - stable  2. Type 2 diabetes mellitus with other specified complication, without long-term current use of insulin (HCC) Lab Results  Component Value Date   HGBA1C 8.0 (H) 08/26/2019   -  Continue Metformin  3. Chronic hepatitis C without hepatic coma (HCC) -  Follows up with ID  4. Intestinal strongyloidiasis - S/P Ivermectin  treatment X 2 weeks - follows up with ID in 3 months  5. Gastritis and duodenitis - abdominal pain has improved, continue Pantoprazole and PRN Tramadol  6. Weight Loss -will increase No sugar added Med pass 120 ml from BID to TID     Family/ staff Communication:  Discussed plan of care with resident and charge nurse.  Labs/tests ordered:  None  Goals of care:   Short-term care   Durenda Age, DNP, FNP-BC [facility] [phone] (Monday-Friday 8:00 a.m. - 5:00 p.m.) [phone] (after hours)

## 2019-10-23 ENCOUNTER — Other Ambulatory Visit: Payer: Self-pay | Admitting: Student in an Organized Health Care Education/Training Program

## 2019-10-28 ENCOUNTER — Encounter: Payer: Self-pay | Admitting: Adult Health

## 2019-10-28 ENCOUNTER — Non-Acute Institutional Stay (SKILLED_NURSING_FACILITY): Payer: Medicare Other | Admitting: Adult Health

## 2019-10-28 DIAGNOSIS — B780 Intestinal strongyloidiasis: Secondary | ICD-10-CM

## 2019-10-28 DIAGNOSIS — K2990 Gastroduodenitis, unspecified, without bleeding: Secondary | ICD-10-CM

## 2019-10-28 DIAGNOSIS — B182 Chronic viral hepatitis C: Secondary | ICD-10-CM | POA: Diagnosis not present

## 2019-10-28 DIAGNOSIS — R1084 Generalized abdominal pain: Secondary | ICD-10-CM

## 2019-10-28 DIAGNOSIS — B3781 Candidal esophagitis: Secondary | ICD-10-CM | POA: Diagnosis not present

## 2019-10-28 DIAGNOSIS — M609 Myositis, unspecified: Secondary | ICD-10-CM

## 2019-10-28 DIAGNOSIS — B9620 Unspecified Escherichia coli [E. coli] as the cause of diseases classified elsewhere: Secondary | ICD-10-CM

## 2019-10-28 DIAGNOSIS — E1169 Type 2 diabetes mellitus with other specified complication: Secondary | ICD-10-CM

## 2019-10-28 DIAGNOSIS — E785 Hyperlipidemia, unspecified: Secondary | ICD-10-CM

## 2019-10-28 DIAGNOSIS — R7881 Bacteremia: Secondary | ICD-10-CM

## 2019-10-28 DIAGNOSIS — R5381 Other malaise: Secondary | ICD-10-CM

## 2019-10-28 MED ORDER — METFORMIN HCL ER (OSM) 1000 MG PO TB24: 1000.0000 mg | ORAL_TABLET | Freq: Every day | ORAL | 0 refills | Status: DC

## 2019-10-28 MED ORDER — TRAMADOL HCL 50 MG PO TABS: 50.0000 mg | ORAL_TABLET | Freq: Three times a day (TID) | ORAL | 0 refills | Status: DC | PRN

## 2019-10-28 MED ORDER — PANTOPRAZOLE SODIUM 40 MG PO TBEC: 40.0000 mg | DELAYED_RELEASE_TABLET | Freq: Two times a day (BID) | ORAL | 0 refills | Status: DC

## 2019-10-28 MED ORDER — ATORVASTATIN CALCIUM 40 MG PO TABS: 40.0000 mg | ORAL_TABLET | Freq: Every day | ORAL | 0 refills | Status: DC

## 2019-10-28 NOTE — Progress Notes (Signed)
Location:  [place] Room Number: 108-A Place of Service:  SNF (31) Provider:  Durenda Age, DNP, FNP-BC  Patient Care Team: System, Pcp Not In as PCP - General  Extended Emergency Contact Information Primary Emergency Contact: Rogue Jury Mobile Phone: [phone] Relation: Niece Secondary Emergency Contact: Uwimana,Emma Mobile Phone: [phone] Relation: Niece  Code Status:  FULL CODE  Goals of care: Advanced Directive information Advanced Directives 09/07/2019  Does Patient Have a Medical Advance Directive? No  Would patient like information on creating a medical advance directive? No - Patient declined     Chief Complaint  Patient presents with  . Discharge Note    Patient is seen for discharge from SNF on 10/30/19    HPI:  Pt is a 69 y.o. female who is for discharge home on 10/30/19 with Home health PT and OT.  She was admitted to [facility] on 09/15/2019 post hospitalization 09/03/2019 to 09/15/2019.  She presented to the hospital with 4 days of nausea, vomiting and abdominal pain.  She was recently admitted to the hospital with similar presentation and was found to have pansensitive E. coli and Pseudomonas bacteremia as well as ESBL E. coli UTI and rotavirus.  She was treated with meropenem IV and switched to Levaquin for a total of 7 days treatment and discharge on 3/14.  She was restarted on IV Protonix but continued to have pain and vomiting.  CT abdomen and pelvis showed no cause for the patient's abdominal pain or vomiting.  GI was consulted and recommended adding Dulcolax suppositories, continuing PPI, starting sucralfate.  EGD was performed on 3/22 which showed moderate gastritis, severe duodenitis and esophageal candidiasis.  Biopsy taken were cultured and grew Strongyloides stercoralis.  She was placed on continuous IV PPI for 2 days, fluconazole 400 mg IV.  Infectious disease consulted and patient  treated with a total of 1 week fluconazole and total of 2 weeks ivermectin p.o. She has a PMH of diabetes mellitus type 2 and history of TIA.  Patient was admitted to this facility for short-term rehabilitation after the patient's recent hospitalization.  Patient has completed SNF rehabilitation and therapy has cleared the patient for discharge.   Past Medical History:  Diagnosis Date  . 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  . Diabetes mellitus type II, controlled ([place]) 08/27/2019  . Hepatitis C    Past Surgical History:  Procedure Laterality Date  . BIOPSY  09/07/2019   Procedure: BIOPSY;  Surgeon: Wilford Corner, MD;  Location: ;  Service: Endoscopy;;  . ESOPHAGEAL BRUSHING  09/07/2019   Procedure: ESOPHAGEAL BRUSHING;  Surgeon: Wilford Corner, MD;  Location: [facility];  Service: Endoscopy;;  . ESOPHAGOGASTRODUODENOSCOPY (EGD) WITH PROPOFOL Left 09/07/2019   Procedure: ESOPHAGOGASTRODUODENOSCOPY (EGD) WITH PROPOFOL;  Surgeon: Wilford Corner, MD;  Location: [facility];  Service: Endoscopy;  Laterality: Left;    No Known Allergies  Outpatient Encounter Medications as of 10/28/2019  Medication Sig  . acetaminophen (TYLENOL) 500 MG tablet Take 500 mg by mouth every 8 (eight) hours as needed for mild pain or moderate pain.  Marland Kitchen aspirin EC 81 MG tablet Take 81 mg by mouth daily.  Marland Kitchen atorvastatin (LIPITOR) 40 MG tablet Take 1 tablet (40 mg total) by mouth daily at 6 PM.  . bisacodyl (DULCOLAX) 5 MG EC tablet Take 1 tablet (5 mg total) by mouth daily as needed for moderate constipation.  Marland Kitchen  hydrocortisone-pramoxine (PROCTOFOAM-HC) rectal foam Place 1 applicator rectally 2 (two) times daily as needed for hemorrhoids.  Marland Kitchen liver oil-zinc oxide (DESITIN) 40 % ointment Apply 1 application topically daily.  . metFORMIN (FORTAMET) 1000 MG (OSM) 24 hr tablet Take 1 tablet (1,000 mg total) by mouth  daily with breakfast.  . Nutritional Supplement LIQD Take 120 mLs by mouth in the morning, at noon, and at bedtime.  . pantoprazole (PROTONIX) 40 MG tablet Take 1 tablet (40 mg total) by mouth 2 (two) times daily.  . polyethylene glycol (MIRALAX / GLYCOLAX) 17 g packet Take 17 g by mouth 2 (two) times daily.  . sennosides-docusate sodium (SENOKOT-S) 8.6-50 MG tablet Take 1 tablet by mouth daily.  . traMADol (ULTRAM) 50 MG tablet Take 1 tablet (50 mg total) by mouth every 8 (eight) hours as needed.  . blood glucose meter kit and supplies KIT Dispense based on patient and insurance preference. Use up to four times daily as directed. (FOR ICD-9 250.00, 250.01). (Patient not taking: Reported on 10/01/2019)   No facility-administered encounter medications on file as of 10/28/2019.    Review of Systems  GENERAL: No change in appetite, no fatigue, no weight changes, no fever, chills or weakness MOUTH and THROAT: Denies oral discomfort, gingival pain or bleeding RESPIRATORY: no cough, SOB, DOE, wheezing, hemoptysis CARDIAC: No chest pain, edema or palpitations GI: No abdominal pain, diarrhea, constipation, heart burn, nausea or vomiting GU: Denies dysuria, frequency, hematuria, incontinence, or discharge NEUROLOGICAL: Denies dizziness, syncope, numbness, or headache PSYCHIATRIC: Denies feelings of depression or anxiety. No report of hallucinations, insomnia, paranoia, or agitation    Immunization History  Administered Date(s) Administered  . Hepatitis B, adult 09/15/2019  . Influenza, High Dose Seasonal PF 03/28/2019  . Pneumococcal Polysaccharide-23 04/17/2018   Pertinent  Health Maintenance Due  Topic Date Due  . FOOT EXAM  Never done  . OPHTHALMOLOGY EXAM  Never done  . MAMMOGRAM  Never done  . DEXA SCAN  Never done  . PNA vac Low Risk Adult (2 of 2 - PCV13) 04/18/2019  . INFLUENZA VACCINE  01/17/2020  . HEMOGLOBIN A1C  02/26/2020  . URINE MICROALBUMIN  06/22/2020  . COLONOSCOPY   05/04/2026    Vitals:   10/28/19 1126  BP: 125/81  Pulse: 94  Resp: 18  Temp: (!) 96.4 F (35.8 C)  TempSrc: Oral  Weight: 183 lb 12.8 oz (83.4 kg)  Height: 5' 3"  (1.6 m)   Body mass index is 32.56 kg/m.  Physical Exam  GENERAL APPEARANCE: Well nourished. In no acute distress. Obese SKIN:  Skin is warm and dry.  MOUTH and THROAT: Lips are without lesions. Oral mucosa is moist and without lesions. Tongue is normal in shape, size, and color and without lesions RESPIRATORY: Breathing is even & unlabored, BS CTAB CARDIAC: RRR, no murmur,no extra heart sounds, no edema GI: Abdomen soft, normal BS, no masses, no tenderness NEUROLOGICAL: There is no tremor. Speech is clear. PSYCHIATRIC:  Affect and behavior are appropriate   Labs reviewed: Recent Labs    08/26/19 0934 08/27/19 0533 09/07/19 0411 09/07/19 0411 09/08/19 0259 09/09/19 0422 09/12/19 0600 09/12/19 0600 09/13/19 0252 09/14/19 0315 09/23/19 0000  NA  --    < > 129*   < > 131*   < > 131*   < > 132* 132* 134*  K  --    < > 3.3*   < > 3.4*   < > 3.4*   < > 4.0 4.2 4.3  CL  --    < > 103   < > 109   < > 104   < > 104 103 99  CO2  --    < > 18*   < > 18*   < > 20*   < > 20* 22 21  GLUCOSE  --    < > 144*   < > 68*   < > 193*  --  234* 200*  --   BUN  --    < > 6*   < > 5*   < > 8   < > 11 14 9   CREATININE  --    < > 0.51   < > 0.52   < > 0.51   < > 0.62 0.60 0.4*  CALCIUM  --    < > 7.1*   < > 6.5*   < > 7.4*   < > 7.7* 7.5* 8.3*  MG 1.9  --  1.6*  --  1.8  --   --   --   --   --   --    < > = values in this interval not displayed.   Recent Labs    09/04/19 0026 09/04/19 0026 09/04/19 0759 09/05/19 0328 09/06/19 0751  AST 33  --   --  24 31  ALT 37  --   --  28 29  ALKPHOS 69  --   --  52 57  BILITOT 1.7*   < > 1.5* 1.3* 1.0  PROT 5.8*  --   --  4.6* 4.5*  ALBUMIN 2.4*  --   --  1.8* 1.8*   < > = values in this interval not displayed.   Recent Labs    08/26/19 0836 08/27/19 0533 09/12/19 0600  09/12/19 0600 09/13/19 0252 09/14/19 0315 09/23/19 0000  WBC 8.8   < > 5.5   < > 6.2 6.3 5.1  NEUTROABS 5.8  --   --   --   --   --  2  HGB 14.2   < > 9.1*   < > 10.0* 10.2* 10.8*  HCT 41.6   < > 26.6*   < > 30.1* 31.2*  30.1* 32*  MCV 98.6   < > 96.0  --  99.3 98.7  --   PLT 164   < > 105*   < > 120* 106* 214   < > = values in this interval not displayed.   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 X 2 weeks -Follows up with [facility]  2. Esophageal candidiasis (HCC) -Was placed on continuous IV PPI for 2 days then fluconazole 200 mg p.o. x1 week then tapered off while on prednisone  3. Chronic hepatitis C without hepatic coma (HCC) -Hep B vaccine was administered before discharge from the hospital -[facility]  4. Type 2 diabetes mellitus with other specified complication, without long-term current use of insulin (HCC) Lab Results  Component Value Date   HGBA1C 8.0 (H) 08/26/2019   - metformin (FORTAMET) 1000 MG (OSM) 24 hr tablet;  Take 1 tablet (1,000 mg total) by mouth daily with breakfast.  Dispense: 30 tablet; Refill: 0  5. Bacteremia due to Escherichia coli - S/P antibiotic treatments, resolved  6. Gastritis and duodenitis - pantoprazole (PROTONIX) 40 MG tablet; Take 1 tablet (40 mg total) by mouth 2 (two) times daily.  Dispense: 60 tablet; Refill: 0  7. Generalized abdominal pain - improved - traMADol (ULTRAM) 50 MG tablet; Take 1 tablet (50 mg total) by mouth every 8 (eight) hours as needed.  Dispense: 30 tablet; Refill: 0  8. 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 Shearon Stalls, at [facility]  9. Hyperlipidemia associated with type 2 diabetes mellitus ([place]) Lab Results  Component Value Date   CHOL 95 08/26/2019   HDL 32 (L) 08/26/2019   LDLCALC 44 08/26/2019   TRIG 95 08/26/2019   CHOLHDL 3.0 08/26/2019   - atorvastatin (LIPITOR) 40 MG tablet; Take 1 tablet (40 mg total) by mouth daily at 6 PM.  Dispense: 30 tablet; Refill: 0  10. Physical deconditioning -For home health PT and OT, for therapeutic strengthening exercises      I have filled out patient's discharge paperwork and e-prescribed medications. Patient will have home health PT and OT.  DME provided:  Rolling walker and 3-in-1  Total discharge time: Greater than 30 minutes Greater than 50% was spent in counseling and coordination of care.   Discharge time involved coordination of the discharge process with social worker, nursing staff and therapy department. Medical justification for home health services/DME verified.    Durenda Age, DNP, FNP-BC [facility] [phone] (Monday-Friday 8:00 a.m. - 5:00 p.m.) [phone] (after hours)

## 2019-11-02 ENCOUNTER — Telehealth: Payer: Self-pay

## 2019-11-02 NOTE — Telephone Encounter (Signed)
Called and left secure VM with verbal orders for [facility] PT.  Shirlean Mylar, MD [facility] Family Medicine Residency, PGY-1

## 2019-11-02 NOTE — Telephone Encounter (Signed)
Barbette Or, [facility] PT, calls nurse line requesting VO for [facility] PT as follows.  2x week for 4 weeks  1x a week for 2 weeks  You may leave VO on her secure VM [phone].  Will route to [facility] as patient appears to have a new patient apt in June.

## 2019-11-04 ENCOUNTER — Telehealth: Payer: Self-pay

## 2019-11-04 NOTE — Telephone Encounter (Signed)
Meredith from [place] calls nurse line to report increased HR from evaluation today. Sharyl Nimrod reports resting HR of 106, no other symptoms.   Sharyl Nimrod also reports that patient needs no additional visit from OT at this time.   To PCP  Veronda Prude, RN

## 2019-11-18 ENCOUNTER — Other Ambulatory Visit: Payer: Self-pay | Admitting: Adult Health

## 2019-11-18 DIAGNOSIS — E785 Hyperlipidemia, unspecified: Secondary | ICD-10-CM

## 2019-11-18 DIAGNOSIS — K2990 Gastroduodenitis, unspecified, without bleeding: Secondary | ICD-10-CM

## 2019-11-18 DIAGNOSIS — E1169 Type 2 diabetes mellitus with other specified complication: Secondary | ICD-10-CM

## 2019-11-20 ENCOUNTER — Encounter: Payer: Self-pay | Admitting: Family Medicine

## 2019-11-20 ENCOUNTER — Ambulatory Visit (INDEPENDENT_AMBULATORY_CARE_PROVIDER_SITE_OTHER): Payer: Medicare Other | Admitting: Family Medicine

## 2019-11-20 ENCOUNTER — Other Ambulatory Visit: Payer: Self-pay

## 2019-11-20 VITALS — BP 134/82 | HR 106 | Ht 64.0 in | Wt 186.0 lb

## 2019-11-20 DIAGNOSIS — B182 Chronic viral hepatitis C: Secondary | ICD-10-CM

## 2019-11-20 DIAGNOSIS — D696 Thrombocytopenia, unspecified: Secondary | ICD-10-CM | POA: Diagnosis not present

## 2019-11-20 DIAGNOSIS — E871 Hypo-osmolality and hyponatremia: Secondary | ICD-10-CM | POA: Diagnosis not present

## 2019-11-20 DIAGNOSIS — D649 Anemia, unspecified: Secondary | ICD-10-CM

## 2019-11-20 DIAGNOSIS — K2990 Gastroduodenitis, unspecified, without bleeding: Secondary | ICD-10-CM | POA: Diagnosis not present

## 2019-11-20 DIAGNOSIS — B780 Intestinal strongyloidiasis: Secondary | ICD-10-CM | POA: Diagnosis not present

## 2019-11-20 DIAGNOSIS — R000 Tachycardia, unspecified: Secondary | ICD-10-CM | POA: Diagnosis not present

## 2019-11-20 DIAGNOSIS — E785 Hyperlipidemia, unspecified: Secondary | ICD-10-CM | POA: Diagnosis not present

## 2019-11-20 DIAGNOSIS — E119 Type 2 diabetes mellitus without complications: Secondary | ICD-10-CM

## 2019-11-20 DIAGNOSIS — R112 Nausea with vomiting, unspecified: Secondary | ICD-10-CM | POA: Diagnosis not present

## 2019-11-20 DIAGNOSIS — R1084 Generalized abdominal pain: Secondary | ICD-10-CM | POA: Diagnosis not present

## 2019-11-20 DIAGNOSIS — R197 Diarrhea, unspecified: Secondary | ICD-10-CM | POA: Diagnosis not present

## 2019-11-20 DIAGNOSIS — K5900 Constipation, unspecified: Secondary | ICD-10-CM

## 2019-11-20 DIAGNOSIS — I10 Essential (primary) hypertension: Secondary | ICD-10-CM

## 2019-11-20 DIAGNOSIS — E1169 Type 2 diabetes mellitus with other specified complication: Secondary | ICD-10-CM

## 2019-11-20 LAB — POCT GLYCOSYLATED HEMOGLOBIN (HGB A1C): HbA1c, POC (controlled diabetic range): 5.7 % (ref 0.0–7.0)

## 2019-11-20 MED ORDER — TRAMADOL HCL 50 MG PO TABS: 50.0000 mg | ORAL_TABLET | Freq: Three times a day (TID) | ORAL | 0 refills | Status: DC | PRN

## 2019-11-20 MED ORDER — ATORVASTATIN CALCIUM 40 MG PO TABS: 40.0000 mg | ORAL_TABLET | Freq: Every day | ORAL | 3 refills | Status: DC

## 2019-11-20 MED ORDER — PANTOPRAZOLE SODIUM 40 MG PO TBEC: 40.0000 mg | DELAYED_RELEASE_TABLET | Freq: Two times a day (BID) | ORAL | 3 refills | Status: DC

## 2019-11-20 MED ORDER — GLUCOSE BLOOD VI STRP: ORAL_STRIP | 12 refills | Status: DC

## 2019-11-20 MED ORDER — METFORMIN HCL ER (OSM) 1000 MG PO TB24: 1000.0000 mg | ORAL_TABLET | Freq: Every day | ORAL | 3 refills | Status: DC

## 2019-11-20 MED ORDER — SENNA-DOCUSATE SODIUM 8.6-50 MG PO TABS: 1.0000 | ORAL_TABLET | Freq: Every day | ORAL | 3 refills | Status: AC

## 2019-11-20 MED ORDER — SAFETY LANCET 30G/PRESSURE ACT MISC: 1.0000 | Freq: Three times a day (TID) | 14 refills | Status: DC

## 2019-11-20 MED ORDER — BLOOD GLUCOSE MONITOR KIT: PACK | 0 refills | Status: DC

## 2019-11-20 NOTE — Patient Instructions (Signed)
It was a pleasure to see you! Keep up the good work.  I took some blood work today, if anything is abnormal, I will call you.  I will see you back in July and we will discuss your heart rate more then.  Be Well!  Dr. Leary Roca

## 2019-11-20 NOTE — Progress Notes (Signed)
SUBJECTIVE:   CHIEF COMPLAINT / HPI: follow up to hospital visit  Patient has been doing well. Left SNF on May 13, now able to walk short distances without a walker, but uses a walker for long distances. She is still in PT and improving. She initially had diarrhea at the SNF and shortly after leaving, but has returned to her normal of occasional constipation. Patient uses senna as needed and find that works well for her. She has seen Dr. Dulce Sellar, [facility], and is continuing on protonix 40 mg BID by mouth for now. She has also discussed colonoscopy with him and plans to do that later this year.   Patient has followed up with ID and has another appt in July. They plan to start treating Hep C then, will likely need to have PPI reduced at that point as it will interfere with tx. I suspect this will be possible as patient reports no more nausea or sensitivity in her esophagus, no problems eating. Will have to coordinate with Dr. Dulce Sellar, however.  Her niece is present for translation and has a list of recent blood sugars and blood pressures. Blood sugar for the last 3 weeks has ranged between 90-150, evidencing very good control. Will obtain Hgb A1c today. CBG in hospital was frequently 200-300, likely due to prednisone, which patient has now fully stopped. BP appropriate for age >67 with SBP ranging b/w 135-150.   PERTINENT  PMH / PSH: severe gastritis d/t strongyloidiasis, DMT2, Hep C, HTN  OBJECTIVE:   BP 134/82    Pulse (!) 106    Ht 5\' 4"  (1.626 m)    Wt 84.4 kg    SpO2 98%    BMI 31.93 kg/m   Physical Exam Vitals and nursing note reviewed.  Constitutional:      General: She is not in acute distress.    Appearance: Normal appearance. She is obese. She is not ill-appearing or toxic-appearing.  HENT:     Head: Normocephalic and atraumatic.  Cardiovascular:     Rate and Rhythm: Regular rhythm. Tachycardia present.     Pulses: Normal pulses.     Heart sounds: Normal heart sounds. No murmur.  No friction rub. No gallop.   Pulmonary:     Effort: Pulmonary effort is normal.     Breath sounds: Normal breath sounds. No wheezing, rhonchi or rales.  Abdominal:     General: Abdomen is flat. Bowel sounds are normal. There is no distension.     Palpations: Abdomen is soft.     Tenderness: There is no abdominal tenderness.  Musculoskeletal:     Right lower leg: No edema.     Left lower leg: No edema.  Skin:    General: Skin is warm and dry.  Neurological:     General: No focal deficit present.     Mental Status: She is alert and oriented to person, place, and time. Mental status is at baseline.     Gait: Gait normal.  Psychiatric:        Mood and Affect: Mood normal.        Behavior: Behavior normal.    ASSESSMENT/PLAN:  Ms Biel is a 70 yo woman now recovered from strongyloidiasis with Hep C, DMT2, and well controlled HTN  Diarrhea Resolved. Patient has now returned to her baseline. Patient prefers to use senna prn once or twice a week in case of constipation. Stool O&P obtained in April, will attempt to obtain results from [facility].  Hyponatremia Will obtain BMP today to assess for improvement since discharge, last Na 134 on day of d/c. Hyponatremia 2/2 diarrhea and vomiting in setting of strongyloidiasis.  Thrombocytopenia ([place]) Will obtain CBC today to assess level. On 3/29 platelet level 106.  Intestinal strongyloidiasis Completed tx with ivermectin and fluconazole. Patient had stool O&P bc patient had diarrhea, now improved with no diarrhea. Will attempt to obtain results.  Chronic viral hepatitis C ([place]) Did not have vaccines (2nd dose Hep B, pneumococcal) available today. Will admin at next visit. Patient to f/u with ID in July.  Non-intractable vomiting Resolved. Due to strongyloidiasis, s/p ivermectin and fluconazole tx.  Type 2 diabetes mellitus (HCC) Likely fair to poor control in hospital due to chronic prednisone, now off. Patient has hgb a1c of 5.2 now,  evidence of very good control. Will cut metformin to 500mg  daily with goal of A1c ~ 7. Will call patient's niece and let her know. Refilled medications and diabetic supplies.  Hypertension Last 3 weeks or so, SBP 135-150, appropriate for age range. Will have patient continue to monitor intermittently at home. No medication needed at this point.  Tachycardia Patient with mild tachycardia to 106 today, repeat 104 after sitting for 10-15 minutes. Patient having no palpitations, dizziness, lightheadedness. Niece reports that she occasionally does have a mildly fast heart rate from 100-109 when vitals are taken for PT. Patient has no complaints today, believes it to be from being rather hot outside. She has not had much water to drink today, no caffeine. Recommend patient increase fluid intake. On exam, normal S1, S2 with regular rhythm.Will follow at next visit in July.     Gladys Damme, MD [place]

## 2019-11-21 DIAGNOSIS — I10 Essential (primary) hypertension: Secondary | ICD-10-CM | POA: Insufficient documentation

## 2019-11-21 DIAGNOSIS — R000 Tachycardia, unspecified: Secondary | ICD-10-CM | POA: Insufficient documentation

## 2019-11-21 LAB — BASIC METABOLIC PANEL
BUN/Creatinine Ratio: 15 (ref 12–28)
BUN: 12 mg/dL (ref 8–27)
CO2: 20 mmol/L (ref 20–29)
Calcium: 9.2 mg/dL (ref 8.7–10.3)
Chloride: 104 mmol/L (ref 96–106)
Creatinine, Ser: 0.79 mg/dL (ref 0.57–1.00)
GFR calc Af Amer: 87 mL/min/{1.73_m2} (ref >59)
GFR calc non Af Amer: 76 mL/min/{1.73_m2} (ref >59)
Glucose: 121 mg/dL — ABNORMAL HIGH (ref 65–99)
Potassium: 4.6 mmol/L (ref 3.5–5.2)
Sodium: 138 mmol/L (ref 134–144)

## 2019-11-21 LAB — CBC
Hematocrit: 38.2 % (ref 34.0–46.6)
Hemoglobin: 12.4 g/dL (ref 11.1–15.9)
MCH: 29 pg (ref 26.6–33.0)
MCHC: 32.5 g/dL (ref 31.5–35.7)
MCV: 89 fL (ref 79–97)
Platelets: 243 10*3/uL (ref 150–450)
RBC: 4.28 x10E6/uL (ref 3.77–5.28)
RDW: 13.8 % (ref 11.7–15.4)
WBC: 7.3 10*3/uL (ref 3.4–10.8)

## 2019-11-21 NOTE — Assessment & Plan Note (Signed)
Last 3 weeks or so, SBP 135-150, appropriate for age range. Will have patient continue to monitor intermittently at home. No medication needed at this point.

## 2019-11-21 NOTE — Assessment & Plan Note (Signed)
Will obtain CBC today to assess level. On 3/29 platelet level 106.

## 2019-11-21 NOTE — Assessment & Plan Note (Signed)
Will obtain BMP today to assess for improvement since discharge, last Na 134 on day of d/c. Hyponatremia 2/2 diarrhea and vomiting in setting of strongyloidiasis.

## 2019-11-21 NOTE — Assessment & Plan Note (Addendum)
Likely fair to poor control in hospital due to chronic prednisone, now off. Patient has hgb a1c of 5.2 now, evidence of very good control. Will cut metformin to 500mg  daily with goal of A1c ~ 7. Will call patient's niece and let her know. Refilled medications and diabetic supplies.

## 2019-11-21 NOTE — Assessment & Plan Note (Signed)
Did not have vaccines (2nd dose Hep B, pneumococcal) available today. Will admin at next visit. Patient to f/u with ID in July.

## 2019-11-21 NOTE — Assessment & Plan Note (Signed)
Resolved. Due to strongyloidiasis, s/p ivermectin and fluconazole tx.

## 2019-11-21 NOTE — Assessment & Plan Note (Addendum)
Patient with mild tachycardia to 106 today, repeat 104 after sitting for 10-15 minutes. Patient having no palpitations, dizziness, lightheadedness. Niece reports that she occasionally does have a mildly fast heart rate from 100-109 when vitals are taken for PT. Patient has no complaints today, believes it to be from being rather hot outside. She has not had much water to drink today, no caffeine. Recommend patient increase fluid intake. On exam, normal S1, S2 with regular rhythm.Will follow at next visit in July.

## 2019-11-21 NOTE — Assessment & Plan Note (Signed)
Completed tx with ivermectin and fluconazole. Patient had stool O&P bc patient had diarrhea, now improved with no diarrhea. Will attempt to obtain results.

## 2019-11-21 NOTE — Assessment & Plan Note (Addendum)
Resolved. Patient has now returned to her baseline. Patient prefers to use senna prn once or twice a week in case of constipation. Stool O&P obtained in April, will attempt to obtain results from [facility].

## 2019-11-23 ENCOUNTER — Telehealth: Payer: Self-pay

## 2019-11-23 DIAGNOSIS — E119 Type 2 diabetes mellitus without complications: Secondary | ICD-10-CM

## 2019-11-23 MED ORDER — ONETOUCH DELICA LANCING DEV MISC: 0 refills | Status: DC

## 2019-11-23 MED ORDER — ONETOUCH VERIO VI STRP: ORAL_STRIP | 12 refills | Status: DC

## 2019-11-23 MED ORDER — ONETOUCH DELICA LANCETS 33G MISC: 0 refills | Status: DC

## 2019-11-23 MED ORDER — ONETOUCH VERIO W/DEVICE KIT: PACK | 0 refills | Status: DC

## 2019-11-23 NOTE — Telephone Encounter (Signed)
Patient's niece calls nurse line regarding issues with receiving medication through pharmacy.   Called and spoke with Sharyl Nimrod at [facility]. Pharmacist states that new rxs for glucometer and supplies need to be sent in with exact directions and dx code. Also, medicare will only cover checking blood sugar three times daily. Resent to [facility] pharmacy.   Pharmacist also states that Metformin (Fortamet), is not covered by insurance. Recommends resending rx as Metformin ER.    Please advise if change is appropriate.   Veronda Prude, RN

## 2019-11-24 ENCOUNTER — Telehealth: Payer: Self-pay

## 2019-11-24 MED ORDER — METFORMIN HCL ER 750 MG PO TB24: 750.0000 mg | ORAL_TABLET | Freq: Every day | ORAL | 3 refills | Status: DC

## 2019-11-24 NOTE — Telephone Encounter (Signed)
Resent metformin as ER, will decrease to 750mg  as this is more likely covered than 500 mg and patient had Hgb A1c of 5.2% last week.  , MD [facility] Family Medicine Residency, PGY-1

## 2019-11-24 NOTE — Telephone Encounter (Signed)
Monique- PT with [facility], calls nurse line to report abnormal VS. Patient's resting vitals: BP: 160/100, HR 112.   Reports that patient is asymptomatic at this time. States that patient used to take BP medications, however, is not currently taking them.   To PCP for next steps  Veronda Prude, RN

## 2019-11-24 NOTE — Telephone Encounter (Signed)
Patient's niece documents all BP's and they range from 135-150 at my most recent visit on Friday with her. A one time elevated BP to 160 while exercising is mildly above goal of <150/90 in a patient of this age range. Will continue to monitor by having it checked regularly and will follow up with results at next visit in July. Adding a medication with overall pattern of good hypertensive control is more likely to lead to hypotension, dizziness, and falls than prevent poor outcomes in this age group.  Shirlean Mylar, MD [facility] Family Medicine Residency, PGY-1

## 2019-12-04 ENCOUNTER — Other Ambulatory Visit: Payer: Self-pay | Admitting: *Deleted

## 2019-12-07 NOTE — Telephone Encounter (Signed)
2nd request. Maribel Luis,CMA  

## 2019-12-08 ENCOUNTER — Telehealth: Payer: Self-pay | Admitting: Family Medicine

## 2019-12-08 NOTE — Telephone Encounter (Signed)
Will forward to MD. Monty Mccarrell,CMA  

## 2019-12-08 NOTE — Telephone Encounter (Signed)
Daughter called because her mother's insurance will not cover the One Touch. Her insurance said that they will cover the Accu-Check. We need to call in the Accu-Check meter and all the supplies that are needed to be able to use. jw

## 2019-12-09 ENCOUNTER — Other Ambulatory Visit: Payer: Self-pay | Admitting: Adult Health

## 2019-12-09 DIAGNOSIS — E1169 Type 2 diabetes mellitus with other specified complication: Secondary | ICD-10-CM

## 2019-12-11 MED ORDER — ACCU-CHEK GUIDE W/DEVICE KIT: 1.0000 | PACK | Freq: Three times a day (TID) | 3 refills | Status: DC

## 2019-12-14 ENCOUNTER — Other Ambulatory Visit: Payer: Self-pay | Admitting: Student in an Organized Health Care Education/Training Program

## 2019-12-24 ENCOUNTER — Ambulatory Visit: Payer: Medicare Other | Admitting: Infectious Diseases

## 2019-12-29 ENCOUNTER — Ambulatory Visit (INDEPENDENT_AMBULATORY_CARE_PROVIDER_SITE_OTHER): Payer: Medicare Other | Admitting: Family Medicine

## 2019-12-29 ENCOUNTER — Encounter: Payer: Self-pay | Admitting: Family Medicine

## 2019-12-29 ENCOUNTER — Other Ambulatory Visit: Payer: Self-pay

## 2019-12-29 VITALS — BP 122/74 | HR 103 | Ht 64.0 in | Wt 184.6 lb

## 2019-12-29 DIAGNOSIS — N3941 Urge incontinence: Secondary | ICD-10-CM

## 2019-12-29 DIAGNOSIS — R3915 Urgency of urination: Secondary | ICD-10-CM

## 2019-12-29 DIAGNOSIS — E559 Vitamin D deficiency, unspecified: Secondary | ICD-10-CM

## 2019-12-29 DIAGNOSIS — D696 Thrombocytopenia, unspecified: Secondary | ICD-10-CM

## 2019-12-29 DIAGNOSIS — Z0000 Encounter for general adult medical examination without abnormal findings: Secondary | ICD-10-CM

## 2019-12-29 DIAGNOSIS — K029 Dental caries, unspecified: Secondary | ICD-10-CM

## 2019-12-29 DIAGNOSIS — Z1321 Encounter for screening for nutritional disorder: Secondary | ICD-10-CM

## 2019-12-29 DIAGNOSIS — I10 Essential (primary) hypertension: Secondary | ICD-10-CM

## 2019-12-29 DIAGNOSIS — E119 Type 2 diabetes mellitus without complications: Secondary | ICD-10-CM

## 2019-12-29 MED ORDER — FESOTERODINE FUMARATE ER 4 MG PO TB24: 4.0000 mg | ORAL_TABLET | Freq: Every day | ORAL | 3 refills | Status: DC

## 2019-12-29 NOTE — Patient Instructions (Addendum)
It was a pleasure to see you!  Today we will check your Vitamin D level. I will let you know the results.  I have represcribed you Toviaz for overactive bladder, but be careful using this with Tramadol as it can cause urinary retention and constipation. I would recommend not using them together (so on days where you need tramadol, don't take Toviaz). If you have urinary retention or constipation, call our office [phone] or go to the emergency department. We have an after hours number where you can reach a doctor 24 hours, 7 days a week.  I recommend you see a dentist for your tooth pain.  For your eyes, I recommend you see an eye doctor (ophthalmology) to check them yearly due to diabetes.  See you in one month and keep up the good work!  Shirlean Mylar, MD

## 2019-12-29 NOTE — Progress Notes (Signed)
SUBJECTIVE:   CHIEF COMPLAINT / HPI: f/u DM  DM: Patient reports that she has had very good control over blood glucose, last several weeks blood glucose checks have ranged from 105-130. No episodes of hypoglycemia or concerns at this time.  Headache: patient reports that she has headache on the left side of her face including her jaw and ear. This can come on at any time and is only minimally relieved by Tylenol. This has started in the last week or so and continues intermittently. The pain is aching, and can become intense. She denies nausea, vomiting, photophobia, aura, thunderclap, or visual changes. During history taking, patient's niece (translating) asks her about her tooth pain from a week ago. Patient had mentioned to her niece that she had tooth pain a week ago, and patient confirms that this headache also makes her left mandible hurt as well.  Ambulation: patient has worked hard on PT and has graduated from a walker to a cane!  Vit D: Patient previously had a vit D deficiency and niece asks how much medication she should take. She uses Vit D3, cholecalciferol, 5000 IU.  Urinary urgency: patient has urinary urgency for several years. She notes that she will register the urge to urinate, but by the time she can get up and head to the bathroom, she has already become incontinent. She had 5 vaginal births in her lifetime. She previously used [place] 8mg  daily, which she said helped her, and she would like to use that again.  Eyes: patient uses glasses and has not seen an ophthalmologist in several years, needs to establish with one in [place].  PERTINENT  PMH / PSH: DM  OBJECTIVE:   BP 122/74   Pulse (!) 103   Ht 5\' 4"  (1.626 m)   Wt 184 lb 9.6 oz (83.7 kg)   SpO2 96%   BMI 31.69 kg/m   Physical Exam Vitals and nursing note reviewed.  Constitutional:      General: She is not in acute distress.    Appearance: Normal appearance. She is obese. She is not ill-appearing,  toxic-appearing or diaphoretic.  HENT:     Head: Normocephalic and atraumatic.     Mouth/Throat:     Mouth: Mucous membranes are moist.     Pharynx: Oropharynx is clear. No oropharyngeal exudate or posterior oropharyngeal erythema.     Comments: Dental caries present on the medial side tooth #17, left mandible Eyes:     Pupils: Pupils are equal, round, and reactive to light.  Cardiovascular:     Rate and Rhythm: Normal rate and regular rhythm.     Pulses: Normal pulses.     Heart sounds: Normal heart sounds.  Pulmonary:     Effort: Pulmonary effort is normal.     Breath sounds: Normal breath sounds.  Abdominal:     General: Abdomen is flat.     Palpations: Abdomen is soft.  Musculoskeletal:     Right lower leg: No edema.     Left lower leg: No edema.  Skin:    General: Skin is warm and dry.     Capillary Refill: Capillary refill takes less than 2 seconds.  Neurological:     General: No focal deficit present.     Mental Status: She is alert and oriented to person, place, and time. Mental status is at baseline.  Psychiatric:        Mood and Affect: Mood normal.        Behavior: Behavior  normal.    ASSESSMENT/PLAN:   Type 2 diabetes mellitus (HCC) Patient continues to do well with her diabetic regimen, no signs of hypoglycemia. Will recheck A1c in 2 months time since metformin decreased 1 month ago. Blood sugar levels appropriate at home.  Thrombocytopenia (HCC) Platelets appropriate at 243 at check in 11/2019  Hypertension Patient continues to have appropriate BP. Goal is <150/100, today BP is 122/74. No symptoms of hyptoension, dizziness, or falls. No need for medication. Will continue to monitor.  Dental caries Patient presented with headache, but actually due to cavity and tooth pain. Referred patient to dentist. Recommend she continue to use tylenol and avoid foods at extremes of temperature.  Vitamin D deficiency Patient previously diagnosed with Vit D deficiency  over the winter and given VitD3 5,000 IU. Because family had question about how much to take  Urinary urgency Long history of urinary urgency that responds well to toviaz in the past. Will prescribe toviaz 4mg  daily and recommend patient avoid taking it on days that she also uses tramadol (usually once a week depending on pain). Patient previously used tramadol and together prior to becoming my patient in March without problem. Recommended caution, patient expresses understanding. Will assess how she does with 4 mg at next appointment in August.  Healthcare maintenance - Referred to ophthalmology with history of DM and wears glasses, has not seen an ophthalmologist in several years. - Will refer patient for mammogram and DEXA scan later this year. Patient has had significant health challenges in the early part of the year, spacing out so as not to overwhelm her or family now that she is recovering.     September, MD [facility]

## 2019-12-30 DIAGNOSIS — K029 Dental caries, unspecified: Secondary | ICD-10-CM | POA: Insufficient documentation

## 2019-12-30 DIAGNOSIS — E559 Vitamin D deficiency, unspecified: Secondary | ICD-10-CM | POA: Insufficient documentation

## 2019-12-30 LAB — VITAMIN D 25 HYDROXY (VIT D DEFICIENCY, FRACTURES): Vit D, 25-Hydroxy: 65.5 ng/mL (ref 30.0–100.0)

## 2019-12-30 NOTE — Assessment & Plan Note (Signed)
Patient previously diagnosed with Vit D deficiency over the winter and given VitD3 5,000 IU. Because family had question about how much to take

## 2019-12-30 NOTE — Assessment & Plan Note (Signed)
Patient continues to do well with her diabetic regimen, no signs of hypoglycemia. Will recheck A1c in 2 months time since metformin decreased 1 month ago. Blood sugar levels appropriate at home.

## 2019-12-30 NOTE — Assessment & Plan Note (Signed)
Patient continues to have appropriate BP. Goal is <150/100, today BP is 122/74. No symptoms of hyptoension, dizziness, or falls. No need for medication. Will continue to monitor.

## 2019-12-30 NOTE — Assessment & Plan Note (Signed)
Patient presented with headache, but actually due to cavity and tooth pain. Referred patient to dentist. Recommend she continue to use tylenol and avoid foods at extremes of temperature.

## 2019-12-30 NOTE — Assessment & Plan Note (Signed)
Platelets appropriate at 243 at check in 11/2019

## 2019-12-31 ENCOUNTER — Encounter: Payer: Self-pay | Admitting: Family Medicine

## 2019-12-31 DIAGNOSIS — Z0000 Encounter for general adult medical examination without abnormal findings: Secondary | ICD-10-CM | POA: Insufficient documentation

## 2019-12-31 DIAGNOSIS — N3281 Overactive bladder: Secondary | ICD-10-CM | POA: Insufficient documentation

## 2019-12-31 NOTE — Assessment & Plan Note (Signed)
Long history of urinary urgency that responds well to toviaz in the past. Will prescribe toviaz 4mg  daily and recommend patient avoid taking it on days that she also uses tramadol (usually once a week depending on pain). Patient previously used tramadol and together prior to becoming my patient in March without problem. Recommended caution, patient expresses understanding. Will assess how she does with 4 mg at next appointment in August.

## 2019-12-31 NOTE — Assessment & Plan Note (Signed)
-   Referred to ophthalmology with history of DM and wears glasses, has not seen an ophthalmologist in several years. - Will refer patient for mammogram and DEXA scan later this year. Patient has had significant health challenges in the early part of the year, spacing out so as not to overwhelm her or family now that she is recovering.

## 2020-01-06 ENCOUNTER — Other Ambulatory Visit: Payer: Self-pay | Admitting: Family Medicine

## 2020-01-16 ENCOUNTER — Other Ambulatory Visit: Payer: Self-pay | Admitting: Family Medicine

## 2020-01-16 DIAGNOSIS — R1084 Generalized abdominal pain: Secondary | ICD-10-CM

## 2020-01-19 ENCOUNTER — Ambulatory Visit (INDEPENDENT_AMBULATORY_CARE_PROVIDER_SITE_OTHER): Payer: Medicare Other | Admitting: Infectious Diseases

## 2020-01-19 ENCOUNTER — Encounter: Payer: Self-pay | Admitting: Infectious Diseases

## 2020-01-19 ENCOUNTER — Other Ambulatory Visit: Payer: Self-pay

## 2020-01-19 VITALS — BP 147/88 | HR 107 | Wt 187.4 lb

## 2020-01-19 DIAGNOSIS — B182 Chronic viral hepatitis C: Secondary | ICD-10-CM

## 2020-01-19 NOTE — Patient Instructions (Addendum)
Please schedule an ultrasound appointment with our team before you leave so we can get another check on your liver since you are not sick anymore.    Please stop by the lab on your way out.   Will plan on having you back in 3 weeks to review test results and get your hepatitis C treatment started.      We need to get a little more information about your hepatitis c infection before we start your treatment. I anticipate that we can get you started in a few weeks after we submit approval to your insurance to ensure payment. We may need to place referral for an ultrasound and/or gastroenterology if your blood work indicates more damage to the liver than expected.     ABOUT HEPATITIS C VIRUS:   Chronic Hepatitis C is the most common blood-borne infection in the [place], affecting approximately 3 million people.   It is the leading cause of cirrhosis, liver cancer, and end stage liver disease requiring transplantation when this infection goes untreated for many years   The majority of people who are infected are unaware because there are not many early symptoms that are specific to this and often go undiagnosed until a specific blood test is drawn.    The hepatitis c virus is passed primarily through direct exposure of contaminated blood or body fluids. It is most efficiently transmitted through repeated exposure to infected blood.   Risk for sexual transmission is very low but is possible if there is high frequency of unprotected sexual activity with known hepatitis c partner or multiple partners of known status.   Over time, approximately 60-70% of people can develop some degree of liver disease. Cirrhosis occurs in 10-20% of those with chronic infection. 1-5% will get liver cancer, which has a very high rate of death.    Approximately 15-25% clear the infection without medication (usually in the first 6 months of becoming exposed to virus)   Newer medications provide over 95%  cure rate when taken as prescribed    IN GENERAL ABOUT DIET  . Persons living with chronic hepatitis c infection should eat a diet to maintain a healthy weight and avoid nutritional deficiencies.   . Completely avoiding alcohol is the best decision for your liver health. If unable to do so please limit alcohol to as little as possible to less than 1 standard drink a day - this is very irritating to your liver.  . Limit tylenol use to less than 2,000 mg daily (two extra strength tablets only twice a day)  . If you have cirrhosis of the liver please take no more than 1,000 mg tylenol a day  . Patients with cirrhosis should not have protein restriction; we recommend a protein intake of approximately 1.2-1.5 g/kg/day.   . For patients with cirrhosis and hepatic encephalopathy, the American Association for the Study of Liver Diseases (AASLD) recommended protein intake is 1.2-1.5 g/kg/day.  . If you experience ascites (fluid accumulation in the abdomen associated with severe liver damage / cirrhosis) please limit sodium intake to < 2000 mg a day    UNTIL YOU HAVE BEEN TREATED AND CURED:  . Use condoms with all sexual encounters or practice abstinence to avoid sexual transmission   . No sharing of razors, toothbrushes, nail clippers or anything that could potentially have blood on it.   . If you cut yourself please clean and cover any wounds or open sores to others do not come into contact  with your blood.   . If blood spills onto item/surface please clean with 1:10 bleach solution and allow to dry, EVEN if it is dried blood.    GENERAL HELPFUL HINTS ON HCV THERAPY:  1. Stay well-hydrated.  2. Notify the [facility] of any changes in your other over-the-counter/herbal or prescription medications.  3. If you miss a dose of your medication, take the missed dose as soon as you remember. Return to your regular time/dose schedule the next day.   4.  Do not stop taking your medications  without first talking with your healthcare provider.  5.  You will see our pharmacist-specialist within the first 2 weeks of starting your medication to monitor for any possible side effects.  6.  You will have blood work once during treatment 4 weeks after your first pill. Again soon after treatment is completed and one final lab 3 months after your last pill to ensure cure!   TIPS TO BE SUCCESSFUL WITH DAILY MEDICATION USE:  1. Set a reminder on your phone  2. Try filling out a pill box for the week - pick a day and put one pill for every day during the week so you know right away if you missed a pill.   3. Have a trusted family member ask you about your medications.   4. Smartphone app

## 2020-01-19 NOTE — Progress Notes (Signed)
Patient: Tracey Riddle  DOB: 01/01/1951 MRN: [id number] PCP: Gladys Damme, MD     Patient Active Problem List   Diagnosis Date Noted  . 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 Urinary urgency 12/31/2019  . Healthcare maintenance 12/31/2019  . Dental caries 12/30/2019  . Vitamin D deficiency 12/30/2019  . Hypertension 11/21/2019  . Tachycardia 11/21/2019  . Abnormal liver CT 08/27/2019  . Type 2 diabetes mellitus ([place]) 08/27/2019  . Hypoproteinemia ([place]) 08/27/2019     Subjective:  Tracey Riddle is a 69 y.o. female here for follow up on strongyloidiasis gastritis and chronic Hepatitis C, Genotype 4.   They declined in person interpretor and prefer her niece to interpret.    HPI: All of her GI complaints with odonyphagia, diarrhea / constipation and bloating pains have resolved now. No stool was ever done to follow up on strongylodiasis gastritis in the SNF prior to D/C from what I hear.   She is ready to start treatment for her chronic hepatitis C infection and has questions about what this means and what this will offer for her with treatment.    Review of Systems  Constitutional: Negative for appetite change, fatigue, fever and unexpected weight change.  Respiratory: Negative for shortness of breath.   Cardiovascular: Negative for chest pain and leg swelling.  Gastrointestinal: Negative for abdominal pain, blood in stool, nausea and vomiting.  Genitourinary: Negative for difficulty urinating and hematuria.  Musculoskeletal: Negative for arthralgias.  Skin: Negative for color change.  Neurological: Negative for dizziness, tremors and headaches.  Hematological: Negative for adenopathy.  Psychiatric/Behavioral: Negative for confusion.     Past Medical History:  Diagnosis Date  . 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  . Diabetes mellitus type II,  controlled ([place]) 08/27/2019  . Hepatitis C     Outpatient Medications Prior to Visit  Medication Sig Dispense Refill  . ASPIRIN LOW DOSE 81 MG EC tablet TAKE 1 TABLET BY MOUTH EVERY DAY 30 tablet 11  . atorvastatin (LIPITOR) 40 MG tablet Take 1 tablet (40 mg total) by mouth daily at 6 PM. 90 tablet 3  . blood glucose meter kit and supplies KIT Dispense based on patient and insurance preference. Use up to four times daily as directed. (FOR ICD-9 250.00, 250.01). 1 each 0  . Blood Glucose Monitoring Suppl (ACCU-CHEK GUIDE) w/Device KIT 1 each by Does not apply route 4 (four) times daily -  before meals and at bedtime. 1 kit 3  . Blood Glucose Monitoring Suppl (ONETOUCH VERIO) w/Device KIT Please use to check blood sugar three times daily. E11.9 1 kit 0  . fesoterodine (TOVIAZ) 4 MG TB24 tablet Take 1 tablet (4 mg total) by mouth daily. 30 tablet 3  . glucose blood (ONETOUCH VERIO) test strip Please use to check blood sugar three times daily. E11.9 100 each 12  . Lancet Devices (ONE TOUCH DELICA LANCING DEV) MISC Please use to check blood sugar three times daily. E11.9 1 each 0  . metFORMIN (GLUCOPHAGE-XR) 750 MG 24 hr tablet Take 1 tablet (750 mg total) by mouth daily with breakfast. 30 tablet 3  . ondansetron (ZOFRAN-ODT) 4 MG disintegrating tablet Take 4 mg by mouth 3 (three) times daily as needed.    [name] 93J MISC Please use to check blood sugar three times daily. E11.9 100 each 0  . pantoprazole (  PROTONIX) 40 MG tablet Take 1 tablet (40 mg total) by mouth 2 (two) times daily. 180 tablet 3  . sennosides-docusate sodium (SENOKOT-S) 8.6-50 MG tablet Take 1 tablet by mouth daily. 30 tablet 3  . traMADol (ULTRAM) 50 MG tablet TAKE 1 TABLET (50 MG TOTAL) BY MOUTH EVERY 8 (EIGHT) HOURS AS NEEDED. (Patient not taking: Reported on 01/19/2020) 15 tablet 1  . acetaminophen (TYLENOL) 500 MG tablet Take 500 mg by mouth every 8 (eight) hours as needed for mild pain or moderate pain.  (Patient not taking: Reported on 01/19/2020)    . hydrocortisone-pramoxine (PROCTOFOAM-HC) rectal foam Place 1 applicator rectally 2 (two) times daily as needed for hemorrhoids. (Patient not taking: Reported on 01/19/2020) 10 g 0  . liver oil-zinc oxide (DESITIN) 40 % ointment Apply 1 application topically daily. (Patient not taking: Reported on 01/19/2020) 56.7 g 0  . Nutritional Supplement LIQD Take 120 mLs by mouth in the morning, at noon, and at bedtime. (Patient not taking: Reported on 01/19/2020)     No facility-administered medications prior to visit.     No Known Allergies  Social History   Tobacco Use  . Smoking status: Never Smoker  . Smokeless tobacco: Never Used  Substance Use Topics  . Alcohol use: Not on file  . Drug use: Never    No family history on file.  Objective:   Vitals:   01/19/20 1523  BP: (!) 147/88  Pulse: (!) 107  SpO2: 94%  Weight: 187 lb 6.4 oz (85 kg)   Body mass index is 32.17 kg/m.  Physical Exam Vitals reviewed.  Constitutional:      Appearance: Normal appearance. She is not ill-appearing.     Comments: She is well appearing and well developed. No walking aid.   HENT:     Mouth/Throat:     Mouth: Mucous membranes are moist.     Pharynx: Oropharynx is clear.  Eyes:     General: No scleral icterus. Cardiovascular:     Rate and Rhythm: Normal rate and regular rhythm.     Pulses: Normal pulses.  Pulmonary:     Effort: Pulmonary effort is normal.  Abdominal:     General: Bowel sounds are normal.     Palpations: Abdomen is soft.     Tenderness: There is no abdominal tenderness.  Musculoskeletal:        General: Normal range of motion.     Right lower leg: No edema.     Left lower leg: No edema.  Skin:    General: Skin is warm and dry.     Coloration: Skin is not jaundiced.  Neurological:     Mental Status: She is oriented to person, place, and time.  Psychiatric:        Mood and Affect: Mood normal.        Thought Content: Thought  content normal.     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 Discussed dramatic reduction in risk for developing liver cancer with treating the viral infection.  Check liver ultrasound and proceed with Q23mHCherry Hills Villagescreenings.  Will also complete lab work up with PT/INR to gauge child pugh score to help determine length of therapy. No findings c/w ascites on exam - will repeat ultrasound today given previous findings of ascites in the setting of unrelated acute illness that was more likely d/t malnutrition.  She will return in 3-4 weeks to review medications, results and decide on best course of therapy.       Relevant Orders   Liver Fibrosis, FibroTest-ActiTest (Completed)   Protime-INR (Completed)   COMPLETE METABOLIC PANEL WITH GFR (Completed)   CBC (Completed)   Hepatitis C RNA quantitative (QUEST) (Completed)   [place]DOMEN RUQ W/ELASTOGRAPHY (Completed)      SJanene Madeira MSN, NP-C RCopelandfor Infectious DClaxtonPager: [phone]: [phone] 01/31/20  11:38 AM

## 2020-01-23 LAB — PROTIME-INR
INR: 1.1
Prothrombin Time: 11.4 s (ref 9.0–11.5)

## 2020-01-23 LAB — CBC
HCT: 39.2 % (ref 35.0–45.0)
Hemoglobin: 12.4 g/dL (ref 11.7–15.5)
MCH: 26.8 pg — ABNORMAL LOW (ref 27.0–33.0)
MCHC: 31.6 g/dL — ABNORMAL LOW (ref 32.0–36.0)
MCV: 84.8 fL (ref 80.0–100.0)
MPV: 12.1 fL (ref 7.5–12.5)
Platelets: 203 10*3/uL (ref 140–400)
RBC: 4.62 10*6/uL (ref 3.80–5.10)
RDW: 14.2 % (ref 11.0–15.0)
WBC: 6.1 10*3/uL (ref 3.8–10.8)

## 2020-01-23 LAB — COMPLETE METABOLIC PANEL WITH GFR
AG Ratio: 1 (calc) (ref 1.0–2.5)
ALT: 43 U/L — ABNORMAL HIGH (ref 6–29)
AST: 52 U/L — ABNORMAL HIGH (ref 10–35)
Albumin: 3.7 g/dL (ref 3.6–5.1)
Alkaline phosphatase (APISO): 90 U/L (ref 37–153)
BUN: 18 mg/dL (ref 7–25)
CO2: 27 mmol/L (ref 20–32)
Calcium: 9.6 mg/dL (ref 8.6–10.4)
Chloride: 105 mmol/L (ref 98–110)
Creat: 0.75 mg/dL (ref 0.60–0.93)
GFR, Est African American: 93 mL/min/{1.73_m2} (ref >=60)
GFR, Est Non African American: 80 mL/min/{1.73_m2} (ref >=60)
Globulin: 3.6 g/dL (calc) (ref 1.9–3.7)
Glucose, Bld: 127 mg/dL — ABNORMAL HIGH (ref 65–99)
Potassium: 4.7 mmol/L (ref 3.5–5.3)
Sodium: 138 mmol/L (ref 135–146)
Total Bilirubin: 0.4 mg/dL (ref 0.2–1.2)
Total Protein: 7.3 g/dL (ref 6.1–8.1)

## 2020-01-23 LAB — LIVER FIBROSIS, FIBROTEST-ACTITEST
ALT: 41 U/L — ABNORMAL HIGH (ref 6–29)
Alpha-2-Macroglobulin: 314 mg/dL — ABNORMAL HIGH (ref 106–279)
Apolipoprotein A1: 161 mg/dL (ref 101–198)
Bilirubin: 0.3 mg/dL (ref 0.2–1.2)
Fibrosis Score: 0.57
GGT: 195 U/L — ABNORMAL HIGH (ref 3–65)
Haptoglobin: 115 mg/dL (ref 43–212)
Necroinflammat ACT Score: 0.3
Reference ID: 3502104

## 2020-01-23 LAB — HEPATITIS C RNA QUANTITATIVE
HCV RNA, PCR, QN (Log): 6.56 log IU/mL — ABNORMAL HIGH
HCV RNA, PCR, QN: 3650000 IU/mL — ABNORMAL HIGH

## 2020-01-25 ENCOUNTER — Ambulatory Visit (HOSPITAL_COMMUNITY)
Admission: RE | Admit: 2020-01-25 | Discharge: 2020-01-25 | Disposition: A | Discharge status: Home / Self Care | Payer: Medicare Other | Source: Ambulatory Visit | Attending: Infectious Diseases | Admitting: Infectious Diseases

## 2020-01-25 ENCOUNTER — Other Ambulatory Visit: Payer: Self-pay

## 2020-01-25 DIAGNOSIS — B182 Chronic viral hepatitis C: Secondary | ICD-10-CM | POA: Diagnosis present

## 2020-01-29 ENCOUNTER — Ambulatory Visit (INDEPENDENT_AMBULATORY_CARE_PROVIDER_SITE_OTHER): Payer: Medicare Other | Admitting: Family Medicine

## 2020-01-29 ENCOUNTER — Encounter: Payer: Self-pay | Admitting: Family Medicine

## 2020-01-29 ENCOUNTER — Other Ambulatory Visit: Payer: Self-pay

## 2020-01-29 VITALS — BP 142/70 | HR 106 | Ht 64.0 in | Wt 185.5 lb

## 2020-01-29 DIAGNOSIS — R937 Abnormal findings on diagnostic imaging of other parts of musculoskeletal system: Secondary | ICD-10-CM | POA: Diagnosis not present

## 2020-01-29 DIAGNOSIS — J302 Other seasonal allergic rhinitis: Secondary | ICD-10-CM

## 2020-01-29 DIAGNOSIS — B182 Chronic viral hepatitis C: Secondary | ICD-10-CM | POA: Diagnosis not present

## 2020-01-29 DIAGNOSIS — K029 Dental caries, unspecified: Secondary | ICD-10-CM

## 2020-01-29 DIAGNOSIS — R3915 Urgency of urination: Secondary | ICD-10-CM | POA: Diagnosis not present

## 2020-01-29 DIAGNOSIS — Z1231 Encounter for screening mammogram for malignant neoplasm of breast: Secondary | ICD-10-CM

## 2020-01-29 DIAGNOSIS — Z780 Asymptomatic menopausal state: Secondary | ICD-10-CM | POA: Diagnosis not present

## 2020-01-29 DIAGNOSIS — E119 Type 2 diabetes mellitus without complications: Secondary | ICD-10-CM

## 2020-01-29 DIAGNOSIS — Z0000 Encounter for general adult medical examination without abnormal findings: Secondary | ICD-10-CM

## 2020-01-29 DIAGNOSIS — E559 Vitamin D deficiency, unspecified: Secondary | ICD-10-CM

## 2020-01-29 DIAGNOSIS — Z13820 Encounter for screening for osteoporosis: Secondary | ICD-10-CM | POA: Diagnosis not present

## 2020-01-29 MED ORDER — FEXOFENADINE HCL 60 MG PO TABS: 60.0000 mg | ORAL_TABLET | Freq: Every day | ORAL | 3 refills | Status: DC

## 2020-01-29 MED ORDER — FLUTICASONE PROPIONATE 50 MCG/ACT NA SUSP: 2.0000 | Freq: Every day | NASAL | 6 refills | Status: DC

## 2020-01-29 NOTE — Patient Instructions (Addendum)
It was great to see you!  1. You no longer need Vit D as your recent Vit D level was 65  2. For your runny nose and allergy symptoms: use flonase spray in each nostril (spray towards eye) once daily, best if used every day for 4-6 weeks. Also you can take allegra once daily  3. Call the breast center to schedule a mammogram and bone density test (DEXA), to be completed at your convenience.  4. Follow up with dentist for tooth removal  5. Follow up with infectious disease to treat hepatitis C  6. Come back in October for a follow up for diabetes  Be Well!  Dr. Leary Roca

## 2020-01-31 NOTE — Assessment & Plan Note (Signed)
Findings concerning for cirrhosis from inpatient imaging - we discussed what this means regarding risk for [facility] and desire for more urgent treatment. Discussed dramatic reduction in risk for developing liver cancer with treating the viral infection.  Check liver ultrasound and proceed with Q81m HCC screenings.  Will also complete lab work up with PT/INR to gauge child pugh score to help determine length of therapy. No findings c/w ascites on exam - will repeat ultrasound today given previous findings of ascites in the setting of unrelated acute illness that was more likely d/t malnutrition.  She will return in 3-4 weeks to review medications, results and decide on best course of therapy.

## 2020-02-02 ENCOUNTER — Other Ambulatory Visit: Payer: Self-pay | Admitting: Adult Health

## 2020-02-02 DIAGNOSIS — R1084 Generalized abdominal pain: Secondary | ICD-10-CM

## 2020-02-05 ENCOUNTER — Other Ambulatory Visit: Payer: Self-pay | Admitting: Family Medicine

## 2020-02-05 DIAGNOSIS — Z1231 Encounter for screening mammogram for malignant neoplasm of breast: Secondary | ICD-10-CM

## 2020-02-05 DIAGNOSIS — E2839 Other primary ovarian failure: Secondary | ICD-10-CM

## 2020-02-09 ENCOUNTER — Other Ambulatory Visit: Payer: Self-pay | Admitting: Family Medicine

## 2020-02-09 DIAGNOSIS — E119 Type 2 diabetes mellitus without complications: Secondary | ICD-10-CM

## 2020-02-09 NOTE — Assessment & Plan Note (Signed)
Foot exam performed today, recommended patient visit her ophthalmologist in next several months, send report. Next A1c check in October.

## 2020-02-09 NOTE — Assessment & Plan Note (Signed)
Patient to f/u with [facility] on 02/16/20 to initiate treatment.

## 2020-02-09 NOTE — Assessment & Plan Note (Signed)
Vit d deficiency resolved. No longer needs supplementation.

## 2020-02-09 NOTE — Assessment & Plan Note (Signed)
Problem improved with toviaz, no SEs.

## 2020-02-09 NOTE — Assessment & Plan Note (Signed)
Patient has appt to remove tooth in 1.5 weeks. If fever, chills, difficulty breathing/chewing/swallowing, gave return precautions.

## 2020-02-09 NOTE — Assessment & Plan Note (Signed)
-   Mammogram ordered - DEXA ordered - Foot exam completed - Recommend see ophthalmologist this year

## 2020-02-09 NOTE — Progress Notes (Signed)
SUBJECTIVE:   CHIEF COMPLAINT / HPI: f/u  DM: No hypoclycemic events, all fasting BG 90-120. Continued good compliance with metformin. Patient to schedule ophthalmology exam. Performed foot exam today.  Dental infection: patient has infected tooth that needs to be removed. Has appointment with dentist for 1.5 weeks from now, earliest available. No fever, chills, redness, swelling, or discharge. Still has pain on that side of her face.   Bone health: Patient's niece denies h/o bone density scan. due to age and use of steroids, recommend DEXA scan.  Breast health: Patient unsure of last mammogram, has not had an abnormal result that she recalls. Recommend annual mammogram screening.  Runny nose: patient reports that she has had a runny nose for 3 weeks with some pressure in her sinuses. She notes that this tends to occur this time of year since she has lived in [place]. She denies fever, chills, sore throat, and cough.  Urinary urgency: patient reports [name] has resolved her problem and she has had no problems urinating.   Vitamin D deficiency: now resolved, no longer requires supplementation.  Hepatitis C: patient is following up with [facility] to initiate treatment for Hep C. Recently had RUQ [place], lab work. Scheduled to return to begin treatment on 02/16/20.  PERTINENT  PMH / PSH: DMT2, HTN, Strongyloides stercoralis  OBJECTIVE:   BP (!) 142/70   Pulse (!) 106   Ht 5\' 4"  (1.626 m)   Wt 84.1 kg   SpO2 99%   BMI 31.84 kg/m   Physical Exam Vitals and nursing note reviewed.  Constitutional:      General: She is not in acute distress.    Appearance: Normal appearance. She is obese. She is not ill-appearing, toxic-appearing or diaphoretic.  HENT:     Head: Normocephalic and atraumatic.     Nose: Rhinorrhea present. No congestion.  Eyes:     Conjunctiva/sclera: Conjunctivae normal.     Pupils: Pupils are equal, round, and reactive to light.  Cardiovascular:     Rate and Rhythm: Normal  rate and regular rhythm.     Pulses: Normal pulses.     Heart sounds: Normal heart sounds.  Pulmonary:     Effort: Pulmonary effort is normal.     Breath sounds: Normal breath sounds.  Skin:    General: Skin is warm and dry.  Neurological:     General: No focal deficit present.     Mental Status: She is alert. Mental status is at baseline.  Psychiatric:        Mood and Affect: Mood normal.        Behavior: Behavior normal.    Diabetic Foot Exam - Simple   Simple Foot Form Diabetic Foot exam was performed with the following findings: Yes 01/29/2020  4:00 PM  Visual Inspection No deformities, no ulcerations, no other skin breakdown bilaterally: Yes Sensation Testing Intact to touch and monofilament testing bilaterally: Yes Pulse Check Posterior Tibialis and Dorsalis pulse intact bilaterally: Yes Comments Normal exam.    ASSESSMENT/PLAN:   Type 2 diabetes mellitus (HCC) Foot exam performed today, recommended patient visit her ophthalmologist in next several months, send report. Next A1c check in October.  Chronic viral hepatitis C (HCC) Patient to f/u with [facility] on 02/16/20 to initiate treatment.   Dental caries Patient has appt to remove tooth in 1.5 weeks. If fever, chills, difficulty breathing/chewing/swallowing, gave return precautions.  Healthcare maintenance - Mammogram ordered - DEXA ordered - Foot exam completed - Recommend see ophthalmologist this  year  Urinary urgency Problem improved with toviaz, no SEs.  Vitamin D deficiency Vit d deficiency resolved. No longer needs supplementation.     Shirlean Mylar, MD [facility]

## 2020-02-16 ENCOUNTER — Encounter: Payer: Self-pay | Admitting: Infectious Diseases

## 2020-02-16 ENCOUNTER — Ambulatory Visit (INDEPENDENT_AMBULATORY_CARE_PROVIDER_SITE_OTHER): Payer: Medicare Other | Admitting: Infectious Diseases

## 2020-02-16 ENCOUNTER — Other Ambulatory Visit: Payer: Self-pay

## 2020-02-16 DIAGNOSIS — E785 Hyperlipidemia, unspecified: Secondary | ICD-10-CM

## 2020-02-16 DIAGNOSIS — Z789 Other specified health status: Secondary | ICD-10-CM | POA: Diagnosis not present

## 2020-02-16 DIAGNOSIS — B182 Chronic viral hepatitis C: Secondary | ICD-10-CM

## 2020-02-16 DIAGNOSIS — E1169 Type 2 diabetes mellitus with other specified complication: Secondary | ICD-10-CM

## 2020-02-16 DIAGNOSIS — K2990 Gastroduodenitis, unspecified, without bleeding: Secondary | ICD-10-CM | POA: Diagnosis not present

## 2020-02-16 MED ORDER — SOFOSBUVIR-VELPATASVIR 400-100 MG PO TABS: 1.0000 | ORAL_TABLET | Freq: Every day | ORAL | 2 refills | Status: DC

## 2020-02-16 MED ORDER — PANTOPRAZOLE SODIUM 40 MG PO TBEC: 40.0000 mg | DELAYED_RELEASE_TABLET | Freq: Every day | ORAL | 3 refills | Status: DC | PRN

## 2020-02-16 MED ORDER — ATORVASTATIN CALCIUM 20 MG PO TABS: 40.0000 mg | ORAL_TABLET | Freq: Every day | ORAL | 2 refills | Status: DC

## 2020-02-16 NOTE — Progress Notes (Signed)
Patient: Tracey Riddle  DOB: October 02, 1950 MRN: [id number] PCP: Gladys Damme, MD     Patient Active Problem List   Diagnosis Date Noted  . Chronic viral hepatitis C ([place]) 09/08/2019    Priority: Medium  . Drug interaction 03/13/2020  . Urinary urgency 12/31/2019  . Healthcare maintenance 12/31/2019  . Dental caries 12/30/2019  . Hypertension 11/21/2019  . Tachycardia 11/21/2019  . Abnormal liver CT 08/27/2019  . Type 2 diabetes mellitus ([place]) 08/27/2019  . Hypoproteinemia ([place]) 08/27/2019     Subjective:  Tracey Riddle is a 69 y.o. female here for follow up on strongyloidiasis gastritis and chronic Hepatitis C, Genotype 4.   They declined in person interpretor and prefer her niece to interpret.    HPI: She continues to feel very well and is without any GI complaints.  Has not had further instruction regarding dosing frequency of her PPI.  Sometimes she forgets to take it and has not noticed much adverse events from that. She is ready to discuss the next steps for hepatitis C treatment and review her recent abdominal ultrasound results.  No changes to her health otherwise.    Review of Systems  Constitutional: Negative for appetite change, fatigue, fever and unexpected weight change.  Respiratory: Negative for shortness of breath.   Cardiovascular: Negative for chest pain and leg swelling.  Gastrointestinal: Negative for abdominal pain, blood in stool, nausea and vomiting.  Genitourinary: Negative for difficulty urinating and hematuria.  Musculoskeletal: Negative for arthralgias.  Skin: Negative for color change.  Neurological: Negative for dizziness, tremors and headaches.  Hematological: Negative for adenopathy.  Psychiatric/Behavioral: Negative for confusion.     Past Medical History:  Diagnosis Date  . Abnormal liver CT 08/27/2019   CT AP (08/26/19, [facility]): Mildly nodular hepatic contour c/w possible mild cirrhosis.  . Diabetes mellitus type II,  controlled ([place]) 08/27/2019  . Hepatitis C     Outpatient Medications Prior to Visit  Medication Sig Dispense Refill  . ASPIRIN LOW DOSE 81 MG EC tablet TAKE 1 TABLET BY MOUTH EVERY DAY 30 tablet 11  . blood glucose meter kit and supplies KIT Dispense based on patient and insurance preference. Use up to four times daily as directed. (FOR ICD-9 250.00, 250.01). 1 each 0  . Blood Glucose Monitoring Suppl (ACCU-CHEK GUIDE) w/Device KIT 1 each by Does not apply route 4 (four) times daily -  before meals and at bedtime. 1 kit 3  . Blood Glucose Monitoring Suppl (ONETOUCH VERIO) w/Device KIT Please use to check blood sugar three times daily. E11.9 1 kit 0  . fesoterodine (TOVIAZ) 4 MG TB24 tablet Take 1 tablet (4 mg total) by mouth daily. 30 tablet 3  . fexofenadine (ALLEGRA ALLERGY) 60 MG tablet Take 1 tablet (60 mg total) by mouth daily. 30 tablet 3  . fluticasone (FLONASE) 50 MCG/ACT nasal spray Place 2 sprays into both nostrils daily. 16 g 6  . glucose blood (ONETOUCH VERIO) test strip Please use to check blood sugar three times daily. E11.9 100 each 12  . hydrocortisone-pramoxine (PROCTOFOAM-HC) rectal foam Place 1 applicator rectally 2 (two) times daily as needed for hemorrhoids. 10 g 0  . Lancet Devices (ONE TOUCH DELICA LANCING DEV) MISC Please use to check blood sugar three times daily. E11.9 1 each 0  . metFORMIN (GLUCOPHAGE-XR) 750 MG 24 hr tablet TAKE 1 TABLET BY MOUTH EVERY DAY WITH BREAKFAST 90 tablet 1  . ondansetron (ZOFRAN-ODT) 4 MG disintegrating tablet Take 4 mg  by mouth 3 (three) times daily as needed.    [name] 20U MISC Please use to check blood sugar three times daily. E11.9 100 each 0  . sennosides-docusate sodium (SENOKOT-S) 8.6-50 MG tablet Take 1 tablet by mouth daily. 30 tablet 3  . traMADol (ULTRAM) 50 MG tablet TAKE 1 TABLET (50 MG TOTAL) BY MOUTH EVERY 8 (EIGHT) HOURS AS NEEDED. (Patient not taking: Reported on 01/19/2020) 15 tablet 1  . atorvastatin  (LIPITOR) 40 MG tablet Take 1 tablet (40 mg total) by mouth daily at 6 PM. 90 tablet 3  . pantoprazole (PROTONIX) 40 MG tablet Take 1 tablet (40 mg total) by mouth 2 (two) times daily. 180 tablet 3  . acetaminophen (TYLENOL) 500 MG tablet Take 500 mg by mouth every 8 (eight) hours as needed for mild pain or moderate pain. (Patient not taking: Reported on 01/19/2020)    . liver oil-zinc oxide (DESITIN) 40 % ointment Apply 1 application topically daily. (Patient not taking: Reported on 01/19/2020) 56.7 g 0  . Nutritional Supplement LIQD Take 120 mLs by mouth in the morning, at noon, and at bedtime. (Patient not taking: Reported on 01/19/2020)     No facility-administered medications prior to visit.     No Known Allergies  Social History   Tobacco Use  . Smoking status: Never Smoker  . Smokeless tobacco: Never Used  Substance Use Topics  . Alcohol use: Not on file  . Drug use: Never    No family history on file.  Objective:   Vitals:   02/16/20 1532  BP: (!) 159/89  Pulse: (!) 107  SpO2: 94%  Weight: 185 lb (83.9 kg)   Body mass index is 31.76 kg/m.  Physical Exam no exam performed today as the entire visit was spent in direct counseling of the patient.  Lab Results: Lab Results  Component Value Date   WBC 6.1 01/19/2020   HGB 12.4 01/19/2020   HCT 39.2 01/19/2020   MCV 84.8 01/19/2020   PLT 203 01/19/2020    Lab Results  Component Value Date   CREATININE 0.75 01/19/2020   BUN 18 01/19/2020   NA 138 01/19/2020   K 4.7 01/19/2020   CL 105 01/19/2020   CO2 27 01/19/2020    Lab Results  Component Value Date   ALT 41 (H) 01/19/2020   ALT 43 (H) 01/19/2020   AST 52 (H) 01/19/2020   GGT 195 (H) 01/19/2020   ALKPHOS 57 09/06/2019   BILITOT 0.4 01/19/2020     Assessment & Plan:   Problem List Items Addressed This Visit      Medium   Chronic viral hepatitis C (Tracey Riddle)    She has findings of cirrhosis on ultrasound with fibrotest indicating F4.  Child Pugh grade a  without signs of decompensation.  We will treat with Epclusa x12 weeks. We will have her come back to meet with Cassie 4 weeks after treatment to check a hepatitis C RNA treatment response. Given her cirrhosis she will need to undergo every 6 month ultrasounds for hepatocellular screening.  I discussed with them today that treating her hepatitis C viral infection will significantly reduce her risk of going on to develop any liver cancer or progression with cirrhosis.  She should follow-up with her GI team at [facility] as well given her cirrhosis for screening EGD and further liver health and management.      Relevant Medications   Sofosbuvir-Velpatasvir (EPCLUSA) 400-100 MG TABS     Unprioritized  Drug interaction    We will need to reduce her statin to only 20 mg during treatment with Epclusa.  This will help avoid any risk for side effects associated with increased atorvastatin levels.  She has not had any primary cardiac and is not diabetic.  We will increase back to previous dose once we completed.  I have asked her to please reduce the use of resolved to as needed once daily.  She may have to take it infrequently for a little while to avoid any refractory GERD given the duration she has been taking it twice a day.       Other Visit Diagnoses    Hyperlipidemia associated with type 2 diabetes mellitus ([place])       Gastritis and duodenitis       Relevant Medications   pantoprazole (PROTONIX) 40 MG tablet      Janene Madeira, MSN, NP-C [facility] for Infectious [place] Pager: [phone] Office: [phone]

## 2020-02-16 NOTE — Patient Instructions (Addendum)
For the Pantoprazole (Protonix) --> please stop taking this and see how your stomach goes. If you have heart burn or stomach pain can resume it once a day as needed. Try to take this 12 hours apart from the Epclusa please.   For your atorvastatin (Lipitor) --> will need to reduce the dose of this during your treatment. Will send in a new dose for you (20 mg tablet).   We will call you once your Hepatitis C medication is ready to start. Hopefully we can get started in a few weeks.   Nice to meet you today!    We need to get a little more information about your hepatitis c infection before we start your treatment. I anticipate that we can get you started in a few weeks after we submit approval to your insurance to ensure payment. We may need to place referral for an ultrasound and/or gastroenterology if your blood work indicates more damage to the liver than expected.     Please come back to see Cassie in 6 weeks.    ABOUT HEPATITIS C VIRUS:   Chronic Hepatitis C is the most common blood-borne infection in the [place], affecting approximately 3 million people.   It is the leading cause of cirrhosis, liver cancer, and end stage liver disease requiring transplantation when this infection goes untreated for many years   The majority of people who are infected are unaware because there are not many early symptoms that are specific to this and often go undiagnosed until a specific blood test is drawn.    The hepatitis c virus is passed primarily through direct exposure of contaminated blood or body fluids. It is most efficiently transmitted through repeated exposure to infected blood.   Risk for sexual transmission is very low but is possible if there is high frequency of unprotected sexual activity with known hepatitis c partner or multiple partners of known status.   Over time, approximately 60-70% of people can develop some degree of liver disease. Cirrhosis occurs in 10-20% of  those with chronic infection. 1-5% will get liver cancer, which has a very high rate of death.    Approximately 15-25% clear the infection without medication (usually in the first 6 months of becoming exposed to virus)   Newer medications provide over 95% cure rate when taken as prescribed    IN GENERAL ABOUT DIET  . Persons living with chronic hepatitis c infection should eat a diet to maintain a healthy weight and avoid nutritional deficiencies.   . Completely avoiding alcohol is the best decision for your liver health. If unable to do so please limit alcohol to as little as possible to less than 1 standard drink a day - this is very irritating to your liver.  . Limit tylenol use to less than 2,000 mg daily (two extra strength tablets only twice a day)  . If you have cirrhosis of the liver please take no more than 1,000 mg tylenol a day  . Patients with cirrhosis should not have protein restriction; we recommend a protein intake of approximately 1.2-1.5 g/kg/day.   . For patients with cirrhosis and hepatic encephalopathy, the American Association for the Study of Liver Diseases (AASLD) recommended protein intake is 1.2-1.5 g/kg/day.  . If you experience ascites (fluid accumulation in the abdomen associated with severe liver damage / cirrhosis) please limit sodium intake to < 2000 mg a day    UNTIL YOU HAVE BEEN TREATED AND CURED:  . Use condoms with  all sexual encounters or practice abstinence to avoid sexual transmission   . No sharing of razors, toothbrushes, nail clippers or anything that could potentially have blood on it.   . If you cut yourself please clean and cover any wounds or open sores to others do not come into contact with your blood.   . If blood spills onto item/surface please clean with 1:10 bleach solution and allow to dry, EVEN if it is dried blood.    GENERAL HELPFUL HINTS ON HCV THERAPY:  1. Stay well-hydrated.  2. Notify the ID Clinic of any changes  in your other over-the-counter/herbal or prescription medications.  3. If you miss a dose of your medication, take the missed dose as soon as you remember. Return to your regular time/dose schedule the next day.   4.  Do not stop taking your medications without first talking with your healthcare provider.  5.  You will see our pharmacist-specialist within the first 2 weeks of starting your medication to monitor for any possible side effects.  6.  You will have blood work once during treatment 4 weeks after your first pill. Again soon after treatment is completed and one final lab 3 months after your last pill to ensure cure!   TIPS TO BE SUCCESSFUL WITH DAILY MEDICATION USE:  1. Set a reminder on your phone  2. Try filling out a pill box for the week - pick a day and put one pill for every day during the week so you know right away if you missed a pill.   3. Have a trusted family member ask you about your medications.   4. Smartphone app      Dorita Fray Instructions:  1. Take Epclusa, one tablet daily with or without food. You should take it at approximately the same time every day. Your treatment will be for 12 weeks. Do not miss a dose.    2. Do not run out of Epclusa! If you are down to one week of medication left and have not heard about your next shipment, please let us know as soon as possible. You will be given 28 days of medication at a time and provided with 2 refills.   3. If you need to start a new medication, prescription from your doctor or over the counter medication, you need to contact us to make sure it does not interfere with Epclusa. There are several medications that can interfere with Epclusa and can make you sick or make the medication not work.  4. If you need to take a medication for acid reflux, your choices are as follows: 1. TUMS or Rolaids separated at least four hours from [place].  2. Pepcid (famotidine) 40mg  up to twice per day administered with Epclusa  or 12 hours apart from .    5. Tylenol (acetominophen) and Advil (ibuprofen) are safe to take with Epclusa if needed for headache, fever, pain.   6. DO NOT stop Epclusa unless instructed to by your provider. If you are hospitalized while taking this medication please bring it with you to the hospital to avoid interruption of therapy. Every pill is important!  7. The most common side effects associated with Epclusa  include:  o Fatigue o Headache o Nausea o Diarrhea o Insomnia

## 2020-02-23 ENCOUNTER — Other Ambulatory Visit: Payer: Self-pay | Admitting: Infectious Diseases

## 2020-02-23 DIAGNOSIS — E785 Hyperlipidemia, unspecified: Secondary | ICD-10-CM

## 2020-03-03 ENCOUNTER — Encounter: Payer: Self-pay | Admitting: Pharmacy Technician

## 2020-03-07 ENCOUNTER — Telehealth: Payer: Self-pay

## 2020-03-07 NOTE — Telephone Encounter (Signed)
[facility] Patient Advocate Encounter   Received notification from [facility] that prior authorization for EPCLUSA is required.   PA submitted on 03/07/2020 Key [id number] Status is pending    [facility] Clinic will continue to follow.   Clearance Coots, CPhT Specialty Pharmacy Patient [facility] for Infectious Disease Phone: [phone] Fax:  [phone]

## 2020-03-08 NOTE — Telephone Encounter (Signed)
[facility] Patient Advocate Encounter  Prior Authorization for [employer]  has been approved.    PA# [id number] Effective dates: 03/08/20 through 12/13//21     [facility] will continue to follow.  Clearance Coots, CPhT Specialty Pharmacy Patient [facility] for Infectious Disease Phone: [phone] Fax:  [phone]

## 2020-03-13 DIAGNOSIS — Z789 Other specified health status: Secondary | ICD-10-CM | POA: Insufficient documentation

## 2020-03-13 NOTE — Assessment & Plan Note (Addendum)
We will need to reduce her statin to only 20 mg during treatment with Epclusa.  This will help avoid any risk for side effects associated with increased atorvastatin levels.  She has not had any primary cardiac and is not diabetic.  We will increase back to previous dose once we completed.  I have asked her to please reduce the use of resolved to as needed once daily.  She may have to take it infrequently for a little while to avoid any refractory GERD given the duration she has been taking it twice a day.

## 2020-03-13 NOTE — Assessment & Plan Note (Signed)
She has findings of cirrhosis on ultrasound with fibrotest indicating F4.  Child Pugh grade a without signs of decompensation.  We will treat with Epclusa x12 weeks. We will have her come back to meet with Cassie 4 weeks after treatment to check a hepatitis C RNA treatment response. Given her cirrhosis she will need to undergo every 6 month ultrasounds for hepatocellular screening.  I discussed with them today that treating her hepatitis C viral infection will significantly reduce her risk of going on to develop any liver cancer or progression with cirrhosis.  She should follow-up with her GI team at [facility] as well given her cirrhosis for screening EGD and further liver health and management.

## 2020-03-28 ENCOUNTER — Other Ambulatory Visit: Payer: Self-pay

## 2020-03-28 DIAGNOSIS — R1084 Generalized abdominal pain: Secondary | ICD-10-CM

## 2020-03-29 ENCOUNTER — Ambulatory Visit: Payer: Medicare Other | Admitting: Pharmacist

## 2020-03-30 MED ORDER — TRAMADOL HCL 50 MG PO TABS: 50.0000 mg | ORAL_TABLET | Freq: Three times a day (TID) | ORAL | 1 refills | Status: DC | PRN

## 2020-04-03 NOTE — Progress Notes (Signed)
HPI: Tracey Riddle is a 69 y.o. female who presents to the [facility] for Hepatitis C follow-up.  Medication: [name]  Start Date: 03/10/20  Hepatitis C Genotype: 4  Fibrosis Score: F4  Hepatitis C RNA: 3.6 million as 01/19/20  Patient Active Problem List   Diagnosis Date Noted  . Drug interaction 03/13/2020  . Urinary urgency 12/31/2019  . Healthcare maintenance 12/31/2019  . Dental caries 12/30/2019  . Hypertension 11/21/2019  . Tachycardia 11/21/2019  . Chronic viral hepatitis C ([place]) 09/08/2019  . Abnormal liver CT 08/27/2019  . Type 2 diabetes mellitus ([place]) 08/27/2019  . Hypoproteinemia ([place]) 08/27/2019    Patient's Medications  New Prescriptions   No medications on file  Previous Medications   ACETAMINOPHEN (TYLENOL) 500 MG TABLET    Take 500 mg by mouth every 8 (eight) hours as needed for mild pain or moderate pain.   ASPIRIN LOW DOSE 81 MG EC TABLET    TAKE 1 TABLET BY MOUTH EVERY DAY   ATORVASTATIN (LIPITOR) 20 MG TABLET    Take 1 tablet (20 mg total) by mouth daily at 6 PM.   BLOOD GLUCOSE METER KIT AND SUPPLIES KIT    Dispense based on patient and insurance preference. Use up to four times daily as directed. (FOR ICD-9 250.00, 250.01).   BLOOD GLUCOSE MONITORING SUPPL (ACCU-CHEK GUIDE) W/DEVICE KIT    1 each by Does not apply route 4 (four) times daily -  before meals and at bedtime.   BLOOD GLUCOSE MONITORING SUPPL (ONETOUCH VERIO) W/DEVICE KIT    Please use to check blood sugar three times daily. E11.9   FESOTERODINE (TOVIAZ) 4 MG TB24 TABLET    Take 1 tablet (4 mg total) by mouth daily.   FEXOFENADINE (ALLEGRA ALLERGY) 60 MG TABLET    Take 1 tablet (60 mg total) by mouth daily.   FLUTICASONE (FLONASE) 50 MCG/ACT NASAL SPRAY    Place 2 sprays into both nostrils daily.   GLUCOSE BLOOD (ONETOUCH VERIO) TEST STRIP    Please use to check blood sugar three times daily. E11.9   HYDROCORTISONE-PRAMOXINE (PROCTOFOAM-HC) RECTAL FOAM    Place 1 applicator  rectally 2 (two) times daily as needed for hemorrhoids.   LANCET DEVICES (ONE TOUCH DELICA LANCING DEV) MISC    Please use to check blood sugar three times daily. E11.9   LIVER OIL-ZINC OXIDE (DESITIN) 40 % OINTMENT    Apply 1 application topically daily.   METFORMIN (GLUCOPHAGE-XR) 750 MG 24 HR TABLET    TAKE 1 TABLET BY MOUTH EVERY DAY WITH BREAKFAST   NUTRITIONAL SUPPLEMENT LIQD    Take 120 mLs by mouth in the morning, at noon, and at bedtime.   ONDANSETRON (ZOFRAN-ODT) 4 MG DISINTEGRATING TABLET    Take 4 mg by mouth 3 (three) times daily as needed.   ONETOUCH DELICA LANCETS 10X MISC    Please use to check blood sugar three times daily. E11.9   PANTOPRAZOLE (PROTONIX) 40 MG TABLET    Take 1 tablet (40 mg total) by mouth daily as needed.   SENNOSIDES-DOCUSATE SODIUM (SENOKOT-S) 8.6-50 MG TABLET    Take 1 tablet by mouth daily.   SOFOSBUVIR-VELPATASVIR (EPCLUSA) 400-100 MG TABS    Take 1 tablet by mouth daily.   TRAMADOL (ULTRAM) 50 MG TABLET    Take 1 tablet (50 mg total) by mouth every 8 (eight) hours as needed.  Modified Medications   No medications on file  Discontinued Medications   No medications on file  Allergies: No Known Allergies  Past Medical History: Past Medical History:  Diagnosis Date  . 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  . Diabetes mellitus type II, controlled ([place]) 08/27/2019  . Hepatitis C     Social History: Social History   Socioeconomic History  . Marital status: Single    Spouse name: Not on file  . Number of children: Not on file  . Years of education: Not on file  . Highest education level: Not on file  Occupational History  . Not on file  Tobacco Use  . Smoking status: Never Smoker  . Smokeless tobacco: Never Used  Substance and Sexual Activity  . Alcohol use: Not on file  . Drug use: Never  . Sexual activity: Never  Other Topics Concern  . Not on file    Social History Narrative  . Not on file   Social Determinants of Health   Financial Resource Strain:   . Difficulty of Paying Living Expenses: Not on file  Food Insecurity:   . Worried About Charity fundraiser in the Last Year: Not on file  . Ran Out of Food in the Last Year: Not on file  Transportation Needs:   . Lack of Transportation (Medical): Not on file  . Lack of Transportation (Non-Medical): Not on file  Physical Activity:   . Days of Exercise per Week: Not on file  . Minutes of Exercise per Session: Not on file  Stress:   . Feeling of Stress : Not on file  Social Connections:   . Frequency of Communication with Friends and Family: Not on file  . Frequency of Social Gatherings with Friends and Family: Not on file  . Attends Religious Services: Not on file  . Active Member of Clubs or Organizations: Not on file  . Attends Archivist Meetings: Not on file  . Marital Status: Not on file    Labs: Hepatitis C Lab Results  Component Value Date   HCVGENOTYPE 4 09/04/2019   HCVRNAPCRQN 3,650,000 (H) 01/19/2020   FIBROSTAGE F2 01/19/2020   Hepatitis B Lab Results  Component Value Date   HEPBSAG NON REACTIVE 08/27/2019   Hepatitis A No results found for: HAV HIV No results found for: HIV Lab Results  Component Value Date   CREATININE 0.75 01/19/2020   CREATININE 0.79 11/20/2019   CREATININE 0.4 (A) 09/23/2019   CREATININE 0.60 09/14/2019   CREATININE 0.62 09/13/2019   Lab Results  Component Value Date   AST 52 (H) 01/19/2020   AST 31 09/06/2019   AST 24 09/05/2019   ALT 41 (H) 01/19/2020   ALT 43 (H) 01/19/2020   ALT 29 09/06/2019   INR 1.1 01/19/2020   INR 1.2 08/28/2019    Assessment:  They declined in person interpretor and prefer her niece to interpret.  Tracey Riddle presents today in good spirits with her niece for her 4-week follow-up visit after starting Epclusa for HCV management on 03/10/20. Initial RNA VL of 3.6 million, genotype 4,  and findings of cirrhosis found on ultrasound with fibrotest indicating F4; Child Pugh grade a without signs of decompensation. Today, she reports taking Epclusa 1 tablet daily in the mornings with breakfast. Denies missing any doses or side-effects. Reports starting atorvastatin 20 mg daily yesterday and denies myalgias. Reports stopping pantoprazole and denies any new medications. Will check HCV viral load and CMET today. Will also schedule lab appointment for  HCV viral load in 8 weeks for EOT and schedule SVR cure visit with Colletta [place] 12 weeks after EOT.    Plan: - Continue Epclusa 1 tablet daily with food  - Labs: Hep C viral load and CMET - F/u in 8 weeks for end of therapy labs  - F/u 12 weeks after EOT with Byrd Hesselbach, PharmD PGY2 Ambulatory Care Resident [place]

## 2020-04-04 ENCOUNTER — Other Ambulatory Visit: Payer: Self-pay

## 2020-04-04 ENCOUNTER — Ambulatory Visit (INDEPENDENT_AMBULATORY_CARE_PROVIDER_SITE_OTHER): Payer: Medicare Other | Admitting: Pharmacist

## 2020-04-04 DIAGNOSIS — Z79899 Other long term (current) drug therapy: Secondary | ICD-10-CM | POA: Diagnosis not present

## 2020-04-04 DIAGNOSIS — B182 Chronic viral hepatitis C: Secondary | ICD-10-CM | POA: Diagnosis not present

## 2020-04-04 NOTE — Addendum Note (Signed)
Addended by: Robinette Haines on: 04/04/2020 04:45 PM   Modules accepted: Level of Service

## 2020-04-06 LAB — COMPREHENSIVE METABOLIC PANEL
AG Ratio: 1.2 (calc) (ref 1.0–2.5)
ALT: 41 U/L — ABNORMAL HIGH (ref 6–29)
AST: 40 U/L — ABNORMAL HIGH (ref 10–35)
Albumin: 4.1 g/dL (ref 3.6–5.1)
Alkaline phosphatase (APISO): 92 U/L (ref 37–153)
BUN: 13 mg/dL (ref 7–25)
CO2: 26 mmol/L (ref 20–32)
Calcium: 9.8 mg/dL (ref 8.6–10.4)
Chloride: 104 mmol/L (ref 98–110)
Creat: 0.74 mg/dL (ref 0.60–0.93)
Globulin: 3.4 g/dL (calc) (ref 1.9–3.7)
Glucose, Bld: 113 mg/dL — ABNORMAL HIGH (ref 65–99)
Potassium: 4.5 mmol/L (ref 3.5–5.3)
Sodium: 139 mmol/L (ref 135–146)
Total Bilirubin: 0.4 mg/dL (ref 0.2–1.2)
Total Protein: 7.5 g/dL (ref 6.1–8.1)

## 2020-04-06 LAB — HEPATITIS C RNA QUANTITATIVE
HCV RNA, PCR, QN (Log): 1.26 log IU/mL — ABNORMAL HIGH
HCV RNA, PCR, QN: 18 IU/mL — ABNORMAL HIGH

## 2020-04-17 ENCOUNTER — Other Ambulatory Visit: Payer: Self-pay | Admitting: Family Medicine

## 2020-04-17 DIAGNOSIS — N3941 Urge incontinence: Secondary | ICD-10-CM

## 2020-04-18 ENCOUNTER — Other Ambulatory Visit: Payer: Self-pay

## 2020-04-18 ENCOUNTER — Ambulatory Visit (INDEPENDENT_AMBULATORY_CARE_PROVIDER_SITE_OTHER): Payer: Medicare Other | Admitting: Family Medicine

## 2020-04-18 VITALS — BP 168/100 | HR 90 | Ht 64.0 in | Wt 182.8 lb

## 2020-04-18 DIAGNOSIS — R002 Palpitations: Secondary | ICD-10-CM | POA: Diagnosis not present

## 2020-04-18 DIAGNOSIS — R42 Dizziness and giddiness: Secondary | ICD-10-CM | POA: Insufficient documentation

## 2020-04-18 NOTE — Progress Notes (Signed)
SUBJECTIVE:   CHIEF COMPLAINT / HPI:   Dizzy Spells 3 days Happens when she goes from sitting to standing, worse in the morning when she wakes and gets out of bed Starts to improve after standing for a little while She does not feel weak, just afraid that she will fall due to the dizziness No dizziness at rest No other problems in the last three days Last night she was complaining that her heart was beating hard, thinks it was about 20 min, drank a little water and it seemed to help Never had this before and hasn't happened again Yesterday her BP was high, DBP was 106, but can't remember SBP She used to take BP medication but doesn't now No syncope or falls, no head trauma No changes in hearing or vision  Last CMP on 10/18 was within normal limits aside from slightly elevated AST/ALT in the setting of chronic hepatitis C  PERTINENT  PMH / PSH: T2DM, chronic viral hepatitis C follows with infectious disease, vitamin D deficiency, HTN, Strongyloides stercoralis  OBJECTIVE:   BP (!) 168/100   Pulse 90   Ht 5\' 4"  (1.626 m)   Wt 182 lb 12.8 oz (82.9 kg)   SpO2 98%   BMI 31.38 kg/m    Orthostatic VS for the past 24 hrs:  BP- Lying Pulse- Lying BP- Sitting Pulse- Sitting BP- Standing at 0 minutes Pulse- Standing at 0 minutes  04/18/20 1650 184/90 86 (!) 158/92 85 (!) 150/100 92     Physical Exam:  General: 69 y.o. female in NAD HEENT: PERRL, EOMI, negative Dix-Hallpike Cardio: RRR no m/r/g Lungs: CTAB, no wheezing, no rhonchi, no crackles, no IWOB on RA Skin: warm and dry Extremities: No edema Neuro: CN II through XII grossly intact, sensation intact to BUE, decreased sensation LLE compared to RLE which is chronic and stable per patient, negative finger-to-nose and heel-to-shin Gait: Ambulates well with cane, uses minimally, no obvious unsteady gait   ASSESSMENT/PLAN:   Dizziness Her history and orthostatic vitals are consistent with orthostatic hypotension, however  patient is having rather elevated blood pressures and is not on any antihypertensive medications.  Given that she is so symptomatic, would not start her on an antihypertensive at this time.  We will go ahead and schedule her for ambulatory blood pressure monitoring and have her follow-up with PCP in 1 week to further discuss.  She has had normal labs within the last month, had a normal CBC in August.  She does not seem to be having symptoms consistent with vertigo and had a negative Dix-Hallpike.  Her symptoms only occur when going from sitting to standing, not with moving her head when she is at rest, therefore this lends more towards orthostatic hypotension.  She did have a significant drop, however this is unclear as to why.  She does not have a murmur on examination making aortic stenosis much less likely.  Does not seem to have any nausea or vomiting, presyncope.  Discussed with Dr. September, who agrees that referral to cardiology would be the best option as well as ambulatory blood pressure monitoring.  Advised her to stay hydrated, take her time when changing positions, and follow-up in 1 week with PCP.  Palpitations Reported history last night, episode lasting 20 minutes.  Her cardiac exam today is within normal limits.  No obvious murmur.  She had a normal TSH in March, therefore would hold off on repeating at this time.  Plan per above for dizziness.  This may be associated with it, could also consider dehydration given that she said her symptoms improved with drinking water.  Given her age, discussed with Dr. Deirdre Priest, will go ahead and refer her to cardiology.     Unknown Jim, DO [facility]

## 2020-04-18 NOTE — Assessment & Plan Note (Addendum)
Her history and orthostatic vitals are consistent with orthostatic hypotension, however patient is having rather elevated blood pressures and is not on any antihypertensive medications.  Given that she is so symptomatic, would not start her on an antihypertensive at this time.  We will go ahead and schedule her for ambulatory blood pressure monitoring and have her follow-up with PCP in 1 week to further discuss.  She has had normal labs within the last month, had a normal CBC in August.  She does not seem to be having symptoms consistent with vertigo and had a negative Dix-Hallpike.  Her symptoms only occur when going from sitting to standing, not with moving her head when she is at rest, therefore this lends more towards orthostatic hypotension.  She did have a significant drop, however this is unclear as to why.  She does not have a murmur on examination making aortic stenosis much less likely.  Does not seem to have any nausea or vomiting, presyncope.  Discussed with Dr. Deirdre Priest, who agrees that referral to cardiology would be the best option as well as ambulatory blood pressure monitoring.  Advised her to stay hydrated, take her time when changing positions, and follow-up in 1 week with PCP.

## 2020-04-18 NOTE — Assessment & Plan Note (Signed)
Reported history last night, episode lasting 20 minutes.  Her cardiac exam today is within normal limits.  No obvious murmur.  She had a normal TSH in March, therefore would hold off on repeating at this time.  Plan per above for dizziness.  This may be associated with it, could also consider dehydration given that she said her symptoms improved with drinking water.  Given her age, discussed with Dr. Deirdre Priest, will go ahead and refer her to cardiology.

## 2020-04-18 NOTE — Patient Instructions (Signed)
Thank you for coming to see me today. It was a pleasure. Today we talked about:   Please make an appointment with Dr. Raymondo Band for ambulatory blood pressure monitoring, therefore we can see what her blood pressure does throughout the day.  I have placed a referral to cardiology for her dizziness and heart pounding.  If you do not hear from them in the next 2 weeks, please give [place] a call.  Please follow-up with PCP in 1 week.  If you have any questions or concerns, please do not hesitate to call the office at [phone].  Best,   Luis Abed, DO

## 2020-04-21 ENCOUNTER — Other Ambulatory Visit: Payer: Self-pay

## 2020-04-21 ENCOUNTER — Ambulatory Visit (INDEPENDENT_AMBULATORY_CARE_PROVIDER_SITE_OTHER): Payer: Medicare Other | Admitting: Pharmacist

## 2020-04-21 ENCOUNTER — Encounter: Payer: Self-pay | Admitting: Pharmacist

## 2020-04-21 DIAGNOSIS — I10 Essential (primary) hypertension: Secondary | ICD-10-CM

## 2020-04-21 NOTE — Patient Instructions (Addendum)
Blood pressure test showed elevated readings.  Please START amlodipine 2.5mg  once daily at NIGHT.    Follow-up with Dr. Leary Roca

## 2020-04-21 NOTE — Progress Notes (Signed)
   S:    Patient arrives in good spirits with her niece as the intepreter. Presents to the clinic for ambulatory blood pressure evaluation.   Patient was referred and last seen by Primary Care Provider on 04/18/2020.    Diagnosed with Hypertension in 11/2019.    Reports dizziness that started ~2 weeks ago and has been occurring everyday. Reports dizziness occurs when standing and resolves when sitting and resting. Patient unable to check BP when dizziness occurs because niece is usually not home at the time. Reports no changes in medications, food, or lifestyle habits.   Medication compliance is reported to be good.  Discussed procedure for wearing the monitor and gave patient written instructions. Monitor was placed on non-dominant arm with instructions to return in the morning.   Current BP Medications include:  none  O:   Physical Exam Neurological:     Mental Status: She is alert.  Psychiatric:        Mood and Affect: Mood normal.        Behavior: Behavior normal.        Judgment: Judgment normal.    Review of Systems  All other systems reviewed and are negative.   Last 3 Office BP readings: BP Readings from Last 3 Encounters:  04/18/20 (!) 168/100  02/16/20 (!) 159/89  01/29/20 (!) 142/70     Clinical Atherosclerotic Cardiovascular Disease (ASCVD): No  The ASCVD Risk score Denman George DC Jr., et al., 2013) failed to calculate for the following reasons:   The patient has a prior MI or stroke diagnosis  Basic Metabolic Panel    Component Value Date/Time   NA 139 04/04/2020 1108   NA 138 11/20/2019 1710   K 4.5 04/04/2020 1108   CL 104 04/04/2020 1108   CO2 26 04/04/2020 1108   GLUCOSE 113 (H) 04/04/2020 1108   BUN 13 04/04/2020 1108   BUN 12 11/20/2019 1710   CREATININE 0.74 04/04/2020 1108   CALCIUM 9.8 04/04/2020 1108   GFRNONAA 80 01/19/2020 1551   GFRAA 93 01/19/2020 1551    Renal function: Estimated Creatinine Clearance: 65.7 mL/min (by C-G formula based on  SCr of 0.74 mg/dL).   ABPM Study Data: Arm Placement left arm   Overall Mean 24hr BP:   151/87 mmHg HR: 83   Daytime Mean BP:  158/90 mmHg HR: 86   Nighttime Mean BP:  143/83 mmHg HR: 80   Dipping Pattern: Yes.    Sys:   9.2%   Dia: 8.6   [normal dipping ~10-20%]  Non-hypertensive ABPM thresholds: daytime BP <125/75 mmHg, sleeptime BP <120/70 mmHg     A/P: History of hypertension since 11/2019. 24-hour ambulatory blood pressure demonstrates uncontrolled hypertension with an average blood pressure of 151/87 mmHg, and a nocturnal dipping pattern that is normal.  Not currently taking blood pressure medications at home.  Per niece, has tried a blood pressure medication in the past but unsure of medication name.  Reports some mild lower extremity swelling in the past but negative on exam.  -Start amlodipine 2.5mg  once daily at night -Patient and niece aware to contact provider if swelling occurs  Results reviewed and written information provided.  Total time in face-to-face counseling 15 minutes.    F/U Clinic Visit with Dr. Leary Roca in 2 weeks.  Patient seen with Rexford Maus, PharmD - PGY-1 Resident and Fabio Neighbors, PharmD, PGY2 Pharmacy Resident.

## 2020-04-22 MED ORDER — AMLODIPINE BESYLATE 2.5 MG PO TABS: 2.5000 mg | ORAL_TABLET | Freq: Every day | ORAL | 5 refills | Status: DC

## 2020-04-22 NOTE — Assessment & Plan Note (Signed)
History of hypertension since 11/2019. 24-hour ambulatory blood pressure demonstrates uncontrolled hypertension with an average blood pressure of 151/87 mmHg, and a nocturnal dipping pattern that is normal.  Not currently taking blood pressure medications at home.  Per niece, has tried a blood pressure medication in the past but unsure of medication name.  Reports some mild lower extremity swelling in the past but negative on exam.  -Start amlodipine 2.5mg  once daily at night -Patient and niece aware to contact provider if swelling occurs

## 2020-04-24 ENCOUNTER — Other Ambulatory Visit: Payer: Self-pay | Admitting: Infectious Diseases

## 2020-04-24 DIAGNOSIS — E1169 Type 2 diabetes mellitus with other specified complication: Secondary | ICD-10-CM

## 2020-04-24 DIAGNOSIS — E785 Hyperlipidemia, unspecified: Secondary | ICD-10-CM

## 2020-04-25 NOTE — Progress Notes (Signed)
Reviewed: I agree with Dr. Koval's documentation and management. 

## 2020-05-04 ENCOUNTER — Other Ambulatory Visit: Payer: Self-pay

## 2020-05-04 ENCOUNTER — Encounter: Payer: Self-pay | Admitting: Family Medicine

## 2020-05-04 ENCOUNTER — Ambulatory Visit (INDEPENDENT_AMBULATORY_CARE_PROVIDER_SITE_OTHER): Payer: Medicare Other | Admitting: Family Medicine

## 2020-05-04 VITALS — BP 138/78 | HR 85 | Ht 63.0 in | Wt 183.6 lb

## 2020-05-04 DIAGNOSIS — I10 Essential (primary) hypertension: Secondary | ICD-10-CM

## 2020-05-04 DIAGNOSIS — E119 Type 2 diabetes mellitus without complications: Secondary | ICD-10-CM | POA: Diagnosis not present

## 2020-05-04 DIAGNOSIS — M1711 Unilateral primary osteoarthritis, right knee: Secondary | ICD-10-CM | POA: Insufficient documentation

## 2020-05-04 DIAGNOSIS — N3941 Urge incontinence: Secondary | ICD-10-CM

## 2020-05-04 DIAGNOSIS — N3281 Overactive bladder: Secondary | ICD-10-CM | POA: Diagnosis not present

## 2020-05-04 DIAGNOSIS — Z23 Encounter for immunization: Secondary | ICD-10-CM

## 2020-05-04 DIAGNOSIS — Z0000 Encounter for general adult medical examination without abnormal findings: Secondary | ICD-10-CM | POA: Diagnosis not present

## 2020-05-04 DIAGNOSIS — M1712 Unilateral primary osteoarthritis, left knee: Secondary | ICD-10-CM | POA: Diagnosis not present

## 2020-05-04 DIAGNOSIS — R350 Frequency of micturition: Secondary | ICD-10-CM

## 2020-05-04 DIAGNOSIS — R42 Dizziness and giddiness: Secondary | ICD-10-CM

## 2020-05-04 LAB — POCT URINALYSIS DIP (MANUAL ENTRY)
Bilirubin, UA: NEGATIVE
Blood, UA: NEGATIVE
Glucose, UA: NEGATIVE mg/dL
Ketones, POC UA: NEGATIVE mg/dL
Nitrite, UA: POSITIVE — AB
Protein Ur, POC: NEGATIVE mg/dL
Spec Grav, UA: 1.02 (ref 1.010–1.025)
Urobilinogen, UA: 0.2 E.U./dL
pH, UA: 7.5 (ref 5.0–8.0)

## 2020-05-04 LAB — POCT GLYCOSYLATED HEMOGLOBIN (HGB A1C): Hemoglobin A1C: 5.4 % (ref 4.0–5.6)

## 2020-05-04 MED ORDER — FESOTERODINE FUMARATE ER 8 MG PO TB24: 8.0000 mg | ORAL_TABLET | Freq: Every day | ORAL | 1 refills | Status: DC

## 2020-05-04 MED ORDER — DICLOFENAC SODIUM 1 % EX GEL: 4.0000 g | Freq: Four times a day (QID) | CUTANEOUS | 3 refills | Status: DC

## 2020-05-04 NOTE — Assessment & Plan Note (Signed)
Hypertension: good control, no changes - Medications: amlodipine 2.5mg  - Compliance: good - Checking BP at home: yes, at goal <150/90 - Denies any SOB, CP, vision changes, LE edema, medication SEs, or symptoms of hypotension - Diet: diabetic diet - Exercise: some walking

## 2020-05-04 NOTE — Patient Instructions (Addendum)
It was a pleasure to see you as always!  1. Today we are checking a hemoglobin A1c and urine tests. I will let you know the results when they return. I will call you tomorrow with results from the A1c and whether or not to decrease metformin.  2. For the knee pain: I recommend you use voltaren gel for knee pain. You can also use salonpas patches at night for pain. If it does not improve like this, we can follow up to get an xray.  3. Keep up the good work with managing your diabetes and blood pressure. Goal for blood pressure is less than 150/90. If you do consistently have BP greater than 150/90 or significant leg swelling that doesn't get better, let me know.  4. You can get the pneumonia vaccine and covid booster if you would like whenever is convenient for you.  5. Follow up in 6 months for sure to check in on diabetes, and earlier if any new problems arise or we need to follow up on new symptoms from an old problem.  6. Increase toviaz to 8 mg daily. Please let me know if you have any problems with it.  Be Well!  Dr. Leary Roca

## 2020-05-04 NOTE — Assessment & Plan Note (Signed)
Well controlled. Can decrease metformin to 500 mg daily. Current Regimen: 750 mg metformin--> decrease to 500 mg daily CBGs: Well controlled 88-130 Last A1c: 5.4% today  Denies polyuria, polydipsia, hypoglycemia Last Eye Exam: has not had one in [place]. Reminded to do again. Statin: Atorvastatin ACE/ARB: not necessary in patient with good control of DM and very mild HTN

## 2020-05-04 NOTE — Assessment & Plan Note (Signed)
>>  ASSESSMENT AND PLAN FOR OSTEOARTHRITIS OF LEFT KNEE WRITTEN ON 05/04/2020  5:34 PM BY MAHONEY, CAITLIN, MD  Given type of pain associated with changing position and patient's demographics, most likely cause is osteoarthritis.  Because pain is overall manageable, patient would prefer not to get an x-ray today but to use topical NSAID.  Prescription for Voltaren gel given.  If patient continues to have problems, has worsening swelling or pain, she is to return for exam.  Recommend obtaining plain film imaging at that time.

## 2020-05-04 NOTE — Assessment & Plan Note (Signed)
Patient has increased symptoms of overactive bladder, uses bathroom 5 times between dinner and bedtime, cannot always make it to bathroom. Has tolerated toviaz 8 mg in the past. Will increase to 8 mg today. Will obtain UA and urine culture to ensure no UTI contributing to symptoms.

## 2020-05-04 NOTE — Assessment & Plan Note (Signed)
Given type of pain associated with changing position and patient's demographics, most likely cause is osteoarthritis.  Because pain is overall manageable, patient would prefer not to get an x-ray today but to use topical NSAID.  Prescription for Voltaren gel given.  If patient continues to have problems, has worsening swelling or pain, she is to return for exam.  Recommend obtaining plain film imaging at that time.

## 2020-05-04 NOTE — Assessment & Plan Note (Signed)
Patient reports that dizziness has resolved and has not experienced it further.

## 2020-05-04 NOTE — Assessment & Plan Note (Signed)
Reminded patient to see ophthalmologist and send report to me. DEXA and mammogram scheduled for 11/24. Getting flu shot today.

## 2020-05-04 NOTE — Progress Notes (Signed)
SUBJECTIVE:   CHIEF COMPLAINT / HPI:   HTN f/u: Taking amlodipine, no leg swelling. BP has been well controlled, no highs greater than 140/90 at home.  DMT2: Fasting numbers for last month range between 88-123. Patient compliant with metformin.  Knee: Patient reports aching pain in left knee that occurs whenever she changes position, particularly going from sitting or laying to standing.  The pain is present momentarily gets better after a few minutes after changing position.  It is present whenever she changes position no matter time of day.  She reports no history of trauma.  Frequency urination: patient started back on toviaz 4mg  qhs over the summer with improvement. Prior to March 2021 she was using 8mg  without problem. After dinner and before bed she reports urinating up to 5 times and she cannot always make it to the restroom. She describes symptoms as "overactive bladder" and getting cues to frequently. Denies dysuria. Patient would like to increase toviaz to 8mg  as she has tolerated this well in the past.  PERTINENT  PMH / PSH: controlled DMT2, HTN  OBJECTIVE:   BP 138/78   Pulse 85   Ht 5\' 3"  (1.6 m)   Wt 183 lb 9.6 oz (83.3 kg)   SpO2 97%   BMI 32.52 kg/m   HEENT: Sclera without injection or icterus. MMM. External auditory canal examined and WNL. TM normal appearance, no erythema or bulging. Neck: Supple.  Cardiac: Regular rate and rhythm. Normal S1/S2. No murmurs, rubs, or gallops appreciated. Lungs: Clear bilaterally to ascultation.  Ext: left knee- Inspection was negative for erythema, ecchymosis, Very mild effusion on inferior, medial aspect to patella. No obvious bony abnormalities or signs of osteophyte development. Palpation yielded no asymmetric warmth; No joint line tenderness; No condyle tenderness; No patellar tenderness; No patellar crepitus. No obvious Baker's cyst development. ROM normal in flexion (135 degrees) and extension (0 degrees). Normal hamstring and  quadriceps strength. Neurovascularly intact bilaterally. Special test: McMurray's positive on right for crepitus, lachman's negative bilaterallly, anterior drawer negative bilaterally. Psych: Pleasant and appropriate   Niece April 2021 present for interpretation to Kinyarwanda per patient's preference ASSESSMENT/PLAN:   Type 2 diabetes mellitus (HCC) Well controlled. Can decrease metformin to 500 mg daily. Current Regimen: 750 mg metformin--> decrease to 500 mg daily CBGs: Well controlled 88-130 Last A1c: 5.4% today  Denies polyuria, polydipsia, hypoglycemia Last Eye Exam: has not had one in [place]. Reminded to do again. Statin: Atorvastatin ACE/ARB: not necessary in patient with good control of DM and very mild HTN    Overactive bladder Patient has increased symptoms of overactive bladder, uses bathroom 5 times between dinner and bedtime, cannot always make it to bathroom. Has tolerated toviaz 8 mg in the past. Will increase to 8 mg today. Will obtain UA and urine culture to ensure no UTI contributing to symptoms.   Healthcare maintenance Reminded patient to see ophthalmologist and send report to me. DEXA and mammogram scheduled for 11/24. Getting flu shot today.  Dizziness Patient reports that dizziness has resolved and has not experienced it further.  Hypertension Hypertension: good control, no changes - Medications: amlodipine 2.5mg  - Compliance: good - Checking BP at home: yes, at goal <150/90 - Denies any SOB, CP, vision changes, LE edema, medication SEs, or symptoms of hypotension - Diet: diabetic diet - Exercise: some walking   Osteoarthritis of left knee Given type of pain associated with changing position and patient's demographics, most likely cause is osteoarthritis.  Because pain is overall manageable,  patient would prefer not to get an x-ray today but to use topical NSAID.  Prescription for Voltaren gel given.  If patient continues to have problems, has worsening swelling  or pain, she is to return for exam.  Recommend obtaining plain film imaging at that time.     Shirlean Mylar, MD [facility]

## 2020-05-06 ENCOUNTER — Encounter: Payer: Self-pay | Admitting: Internal Medicine

## 2020-05-06 ENCOUNTER — Other Ambulatory Visit: Payer: Self-pay

## 2020-05-06 ENCOUNTER — Ambulatory Visit (INDEPENDENT_AMBULATORY_CARE_PROVIDER_SITE_OTHER): Payer: Medicare Other | Admitting: Internal Medicine

## 2020-05-06 ENCOUNTER — Telehealth: Payer: Self-pay | Admitting: Radiology

## 2020-05-06 VITALS — BP 160/80 | HR 91 | Ht 63.0 in | Wt 182.0 lb

## 2020-05-06 DIAGNOSIS — E119 Type 2 diabetes mellitus without complications: Secondary | ICD-10-CM | POA: Insufficient documentation

## 2020-05-06 DIAGNOSIS — R002 Palpitations: Secondary | ICD-10-CM | POA: Diagnosis not present

## 2020-05-06 DIAGNOSIS — I10 Essential (primary) hypertension: Secondary | ICD-10-CM | POA: Diagnosis not present

## 2020-05-06 DIAGNOSIS — R42 Dizziness and giddiness: Secondary | ICD-10-CM

## 2020-05-06 MED ORDER — AMLODIPINE BESYLATE 5 MG PO TABS: 5.0000 mg | ORAL_TABLET | Freq: Every day | ORAL | 11 refills | Status: DC

## 2020-05-06 NOTE — Telephone Encounter (Signed)
Enrolled patient for a 14 day Zio XT  monitor to be mailed to patients home  °

## 2020-05-06 NOTE — Progress Notes (Signed)
Cardiology Office Note:    Date:  05/06/2020   ID:  Tracey Riddle, DOB 1951/02/24, MRN [id number]  PCP:  Gladys Damme, MD  [facility] Cardiologist:  No primary care provider on file.  [facility] Electrophysiologist:  None   CC: dizziness Consulted for the evaluation of dizziness at the behest of Gladys Damme, MD  History of Present Illness:    Tracey Riddle is a 69 y.o. female with a hx of T2DM with HTN, Hep C who presents for dizziness evaluation.  Patient notes two weeks prior, she had dizzy spells.  She felt that her heart was beating very fast.  Did not pass out.  This and the palpitations are new.  Denies chest pain or chest pressure.  Notes no shortness of breath or DOE.  No PND or orthopnea.  No weight gain, no abdominal swelling or leg swelling. No syncope.  Palpitations occur at least once a week. Normal BNP prior.  Past Medical History:  Diagnosis Date  . 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  . Diabetes mellitus type II, controlled ([place]) 08/27/2019  . Hepatitis C     Past Surgical History:  Procedure Laterality Date  . BIOPSY  09/07/2019   Procedure: BIOPSY;  Surgeon: Wilford Corner, MD;  Location: [place];  Service: Endoscopy;;  . ESOPHAGEAL BRUSHING  09/07/2019   Procedure: ESOPHAGEAL BRUSHING;  Surgeon: Wilford Corner, MD;  Location: [place];  Service: Endoscopy;;  . ESOPHAGOGASTRODUODENOSCOPY (EGD) WITH PROPOFOL Left 09/07/2019   Procedure: ESOPHAGOGASTRODUODENOSCOPY (EGD) WITH PROPOFOL;  Surgeon: Wilford Corner, MD;  Location: [place];  Service: Endoscopy;  Laterality: Left;    Current Medications: Current Meds  Medication Sig  . acetaminophen (TYLENOL) 500 MG tablet Take 500 mg by mouth every 8 (eight) hours as needed for mild pain or moderate pain.   Marland Kitchen amLODipine (NORVASC) 2.5 MG tablet Take 1 tablet (2.5 mg total) by mouth at bedtime.   Marland Kitchen atorvastatin (LIPITOR) 20 MG tablet TAKE 1 TABLET (20 MG TOTAL) BY MOUTH DAILY AT 6 PM.  . blood glucose meter kit and supplies KIT Dispense based on patient and insurance preference. Use up to four times daily as directed. (FOR ICD-9 250.00, 250.01).  . Blood Glucose Monitoring Suppl (ACCU-CHEK GUIDE) w/Device KIT 1 each by Does not apply route 4 (four) times daily -  before meals and at bedtime.  . Blood Glucose Monitoring Suppl (ONETOUCH VERIO) w/Device KIT Please use to check blood sugar three times daily. E11.9  . diclofenac Sodium (VOLTAREN) 1 % GEL Apply 4 g topically 4 (four) times daily.  . fesoterodine (TOVIAZ) 8 MG TB24 tablet Take 1 tablet (8 mg total) by mouth daily.  . fexofenadine (ALLEGRA ALLERGY) 60 MG tablet Take 1 tablet (60 mg total) by mouth daily. (Patient taking differently: Take 60 mg by mouth as needed. )  . fluticasone (FLONASE) 50 MCG/ACT nasal spray Place 2 sprays into both nostrils daily.  Marland Kitchen glucose blood (ONETOUCH VERIO) test strip Please use to check blood sugar three times daily. E11.9  . Lancet Devices (ONE TOUCH DELICA LANCING DEV) MISC Please use to check blood sugar three times daily. E11.9  . metFORMIN (GLUCOPHAGE-XR) 750 MG 24 hr tablet TAKE 1 TABLET BY MOUTH EVERY DAY WITH BREAKFAST  . ondansetron (ZOFRAN-ODT) 4 MG disintegrating tablet Take 4 mg by mouth 3 (three) times daily as needed.  [name] 56D MISC Please use to check  blood sugar three times daily. E11.9  . pantoprazole (PROTONIX) 40 MG tablet Take 1 tablet (40 mg total) by mouth daily as needed. (Patient taking differently: Take 40 mg by mouth daily as needed. Med on hold)  . sennosides-docusate sodium (SENOKOT-S) 8.6-50 MG tablet Take 1 tablet by mouth daily.  . Sofosbuvir-Velpatasvir (EPCLUSA) 400-100 MG TABS Take 1 tablet by mouth daily.  . traMADol (ULTRAM) 50 MG tablet Take 1 tablet (50 mg total) by mouth every 8 (eight) hours as needed.     Allergies:   Patient has no known  allergies.   Social History   Socioeconomic History  . Marital status: Single    Spouse name: Not on file  . Number of children: Not on file  . Years of education: Not on file  . Highest education level: Not on file  Occupational History  . Not on file  Tobacco Use  . Smoking status: Never Smoker  . Smokeless tobacco: Never Used  Substance and Sexual Activity  . Alcohol use: Not on file  . Drug use: Never  . Sexual activity: Never  Other Topics Concern  . Not on file  Social History Narrative  . Not on file   Social Determinants of Health   Financial Resource Strain:   . Difficulty of Paying Living Expenses: Not on file  Food Insecurity:   . Worried About Charity fundraiser in the Last Year: Not on file  . Ran Out of Food in the Last Year: Not on file  Transportation Needs:   . Lack of Transportation (Medical): Not on file  . Lack of Transportation (Non-Medical): Not on file  Physical Activity:   . Days of Exercise per Week: Not on file  . Minutes of Exercise per Session: Not on file  Stress:   . Feeling of Stress : Not on file  Social Connections:   . Frequency of Communication with Friends and Family: Not on file  . Frequency of Social Gatherings with Friends and Family: Not on file  . Attends Religious Services: Not on file  . Active Member of Clubs or Organizations: Not on file  . Attends Archivist Meetings: Not on file  . Marital Status: Not on file    Family History: The patient and niece deny family history of medical problems. No heart disease in family. ROS:   Please see the history of present illness.    All other systems reviewed and are negative.  EKGs/Labs/Other Studies Reviewed:    The following studies were reviewed today:  EKG:  EKG is ordered today.  The ekg ordered today demonstrates SR 91 no ST/T changes 09/10/19:  SR rate 90 no ST/T changes  Recent Labs: 08/27/2019: B Natriuretic Peptide 49.7 09/04/2019: TSH 0.694 09/08/2019:  Magnesium 1.8 01/19/2020: Hemoglobin 12.4; Platelets 203 04/04/2020: ALT 41; BUN 13; Creat 0.74; Potassium 4.5; Sodium 139  Recent Lipid Panel    Component Value Date/Time   CHOL 95 08/26/2019 1530   TRIG 95 08/26/2019 1530   HDL 32 (L) 08/26/2019 1530   CHOLHDL 3.0 08/26/2019 1530   VLDL 19 08/26/2019 1530   LDLCALC 44 08/26/2019 1530    Risk Assessment/Calculations:   None  Physical Exam:    VS:  BP (!) 160/80   Pulse 91   Ht _0  (1.6 m)   Wt 182 lb (82.6 kg)   SpO2 98%   BMI 32.24 kg/m     Wt Readings from Last 3 Encounters:  05/06/20 182  lb (82.6 kg)  05/04/20 183 lb 9.6 oz (83.3 kg)  04/21/20 182 lb 6.4 oz (82.7 kg)    GEN: Well nourished, well developed in no acute distress HEENT: Normal NECK: No JVD; No carotid bruits LYMPHATICS: No lymphadenopathy CARDIAC: RRR, no murmurs, rubs, gallops RESPIRATORY:  Clear to auscultation without rales, wheezing or rhonchi  ABDOMEN: Soft, non-tender, non-distended MUSCULOSKELETAL:  No edema; No deformity  SKIN: Warm and dry NEUROLOGIC:  Alert and oriented x 3 PSYCHIATRIC:  Normal affect   ASSESSMENT:    1. Palpitations   2. Dizziness   3. Diabetes mellitus with coincident hypertension (HCC)    PLAN:    In order of problems listed above:  Palpitations with dizziness -  14 day non-live ZioPatch  Diabetes with hypertension - ambulatory blood pressure not done, will start ambulatory BP monitoring; gave education on how to perform ambulatory blood pressure monitoring including the frequency and technique; goal ambulatory blood pressure < 135/85 on average - continue home medications with the exception of an increase to her amlodipine to 5 mg daily - discussed diet (DASH/low sodium), and exercise/weight loss interventions  3 month follow up unless new symptoms or abnormal test results warranting change in plan  Would be reasonable for  APP Follow up   Shared Decision Making/Informed Consent        Medication  Adjustments/Labs and Tests Ordered: Current medicines are reviewed at length with the patient today.  Concerns regarding medicines are outlined above.  No orders of the defined types were placed in this encounter.  No orders of the defined types were placed in this encounter.   There are no Patient Instructions on file for this visit.   Signed, Werner Lean, MD  05/06/2020 4:56 PM    [facility]

## 2020-05-06 NOTE — Patient Instructions (Signed)
Medication Instructions:  1. Increase the amlodipine (Norvasc) to 5 mg, take 1 tablet by mouth daily. *If you need a refill on your cardiac medications before your next appointment, please call your pharmacy*   Lab Work: None If you have labs (blood work) drawn today and your tests are completely normal, you will receive your results only by: Marland Kitchen MyChart Message (if you have MyChart) OR . A paper copy in the mail If you have any lab test that is abnormal or we need to change your treatment, we will call you to review the results.   Testing/Procedures: Christena Deem- Long Term Monitor Instructions   Your physician has requested you wear your ZIO patch monitor____14___days.   This is a single patch monitor.  [employer] supplies one patch monitor per enrollment.  Additional stickers are not available.   Please do not apply patch if you will be having a Nuclear Stress Test, Echocardiogram, Cardiac CT, MRI, or Chest Xray during the time frame you would be wearing the monitor. The patch cannot be worn during these tests.  You cannot remove and re-apply the ZIO XT patch monitor.   Your ZIO patch monitor will be sent USPS Priority mail from [facility] directly to your home address. The monitor may also be mailed to a PO BOX if home delivery is not available.   It may take 3-5 days to receive your monitor after you have been enrolled.   Once you have received you monitor, please review enclosed instructions.  Your monitor has already been registered assigning a specific monitor serial # to you.   Applying the monitor   Shave hair from upper left chest.   Hold abrader disc by orange tab.  Rub abrader in 40 strokes over left upper chest as indicated in your monitor instructions.   Clean area with 4 enclosed alcohol pads .  Use all pads to assure are is cleaned thoroughly.  Let dry.   Apply patch as indicated in monitor instructions.  Patch will be place under collarbone on left side of chest  with arrow pointing upward.   Rub patch adhesive wings for 2 minutes.Remove white label marked "1".  Remove white label marked "2".  Rub patch adhesive wings for 2 additional minutes.   While looking in a mirror, press and release button in center of patch.  A small green light will flash 3-4 times .  This will be your only indicator the monitor has been turned on.     Do not shower for the first 24 hours.  You may shower after the first 24 hours.   Press button if you feel a symptom. You will hear a small click.  Record Date, Time and Symptom in the Patient Log Book.   When you are ready to remove patch, follow instructions on last 2 pages of Patient Log Book.  Stick patch monitor onto last page of Patient Log Book.   Place Patient Log Book in [place] box.  Use locking tab on box and tape box closed securely.  The [employer] and [employer] has [employer] on it.  Please place in mailbox as soon as possible.  Your physician should have your test results approximately 7 days after the monitor has been mailed back to [facility].   Call [facility] Customer Care at [phone] if you have questions regarding your ZIO XT patch monitor.  Call them immediately if you see an orange light blinking on your monitor.   If your monitor  falls off in less than 4 days contact our Monitor department at [phone].  If your monitor becomes loose or falls off after 4 days call [employer] at [phone] for suggestions on securing your monitor.     Follow-Up: At [facility], you and your health needs are our priority.  As part of our continuing mission to provide you with exceptional heart care, we have created designated Provider Care Teams.  These Care Teams include your primary Cardiologist (physician) and Advanced Practice Providers (APPs -  Physician Assistants and Nurse Practitioners) who all work together to provide you with the care you need, when you need it.  We recommend signing up for the  patient portal called "MyChart".  Sign up information is provided on this After Visit Summary.  MyChart is used to connect with patients for Virtual Visits (Telemedicine).  Patients are able to view lab/test results, encounter notes, upcoming appointments, etc.  Non-urgent messages can be sent to your provider as well.   To learn more about what you can do with MyChart, go to [email or web address].    Your next appointment:   July 11, 2020  The format for your next appointment:   In Person  Provider:   Riley Lam, MD

## 2020-05-08 LAB — URINE CULTURE

## 2020-05-11 ENCOUNTER — Ambulatory Visit
Admission: RE | Admit: 2020-05-11 | Discharge: 2020-05-11 | Disposition: A | Discharge status: Home / Self Care | Payer: Medicare Other | Source: Ambulatory Visit | Attending: Family Medicine | Admitting: Family Medicine

## 2020-05-11 ENCOUNTER — Other Ambulatory Visit: Payer: Self-pay

## 2020-05-11 DIAGNOSIS — E2839 Other primary ovarian failure: Secondary | ICD-10-CM

## 2020-05-11 DIAGNOSIS — Z1231 Encounter for screening mammogram for malignant neoplasm of breast: Secondary | ICD-10-CM

## 2020-05-13 ENCOUNTER — Ambulatory Visit (INDEPENDENT_AMBULATORY_CARE_PROVIDER_SITE_OTHER): Payer: Medicare Other

## 2020-05-13 DIAGNOSIS — I10 Essential (primary) hypertension: Secondary | ICD-10-CM

## 2020-05-13 DIAGNOSIS — R42 Dizziness and giddiness: Secondary | ICD-10-CM

## 2020-05-13 DIAGNOSIS — R002 Palpitations: Secondary | ICD-10-CM | POA: Diagnosis not present

## 2020-05-13 DIAGNOSIS — E119 Type 2 diabetes mellitus without complications: Secondary | ICD-10-CM

## 2020-05-19 ENCOUNTER — Encounter: Payer: Self-pay | Admitting: Family Medicine

## 2020-05-19 ENCOUNTER — Telehealth: Payer: Self-pay | Admitting: Family Medicine

## 2020-05-19 ENCOUNTER — Other Ambulatory Visit: Payer: Self-pay | Admitting: Infectious Diseases

## 2020-05-19 DIAGNOSIS — M810 Age-related osteoporosis without current pathological fracture: Secondary | ICD-10-CM | POA: Insufficient documentation

## 2020-05-19 DIAGNOSIS — E1169 Type 2 diabetes mellitus with other specified complication: Secondary | ICD-10-CM

## 2020-05-19 DIAGNOSIS — B9629 Other Escherichia coli [E. coli] as the cause of diseases classified elsewhere: Secondary | ICD-10-CM | POA: Insufficient documentation

## 2020-05-19 DIAGNOSIS — N390 Urinary tract infection, site not specified: Secondary | ICD-10-CM | POA: Insufficient documentation

## 2020-05-19 DIAGNOSIS — N3000 Acute cystitis without hematuria: Secondary | ICD-10-CM

## 2020-05-19 MED ORDER — NITROFURANTOIN MONOHYD MACRO 100 MG PO CAPS: 100.0000 mg | ORAL_CAPSULE | Freq: Two times a day (BID) | ORAL | 0 refills | Status: AC

## 2020-05-19 MED ORDER — DENOSUMAB 60 MG/ML ~~LOC~~ SOSY: 60.0000 mg | PREFILLED_SYRINGE | Freq: Once | SUBCUTANEOUS | 0 refills | Status: AC

## 2020-05-19 NOTE — Telephone Encounter (Signed)
Discussed with patient's niece Musician- per patient's request) UTI with ESBL e. Coli sensistive to macrobid. Will treat with macrobid 100 mg BID x7 days per IDSA guidelines. Also discussed osteoporosis, plan to treat with denosumab. Discussed SEs and risks, will order and have patient pick up at pharmacy and bring to clinic for administration. Will get CA same day. Patient to return in 1-2 weeks for TOC of UTI, give prolia, and get calcium lab.  Shirlean Mylar, MD [facility] Res[facility], PGY-2

## 2020-05-24 ENCOUNTER — Other Ambulatory Visit: Payer: Self-pay

## 2020-05-24 MED ORDER — DENOSUMAB 60 MG/ML ~~LOC~~ SOSY: 60.0000 mg | PREFILLED_SYRINGE | Freq: Once | SUBCUTANEOUS | 0 refills | Status: AC

## 2020-05-26 NOTE — Patient Instructions (Addendum)
It was a pleasure to see you today!  1. We did lab work today to check in on her UTI, I expect results back next week and I will let you know by phone or MyChart.  2. Message me next weekend by MyChart with her blood pressures. Take them at the same time every day, and have her sit for 5 minutes before you take the blood pressure with legs uncrossed. This is the best measurement of blood pressure.  3. Take calcium and vit d supplements. You can take them once daily.   4. Ask the infectious disease doctors if it is ok to take prolia (denosumab) with her Hep C treatment. I do not think there is a problem, but better double check.  5. When we get in the prolia (injection for low bone), we will schedule you for a nursing visit to administer the medication. This will be every 6 months.  6. Decrease metformin to 500 mg daily. If she does well at this level, perhaps we can stop and recheck a1c after that.  7. Follow up in 3 months  Be Well,  Dr. Leary Roca

## 2020-05-26 NOTE — Progress Notes (Signed)
SUBJECTIVE:   CHIEF COMPLAINT / HPI: f/u  SBL UTI: Patient does not have any symptoms she reports improvement in incontinence with [name] that she has at baseline, will check UA and urine culture today for test of cure.  Osteoporosis and osteopenia: Recommend patient take Os-Cal vitamin D.  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  Patient and niece agree.  Will have Prolia shipped to his office, pharmacy reports that I will be here in January 14.  Recommend niece discussed with [facility] who they will see on Monday.  I cannot find any interactions with using Prolia with hep C medication, but will check with [facility] just in case.  As always been normal, she will need calcium checked the day that she gets Prolia, and then a week later.  Diabetes: Patient has continued to do quite well with blood sugar management, last A1c was very low at 5.4%.  Will decrease Metformin to 500 mg daily, hopefully will be able to stop Metformin altogether if possible at next A1c check.  Will obtain urine microalbumin today.  Blood pressure: Patient has elevated blood pressure today at 150/90.  Patient has had dizziness, and hypotension in the past.  Instead of changing medications today I would like to have more reliable blood pressures.  Discussed with niece and with patient to obtain blood pressure for the next week by measuring blood pressure at the same time every day.  Patient's niece will call me/send my chart message with blood pressure results.  These will be the most accurate measures of blood pressure, and the best way to make decisions moving forward.  Healthcare maintenance: Patient had two shots a month or not, would like to get the 3rd Covid shot.  She will get Pfizer today.  PERTINENT  PMH / PSH: Hypertension, diabetes, ESBL UTI, osteoporosis  OBJECTIVE:   BP (!) 150/90   Pulse  90   Ht 5\' 3"  (1.6 m)   Wt 185 lb (83.9 kg)   SpO2 98%   BMI 32.77 kg/m   Nursing note and vitals reviewed GEN: Age-appropriate African woman resting comfortably in chair, WNWD, NAD HEENT: PERRLA.  Sclera without injection or icterus. MMM.  Neck: Supple.  Cardiac: Regular rate and rhythm. Normal S1/S2. No murmurs, rubs, or gallops appreciated. Lungs: Clear bilaterally to ascultation.  Neuro: AO x3, at baseline Ext: No peripheral edema Psych: Pleasant and appropriate   ASSESSMENT/PLAN:   Type 2 diabetes mellitus (HCC) Decrease Metformin to 500 mg daily, will recheck A1c in February.  At that time we will likely consider discontinuing Metformin altogether and recheck 3 months after that.  Suspect that diabetes was actually systemic corticosteroid induced insulin resistance.  Hypertension Patient and niece to obtain blood pressure at home for the next week and send the results.  Depending on these numbers may make changes.  Patient had BP 150/90 today presently on amlodipine 5 mg, no lower extremity edema.  Osteoporosis Obtain calcium level when treated with Prolia, plan to treat with Prolia in either January or February based on hep C current treatment.  She will need a subsequent calcium level 1 week after Prolia administration.  So far her calcium has always been normal.  Urinary tract infection due to extended-spectrum beta lactamase (ESBL) producing Escherichia coli Patient completed Macrobid treatment.  Will obtain urine culture today for test of cure.  Healthcare maintenance To get 3rd Covid vaccine  today     Shirlean Mylar, MD [facility]

## 2020-05-27 ENCOUNTER — Other Ambulatory Visit: Payer: Self-pay

## 2020-05-27 ENCOUNTER — Ambulatory Visit (INDEPENDENT_AMBULATORY_CARE_PROVIDER_SITE_OTHER): Payer: Medicare Other | Admitting: Family Medicine

## 2020-05-27 ENCOUNTER — Encounter: Payer: Self-pay | Admitting: Family Medicine

## 2020-05-27 VITALS — BP 150/90 | HR 90 | Ht 63.0 in | Wt 185.0 lb

## 2020-05-27 DIAGNOSIS — I10 Essential (primary) hypertension: Secondary | ICD-10-CM

## 2020-05-27 DIAGNOSIS — Z1612 Extended spectrum beta lactamase (ESBL) resistance: Secondary | ICD-10-CM | POA: Diagnosis not present

## 2020-05-27 DIAGNOSIS — E119 Type 2 diabetes mellitus without complications: Secondary | ICD-10-CM | POA: Diagnosis not present

## 2020-05-27 DIAGNOSIS — Z0000 Encounter for general adult medical examination without abnormal findings: Secondary | ICD-10-CM

## 2020-05-27 DIAGNOSIS — Z23 Encounter for immunization: Secondary | ICD-10-CM | POA: Diagnosis not present

## 2020-05-27 DIAGNOSIS — M818 Other osteoporosis without current pathological fracture: Secondary | ICD-10-CM | POA: Diagnosis not present

## 2020-05-27 DIAGNOSIS — N390 Urinary tract infection, site not specified: Secondary | ICD-10-CM | POA: Diagnosis present

## 2020-05-27 DIAGNOSIS — B9629 Other Escherichia coli [E. coli] as the cause of diseases classified elsewhere: Secondary | ICD-10-CM | POA: Diagnosis not present

## 2020-05-27 MED ORDER — METFORMIN HCL ER 500 MG PO TB24: 500.0000 mg | ORAL_TABLET | Freq: Every day | ORAL | 3 refills | Status: DC

## 2020-05-27 MED ORDER — CALCIUM CARBONATE-VITAMIN D 500-200 MG-UNIT PO TABS: 1.0000 | ORAL_TABLET | Freq: Every day | ORAL | 11 refills | Status: DC

## 2020-05-28 LAB — MICROALBUMIN / CREATININE URINE RATIO
Creatinine, Urine: 12.2 mg/dL
Microalb/Creat Ratio: 219 mg/g creat — ABNORMAL HIGH (ref 0–29)
Microalbumin, Urine: 26.7 ug/mL

## 2020-05-28 NOTE — Assessment & Plan Note (Signed)
Patient and niece to obtain blood pressure at home for the next week and send the results.  Depending on these numbers may make changes.  Patient had BP 150/90 today presently on amlodipine 5 mg, no lower extremity edema.

## 2020-05-28 NOTE — Assessment & Plan Note (Signed)
To get 3rd Covid vaccine today

## 2020-05-28 NOTE — Assessment & Plan Note (Signed)
Patient completed Macrobid treatment.  Will obtain urine culture today for test of cure.

## 2020-05-28 NOTE — Assessment & Plan Note (Signed)
Decrease Metformin to 500 mg daily, will recheck A1c in February.  At that time we will likely consider discontinuing Metformin altogether and recheck 3 months after that.  Suspect that diabetes was actually systemic corticosteroid induced insulin resistance.

## 2020-05-28 NOTE — Progress Notes (Signed)
HPI: Tracey Riddle is a 69 y.o. female who presents to the [facility] for Hepatitis C follow-up.  Medication: [name]  Start Date: 03/10/20  Hepatitis C Genotype: 4  Fibrosis Score: F4  Hepatitis C RNA: 18 IU/mL as of 04/04/20  Patient Active Problem List   Diagnosis Date Noted  . Osteoporosis 05/19/2020  . Urinary tract infection due to extended-spectrum beta lactamase (ESBL) producing Escherichia coli 05/19/2020  . Diabetes mellitus with coincident hypertension ([place]) 05/06/2020  . Osteoarthritis of left knee 05/04/2020  . Dizziness 04/18/2020  . Palpitations 04/18/2020  . Drug interaction 03/13/2020  . Overactive bladder 12/31/2019  . Healthcare maintenance 12/31/2019  . Dental caries 12/30/2019  . Hypertension 11/21/2019  . Tachycardia 11/21/2019  . Chronic viral hepatitis C ([place]) 09/08/2019  . Abnormal liver CT 08/27/2019  . Type 2 diabetes mellitus ([place]) 08/27/2019  . Hypoproteinemia ([place]) 08/27/2019    Patient's Medications  New Prescriptions   No medications on file  Previous Medications   ACETAMINOPHEN (TYLENOL) 500 MG TABLET    Take 500 mg by mouth every 8 (eight) hours as needed for mild pain or moderate pain.    AMLODIPINE (NORVASC) 5 MG TABLET    Take 1 tablet (5 mg total) by mouth daily.   ATORVASTATIN (LIPITOR) 20 MG TABLET    TAKE 1 TABLET (20 MG TOTAL) BY MOUTH DAILY AT 6 PM.   BLOOD GLUCOSE METER KIT AND SUPPLIES KIT    Dispense based on patient and insurance preference. Use up to four times daily as directed. (FOR ICD-9 250.00, 250.01).   BLOOD GLUCOSE MONITORING SUPPL (ACCU-CHEK GUIDE) W/DEVICE KIT    1 each by Does not apply route 4 (four) times daily -  before meals and at bedtime.   BLOOD GLUCOSE MONITORING SUPPL (ONETOUCH VERIO) W/DEVICE KIT    Please use to check blood sugar three times daily. E11.9   CALCIUM-VITAMIN D (OSCAL WITH D) 500-200 MG-UNIT TABLET    Take 1 tablet by mouth daily with breakfast.   DICLOFENAC SODIUM  (VOLTAREN) 1 % GEL    Apply 4 g topically 4 (four) times daily.   FESOTERODINE (TOVIAZ) 8 MG TB24 TABLET    Take 1 tablet (8 mg total) by mouth daily.   FEXOFENADINE (ALLEGRA ALLERGY) 60 MG TABLET    Take 1 tablet (60 mg total) by mouth daily.   FLUTICASONE (FLONASE) 50 MCG/ACT NASAL SPRAY    Place 2 sprays into both nostrils daily.   GLUCOSE BLOOD (ONETOUCH VERIO) TEST STRIP    Please use to check blood sugar three times daily. E11.9   LANCET DEVICES (ONE TOUCH DELICA LANCING DEV) MISC    Please use to check blood sugar three times daily. E11.9   METFORMIN (GLUCOPHAGE-XR) 500 MG 24 HR TABLET    Take 1 tablet (500 mg total) by mouth daily with breakfast.   ONDANSETRON (ZOFRAN-ODT) 4 MG DISINTEGRATING TABLET    Take 4 mg by mouth 3 (three) times daily as needed.   ONETOUCH DELICA LANCETS 21J MISC    Please use to check blood sugar three times daily. E11.9   PANTOPRAZOLE (PROTONIX) 40 MG TABLET    Take 1 tablet (40 mg total) by mouth daily as needed.   SENNOSIDES-DOCUSATE SODIUM (SENOKOT-S) 8.6-50 MG TABLET    Take 1 tablet by mouth daily.   SOFOSBUVIR-VELPATASVIR (EPCLUSA) 400-100 MG TABS    Take 1 tablet by mouth daily.   TRAMADOL (ULTRAM) 50 MG TABLET    Take 1 tablet (50  mg total) by mouth every 8 (eight) hours as needed.  Modified Medications   No medications on file  Discontinued Medications   No medications on file    Allergies: No Known Allergies  Past Medical History: Past Medical History:  Diagnosis Date  . 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  . Diabetes mellitus type II, controlled ([place]) 08/27/2019  . Hepatitis C     Social History: Social History   Socioeconomic History  . Marital status: Single    Spouse name: Not on file  . Number of children: Not on file  . Years of education: Not on file  . Highest education level: Not on file  Occupational History  . Not on file  Tobacco Use   . Smoking status: Never Smoker  . Smokeless tobacco: Never Used  Substance and Sexual Activity  . Alcohol use: Not on file  . Drug use: Never  . Sexual activity: Never  Other Topics Concern  . Not on file  Social History Narrative  . Not on file   Social Determinants of Health   Financial Resource Strain: Not on file  Food Insecurity: Not on file  Transportation Needs: Not on file  Physical Activity: Not on file  Stress: Not on file  Social Connections: Not on file    Labs: Hepatitis C Lab Results  Component Value Date   HCVGENOTYPE 4 09/04/2019   HCVRNAPCRQN 18 (H) 04/04/2020   HCVRNAPCRQN 3,650,000 (H) 01/19/2020   FIBROSTAGE F2 01/19/2020   Hepatitis B Lab Results  Component Value Date   HEPBSAG NON REACTIVE 08/27/2019   Hepatitis A No results found for: HAV HIV No results found for: HIV Lab Results  Component Value Date   CREATININE 0.74 04/04/2020   CREATININE 0.75 01/19/2020   CREATININE 0.79 11/20/2019   CREATININE 0.4 (A) 09/23/2019   CREATININE 0.60 09/14/2019   Lab Results  Component Value Date   AST 40 (H) 04/04/2020   AST 52 (H) 01/19/2020   AST 31 09/06/2019   ALT 41 (H) 04/04/2020   ALT 41 (H) 01/19/2020   ALT 43 (H) 01/19/2020   INR 1.1 01/19/2020   INR 1.2 08/28/2019    Assessment: Tracey Riddle declined an in-person interpretor and prefers her niece to interpret.  Tracey Riddle presents today in good spirits accompanied by her niece for Epclusa EOT visit for HCV management. She reports 4 more days left of Epclusa (completion date of 06/03/20). She reports complete medication adherence and no side effects with Epclusa. She reports plans to start Prolia (denosumab) and vitamin D for osteoporosis management - no major drug interactions identified with Epclusa. Discussed antibody test will always be postive and to inform providers she has been treated. Discussed the importance of limitingrisky behavior as she can still be reinfected with HepC even  though she has completed treatment. Patient verbalized understanding. Will check HCV viral load today.  Plan: - Continue Epclusa 1 tablet daily w/food -Labs:Hep C viral load  - F/u SVR cure visit in 12 weeks with Colletta Maryland on 08/22/2020  Lorel Monaco, PharmD, BCPS PGY2 Ambulatory Care Resident [place]

## 2020-05-28 NOTE — Assessment & Plan Note (Signed)
Obtain calcium level when treated with Prolia, plan to treat with Prolia in either January or February based on hep C current treatment.  She will need a subsequent calcium level 1 week after Prolia administration.  So far her calcium has always been normal.

## 2020-05-30 ENCOUNTER — Ambulatory Visit (INDEPENDENT_AMBULATORY_CARE_PROVIDER_SITE_OTHER): Payer: Medicare Other | Admitting: Pharmacist

## 2020-05-30 ENCOUNTER — Other Ambulatory Visit: Payer: Medicare Other

## 2020-05-30 ENCOUNTER — Other Ambulatory Visit: Payer: Self-pay

## 2020-05-30 DIAGNOSIS — B182 Chronic viral hepatitis C: Secondary | ICD-10-CM

## 2020-05-30 LAB — URINE CULTURE

## 2020-06-01 LAB — HEPATITIS C RNA QUANTITATIVE
HCV Quantitative Log: <1.18 log IU/mL
HCV RNA, PCR, QN: <15 IU/mL

## 2020-06-02 ENCOUNTER — Telehealth: Payer: Self-pay | Admitting: Family Medicine

## 2020-06-02 NOTE — Telephone Encounter (Signed)
ESBL UTI TOC clear. Informed patient's niece who is her Nurse, learning disability.   Shirlean Mylar, MD [facility], PGY-2

## 2020-06-11 ENCOUNTER — Other Ambulatory Visit: Payer: Self-pay | Admitting: Infectious Diseases

## 2020-06-11 DIAGNOSIS — E785 Hyperlipidemia, unspecified: Secondary | ICD-10-CM

## 2020-06-13 ENCOUNTER — Other Ambulatory Visit: Payer: Self-pay

## 2020-06-13 DIAGNOSIS — J302 Other seasonal allergic rhinitis: Secondary | ICD-10-CM

## 2020-06-13 MED ORDER — FLUTICASONE PROPIONATE 50 MCG/ACT NA SUSP: 2.0000 | Freq: Every day | NASAL | 6 refills | Status: DC

## 2020-06-14 ENCOUNTER — Encounter: Payer: Self-pay | Admitting: Family Medicine

## 2020-07-01 ENCOUNTER — Ambulatory Visit: Payer: Medicare Other

## 2020-07-01 ENCOUNTER — Other Ambulatory Visit: Payer: Self-pay

## 2020-07-01 DIAGNOSIS — M810 Age-related osteoporosis without current pathological fracture: Secondary | ICD-10-CM

## 2020-07-01 NOTE — Progress Notes (Signed)
Patient presents in nurse clinic for initial Prolia injection. Patient had supply sent to [place] via speciality pharmacy.   Injection given left arm subcutaneously. Patient tolerated well.  Lot: [id number] Exp: 10/16/2022 NDC: 94709-628-36  Pateint was escorted to the pharmacy to have calcium lab drawn. Verbal given by PCP.

## 2020-07-02 LAB — CALCIUM: Calcium: 10 mg/dL (ref 8.7–10.3)

## 2020-07-04 NOTE — Progress Notes (Signed)
Monitoring for treatment with Prolia.Calcium level appropriate.

## 2020-07-08 ENCOUNTER — Other Ambulatory Visit: Payer: Self-pay

## 2020-07-08 DIAGNOSIS — J302 Other seasonal allergic rhinitis: Secondary | ICD-10-CM

## 2020-07-08 MED ORDER — FEXOFENADINE HCL 60 MG PO TABS: 60.0000 mg | ORAL_TABLET | ORAL | 6 refills | Status: DC | PRN

## 2020-07-21 ENCOUNTER — Other Ambulatory Visit: Payer: Self-pay | Admitting: Infectious Diseases

## 2020-07-21 DIAGNOSIS — E1169 Type 2 diabetes mellitus with other specified complication: Secondary | ICD-10-CM

## 2020-07-21 DIAGNOSIS — E785 Hyperlipidemia, unspecified: Secondary | ICD-10-CM

## 2020-07-28 ENCOUNTER — Other Ambulatory Visit: Payer: Self-pay | Admitting: Family Medicine

## 2020-07-28 DIAGNOSIS — E119 Type 2 diabetes mellitus without complications: Secondary | ICD-10-CM

## 2020-08-11 ENCOUNTER — Other Ambulatory Visit: Payer: Self-pay

## 2020-08-11 ENCOUNTER — Encounter: Payer: Self-pay | Admitting: Internal Medicine

## 2020-08-11 ENCOUNTER — Ambulatory Visit (INDEPENDENT_AMBULATORY_CARE_PROVIDER_SITE_OTHER): Payer: Medicare Other | Admitting: Internal Medicine

## 2020-08-11 VITALS — BP 162/88 | HR 81 | Ht 63.0 in | Wt 186.6 lb

## 2020-08-11 DIAGNOSIS — I491 Atrial premature depolarization: Secondary | ICD-10-CM | POA: Diagnosis not present

## 2020-08-11 DIAGNOSIS — I10 Essential (primary) hypertension: Secondary | ICD-10-CM | POA: Diagnosis not present

## 2020-08-11 DIAGNOSIS — E119 Type 2 diabetes mellitus without complications: Secondary | ICD-10-CM | POA: Diagnosis not present

## 2020-08-11 MED ORDER — CARVEDILOL 3.125 MG PO TABS: 3.1250 mg | ORAL_TABLET | Freq: Two times a day (BID) | ORAL | 3 refills | Status: DC

## 2020-08-11 MED ORDER — AMLODIPINE BESYLATE 2.5 MG PO TABS: 2.5000 mg | ORAL_TABLET | Freq: Every day | ORAL | 3 refills | Status: DC

## 2020-08-11 NOTE — Patient Instructions (Signed)
Medication Instructions:  Your physician has recommended you make the following change in your medication:   START: carvedilol (Coreg) 3.125mg  by mouth twice daily  *If you need a refill on your cardiac medications before your next appointment, please call your pharmacy*   Lab Work: NONE If you have labs (blood work) drawn today and your tests are completely normal, you will receive your results only by: Marland Kitchen MyChart Message (if you have MyChart) OR . A paper copy in the mail If you have any lab test that is abnormal or we need to change your treatment, we will call you to review the results.   Testing/Procedures: NONE   Follow-Up: At [facility], you and your health needs are our priority.  As part of our continuing mission to provide you with exceptional heart care, we have created designated Provider Care Teams.  These Care Teams include your primary Cardiologist (physician) and Advanced Practice Providers (APPs -  Physician Assistants and Nurse Practitioners) who all work together to provide you with the care you need, when you need it.  We recommend signing up for the patient portal called "MyChart".  Sign up information is provided on this After Visit Summary.  MyChart is used to connect with patients for Virtual Visits (Telemedicine).  Patients are able to view lab/test results, encounter notes, upcoming appointments, etc.  Non-urgent messages can be sent to your provider as well.   To learn more about what you can do with MyChart, go to [email or web address].    Your next appointment:   6 month(s)  The format for your next appointment:   In Person  Provider:   You may see Izora Ribas, MD or one of the following Advanced Practice Providers on your designated Care Team:    Ronie Spies, PA-C  Jacolyn Reedy, PA-C

## 2020-08-11 NOTE — Progress Notes (Signed)
Cardiology Office Note:    Date:  08/11/2020   ID:  Tracey Riddle, DOB [birth year], MRN [id number]  PCP:  Gladys Damme, MD  [facility] Cardiologist: Rudean Haskell MD  [place] Electrophysiologist:  None   CC: dizziness follow up  History of Present Illness:    Tracey Riddle is a 70 y.o. female with a hx of T2DM with HTN, Hep C who presents for dizziness evaluation 05/06/20.  In interim of this visit, patient had Ziopatch with symptom triggered PACs.  Patient notes that she is doing well.  Since last visit notes no funny heart beats.  Relevant interval testing or therapy include decreasing norvasc because of headache.  There are no interval hospital/ED visit.    No chest pain or pressure.  No SOB/DOE and no PND/Orthopnea.  No weight gain or leg swelling.  No palpitations or syncope.  Ambulatory blood pressure log reviewed:  138/86 pulse 80 on average.  Past Medical History:  Diagnosis Date  . 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  . Diabetes mellitus type II, controlled ([place]) 08/27/2019  . Hepatitis C     Past Surgical History:  Procedure Laterality Date  . BIOPSY  09/07/2019   Procedure: BIOPSY;  Surgeon: Wilford Corner, MD;  Location: [facility];  Service: Endoscopy;;  . ESOPHAGEAL BRUSHING  09/07/2019   Procedure: ESOPHAGEAL BRUSHING;  Surgeon: Wilford Corner, MD;  Location: [facility];  Service: Endoscopy;;  . ESOPHAGOGASTRODUODENOSCOPY (EGD) WITH PROPOFOL Left 09/07/2019   Procedure: ESOPHAGOGASTRODUODENOSCOPY (EGD) WITH PROPOFOL;  Surgeon: Wilford Corner, MD;  Location: [facility];  Service: Endoscopy;  Laterality: Left;    Current Medications: Current Meds  Medication Sig  . acetaminophen (TYLENOL) 500 MG tablet Take 500 mg by mouth every 8 (eight) hours as needed for mild pain or moderate pain.   Marland Kitchen atorvastatin (LIPITOR) 20 MG tablet TAKE 1 TABLET (20  MG TOTAL) BY MOUTH DAILY AT 6 PM.  . Blood Glucose Monitoring Suppl (ACCU-CHEK GUIDE) w/Device KIT 1 each by Does not apply route 4 (four) times daily -  before meals and at bedtime.  . Blood Glucose Monitoring Suppl (ONETOUCH VERIO) w/Device KIT Please use to check blood sugar three times daily. E11.9  . calcium-vitamin D (OSCAL WITH D) 500-200 MG-UNIT tablet Take 1 tablet by mouth daily with breakfast.  . carvedilol (COREG) 3.125 MG tablet Take 1 tablet (3.125 mg total) by mouth 2 (two) times daily.  . diclofenac Sodium (VOLTAREN) 1 % GEL Apply 4 g topically 4 (four) times daily.  . fesoterodine (TOVIAZ) 8 MG TB24 tablet Take 1 tablet (8 mg total) by mouth daily.  . fexofenadine (ALLEGRA ALLERGY) 60 MG tablet Take 1 tablet (60 mg total) by mouth as needed.  . fluticasone (FLONASE) 50 MCG/ACT nasal spray Place 2 sprays into both nostrils daily.  Elmore Guise Devices (ONE TOUCH DELICA LANCING DEV) MISC Please use to check blood sugar three times daily. E11.9  . metFORMIN (GLUCOPHAGE-XR) 500 MG 24 hr tablet Take 1 tablet (500 mg total) by mouth daily with breakfast.  . ondansetron (ZOFRAN-ODT) 4 MG disintegrating tablet Take 4 mg by mouth 3 (three) times daily as needed.  [name] 67E MISC Please use to check blood sugar three times daily. E11.9  . pantoprazole (PROTONIX) 40 MG tablet Take 1 tablet (40 mg total) by mouth daily as needed. (Patient taking differently: Take 40 mg by mouth daily as needed. Med  on hold)  . sennosides-docusate sodium (SENOKOT-S) 8.6-50 MG tablet Take 1 tablet by mouth daily.  . traMADol (ULTRAM) 50 MG tablet Take 1 tablet (50 mg total) by mouth every 8 (eight) hours as needed.  . [DISCONTINUED] amLODipine (NORVASC) 2.5 MG tablet Take 2.5 mg by mouth at bedtime.     Allergies:   Patient has no known allergies.   Social History   Socioeconomic History  . Marital status: Single    Spouse name: Not on file  . Number of children: Not on file  . Years of  education: Not on file  . Highest education level: Not on file  Occupational History  . Not on file  Tobacco Use  . Smoking status: Never Smoker  . Smokeless tobacco: Never Used  Substance and Sexual Activity  . Alcohol use: Not on file  . Drug use: Never  . Sexual activity: Never  Other Topics Concern  . Not on file  Social History Narrative  . Not on file   Social Determinants of Health   Financial Resource Strain: Not on file  Food Insecurity: Not on file  Transportation Needs: Not on file  Physical Activity: Not on file  Stress: Not on file  Social Connections: Not on file    Family History: The patient and niece deny family history of medical problems. No heart disease in family.  ROS:   Please see the history of present illness.    All other systems reviewed and are negative.  EKGs/Labs/Other Studies Reviewed:    The following studies were reviewed today:  EKG:   05/06/20 SR 91 no ST/T changes 09/10/19:  SR rate 90 no ST/T changes  Cardiac Event Monitoring: Date: 06/06/20 Results:  Patient had a minimum heart rate of 60 bpm, maximum heart rate of 184 bpm, and average heart rate of 85 bpm.  Predominant underlying rhythm was sinus rhythm.  One run of ventricular tachycardia occurred lasting 4 beats with a max rate of 146 bpm.  Ten runs of supraventricular tachycardia occurred lasting 12 beats at longest with a max rate of 184 bpm.  Isolated PACs were occasional (1.3%), with rare couplets and triplets present.  Isolated PVCs were rare (<1.0%), with rare couplets and bigeminy present.  No evidence of complete heart block.  Triggered and diary events associated with sinus rhythm and PACs.   Occasional PACs and supraventricular tachycardia. No malignant arrhythmias.   Recent Labs: 08/27/2019: B Natriuretic Peptide 49.7 09/04/2019: TSH 0.694 09/08/2019: Magnesium 1.8 01/19/2020: Hemoglobin 12.4; Platelets 203 04/04/2020: ALT 41; BUN 13; Creat 0.74;  Potassium 4.5; Sodium 139  Recent Lipid Panel    Component Value Date/Time   CHOL 95 08/26/2019 1530   TRIG 95 08/26/2019 1530   HDL 32 (L) 08/26/2019 1530   CHOLHDL 3.0 08/26/2019 1530   VLDL 19 08/26/2019 1530   LDLCALC 44 08/26/2019 1530    Risk Assessment/Calculations:   None  Physical Exam:    VS:  BP (!) 162/88   Pulse 81   Ht _0  (1.6 m)   Wt 186 lb 9.6 oz (84.6 kg)   SpO2 95%   BMI 33.05 kg/m     Wt Readings from Last 3 Encounters:  08/11/20 186 lb 9.6 oz (84.6 kg)  05/27/20 185 lb (83.9 kg)  05/06/20 182 lb (82.6 kg)    GEN: Well nourished, well developed in no acute distress HEENT: Normal NECK: No JVD; No carotid bruits LYMPHATICS: No lymphadenopathy CARDIAC: RRR, no murmurs, rubs, gallops RESPIRATORY:  Clear to auscultation without rales, wheezing or rhonchi  ABDOMEN: Soft, non-tender, non-distended MUSCULOSKELETAL:  No edema; No deformity  SKIN: Warm and dry NEUROLOGIC:  Alert and oriented x 3 PSYCHIATRIC:  Normal affect   ASSESSMENT:    1. PAC (premature atrial contraction)   2. Diabetes mellitus with coincident hypertension (HCC)    PLAN:    In order of problems listed above:  PACs Diabetes with hypertension - ambulatory blood pressure slightly above goal, will start ambulatory BP monitoring; gave education on how to perform ambulatory blood pressure monitoring including the frequency and technique; goal ambulatory blood pressure < 135/85 on average - continue home medications with the exception of an addition to 3.125 mg PO BID Coreg - discussed diet (DASH/low sodium), and exercise/weight loss interventions - will refill norvasc  Six months follow up unless new symptoms or abnormal test results warranting change in plan  Would be reasonable for  APP Follow up    Shared Decision Making/Informed Consent        Medication Adjustments/Labs and Tests Ordered: Current medicines are reviewed at length with the patient today.  Concerns  regarding medicines are outlined above.  No orders of the defined types were placed in this encounter.  Meds ordered this encounter  Medications  . amLODipine (NORVASC) 2.5 MG tablet    Sig: Take 1 tablet (2.5 mg total) by mouth at bedtime.    Dispense:  90 tablet    Refill:  3  . carvedilol (COREG) 3.125 MG tablet    Sig: Take 1 tablet (3.125 mg total) by mouth 2 (two) times daily.    Dispense:  180 tablet    Refill:  3    Patient Instructions  Medication Instructions:  Your physician has recommended you make the following change in your medication:   START: carvedilol (Coreg) 3.125mg  by mouth twice daily  *If you need a refill on your cardiac medications before your next appointment, please call your pharmacy*   Lab Work: NONE If you have labs (blood work) drawn today and your tests are completely normal, you will receive your results only by: Marland Kitchen MyChart Message (if you have MyChart) OR . A paper copy in the mail If you have any lab test that is abnormal or we need to change your treatment, we will call you to review the results.   Testing/Procedures: NONE   Follow-Up: At [facility], you and your health needs are our priority.  As part of our continuing mission to provide you with exceptional heart care, we have created designated Provider Care Teams.  These Care Teams include your primary Cardiologist (physician) and Advanced Practice Providers (APPs -  Physician Assistants and Nurse Practitioners) who all work together to provide you with the care you need, when you need it.  We recommend signing up for the patient portal called "MyChart".  Sign up information is provided on this After Visit Summary.  MyChart is used to connect with patients for Virtual Visits (Telemedicine).  Patients are able to view lab/test results, encounter notes, upcoming appointments, etc.  Non-urgent messages can be sent to your provider as well.   To learn more about what you can do with  MyChart, go to [email or web address].    Your next appointment:   6 month(s)  The format for your next appointment:   In Person  Provider:   You may see Gasper Sells, MD or one of the following Advanced Practice Providers on your designated Care Team:    Melina Copa,  PA-C  Ermalinda Barrios, PA-C        Signed, Werner Lean, MD  08/11/2020 4:24 PM    [place]

## 2020-08-12 ENCOUNTER — Ambulatory Visit (INDEPENDENT_AMBULATORY_CARE_PROVIDER_SITE_OTHER): Payer: Medicare Other | Admitting: Family Medicine

## 2020-08-12 ENCOUNTER — Encounter: Payer: Self-pay | Admitting: Family Medicine

## 2020-08-12 ENCOUNTER — Other Ambulatory Visit: Payer: Self-pay

## 2020-08-12 VITALS — BP 140/70 | HR 100 | Ht 63.0 in | Wt 184.1 lb

## 2020-08-12 DIAGNOSIS — M818 Other osteoporosis without current pathological fracture: Secondary | ICD-10-CM | POA: Diagnosis present

## 2020-08-12 DIAGNOSIS — B182 Chronic viral hepatitis C: Secondary | ICD-10-CM | POA: Diagnosis not present

## 2020-08-12 DIAGNOSIS — E119 Type 2 diabetes mellitus without complications: Secondary | ICD-10-CM

## 2020-08-12 DIAGNOSIS — M1711 Unilateral primary osteoarthritis, right knee: Secondary | ICD-10-CM | POA: Diagnosis not present

## 2020-08-12 DIAGNOSIS — I10 Essential (primary) hypertension: Secondary | ICD-10-CM

## 2020-08-12 LAB — POCT GLYCOSYLATED HEMOGLOBIN (HGB A1C): Hemoglobin A1C: 5.2 % (ref 4.0–5.6)

## 2020-08-12 MED ORDER — IBUPROFEN 800 MG PO TABS
800.0000 mg | ORAL_TABLET | Freq: Three times a day (TID) | ORAL | 0 refills | Status: DC | PRN
Start: 2020-08-12 — End: 2022-03-15

## 2020-08-12 MED ORDER — METHYLPREDNISOLONE ACETATE 40 MG/ML IJ SUSP
40.0000 mg | Freq: Once | INTRAMUSCULAR | Status: DC
Start: 2020-08-12 — End: 2022-03-15

## 2020-08-12 NOTE — Progress Notes (Signed)
SUBJECTIVE:   CHIEF COMPLAINT / HPI: knee pain  R Knee: Patient has had intermittent knee pain over the years that is easily controlled with voltaren gel and tylenol, but for the last week she has had increasing pain and swelling in R knee. Last night she had significant pain and throbbing that kept her awake all night. She has tried tylenol twice a day, ice, rest, and voltaren gel without improvement. She is having trouble walking with her cane, difficulty getting into and out of car, etc. Left knee is not as painful, only at baseline pain.   Osteoporosis: Prolia shot given in January, normal Ca at that time. Will recheck today.  DM: On metformin 500 mg daily, last A1c 5.6% on 05/27/20. Will recheck A1c today.  Hep C: Completed epclusa treatment with [facility]. Hep C RNA viral load <15 on most recent test in December, down from 6 million in June.  HTN: Mildly above goal in cardiology office yesterday. Recommended starting ambulatory BP monitoring, as well as 3.125 mg PO BID Coreg in addition to amlodipine.  Dizziness, palpitations: zio patch showed PACs, no recommended intervention at this time.  PERTINENT  PMH / PSH: DMT2, HTN  OBJECTIVE:   BP 140/70   Pulse 100   Ht 5\' 3"  (1.6 m)   Wt 184 lb 2 oz (83.5 kg)   SpO2 99%   BMI 32.62 kg/m   Nursing note and vitals reviewed GEN: age-appropriate African woman, resting comfortably in chair, NAD, obese HEENT: NCAT.Sclera without injection or icterus.  Cardiac: Regular rate and rhythm. Normal S1/S2. No murmurs, rubs, or gallops appreciated. 2+ radial pulses. Lungs: Clear bilaterally to ascultation. No increased WOB, no accessory muscle usage. No w/r/r. R knee:  Inspection was negative for erythema, ecchymosis, but positive for mild effusion. No obvious bony abnormalities or signs of osteophyte development. Palpation yielded no asymmetric warmth; Positive joint line tenderness on medial and lateral sides; No condyle tenderness; No patellar  tenderness; mild patellar crepitus. Patellar and quadriceps tendons unremarkable, and no tenderness of the pes anserine bursa. No obvious Baker's cyst development. ROM mildly lilmited in flexion (120 degrees) , but very limited in extension (30 degrees). Limited hamstring and quadriceps strength. Neurovascularly intact bilaterally. Neuro: Alert and at baseline, 2+ DTR patellar and achilles Ext: no edema Psych: Pleasant and appropriate  ASSESSMENT/PLAN:   Type 2 diabetes mellitus (HCC) A1c 5.2% today. 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 Plan to stop metformin today and recheck A1c in 5 months. Reminded re: ophtho.  Hypertension Patient very close to goal today. Started coreg 3.125mg  BID after recent cardiology visit. Will continue to monitor.  Chronic viral hepatitis C (HCC) Finished epclusa in January 2022. Hep C viral load <15.  Osteoporosis Recheck calcium today, prolia given in January 2022, Ca normal at that time.  Osteoarthritis of right knee Suspect flare of OA, as patient has history of intermittent knee pain that is now worse with mild effusion. Suspect OA due to age and history of pain, no trauma. Overall exam, knee is intact. Will obtain plain film radiography to confirm. Offered PT, HEP, CSI, and referral to sports med/ortho. Patient is acutely in pain and wishes for aspiration of effusion if possible with CSI injection today, see procedure note below. Patient has h/o severe gastritis 1 year ago that has since resolved, recommend short course of strong NSAIDs  to treat acute flare of pain. Recommend follow up in 1 month if pain persists or sooner if no relief with conservative tx, CSI, and NSAIDs. - ibuprofen 800 mg q6h through Sunday 2/27 - tylenol q6h prn thereafter with rest, ice, compression and voltaren gel - x-rays of knee   PROCEDURE: INJECTION: Patient  was given informed consent, signed copy in the chart. Appropriate time out was taken. Area prepped and draped in usual sterile fashion. Ethyl chloride was used for local anesthesia with 22 gauge needle with 3cc of 1% lidocaine without epi for local anesthesia. Then an 18g needle was advanced using a medial approach to aspirate effusion, unable to drain any fluid. Next, A 21 gauge 1 1/2 inch needle was used 1 cc of methylprednisolone 40 mg/ml plus  2 cc of 1% lidocaine without epinephrine was injected into the R knee using a medial approach.   The patient tolerated the procedure well. There were no complications. Post procedure instructions were given. Precepted with Dr. Deirdre Priest.     Shirlean Mylar, MD [facility]

## 2020-08-12 NOTE — Patient Instructions (Addendum)
It was a pleasure to see you today!  1. For your knee pain: please take 1 pill of ibuprofen 800 mg every 8 hours (three times a day) starting today and ending on Sunday, 08/14/20. Then continue to take tylenol as needed. Continue voltaren gel and ice as needed for pain. Come back in 1 month if continued pain.  2. Please get x-rays of your knees. This can be done between 8 AM and 5 Pm at your convenience at [facility].  3. We will check your calcium today.  4. hgb A1c down to 5.2% today! You can stop metformin and we will check your a1c in 3 months.   Be Well,  Dr. Leary Roca  Joint Injection, Care After Refer to this sheet in the next few weeks. These instructions provide you with information about caring for yourself after your procedure. Your health care provider may also give you more specific instructions. Your treatment has been planned according to current medical practices, but problems sometimes occur. Call your health care provider if you have any problems or questions after your procedure. What can I expect after the procedure? After the procedure, it is common to have:  Soreness.  Warmth.  Swelling. You may have more pain, swelling, and warmth than you did before the injection. This reaction may last for about one day. Follow these instructions at home: Bathing  If you were given a bandage (dressing), keep it dry until your health care provider says it can be removed. Ask your health care provider when you can start showering or taking a bath. Managing pain, stiffness, and swelling  If directed, apply ice to the injection area:  Put ice in a plastic bag.  Place a towel between your skin and the bag.  Leave the ice on for 20 minutes, 2-3 times per day.  Do not apply heat to your knee.  Raise the injection area above the level of your heart while you are sitting or lying down. Activity  Avoid strenuous activities for as long as directed by your health care  provider. Ask your health care provider when you can return to your normal activities. General instructions  Take medicines only as directed by your health care provider.  Do not take aspirin or other over-the-counter medicines unless your health care provider says you can.  Check your injection site every day for signs of infection. Watch for:  Redness, swelling, or pain.  Fluid, blood, or pus.  Follow your health care provider's instructions about dressing changes and removal. Contact a health care provider if:  You have symptoms at your injection site that last longer than two days after your procedure.  You have redness, swelling, or pain in your injection area.  You have fluid, blood, or pus coming from your injection site.  You have warmth in your injection area.  You have a fever.  Your pain is not controlled with medicine. Get help right away if:

## 2020-08-13 LAB — BASIC METABOLIC PANEL
BUN/Creatinine Ratio: 19 (ref 12–28)
BUN: 13 mg/dL (ref 8–27)
CO2: 22 mmol/L (ref 20–29)
Calcium: 10.7 mg/dL — ABNORMAL HIGH (ref 8.7–10.3)
Chloride: 100 mmol/L (ref 96–106)
Creatinine, Ser: 0.7 mg/dL (ref 0.57–1.00)
GFR calc Af Amer: 100 mL/min/{1.73_m2} (ref >59)
GFR calc non Af Amer: 87 mL/min/{1.73_m2} (ref >59)
Glucose: 121 mg/dL — ABNORMAL HIGH (ref 65–99)
Potassium: 4 mmol/L (ref 3.5–5.2)
Sodium: 141 mmol/L (ref 134–144)

## 2020-08-14 ENCOUNTER — Other Ambulatory Visit: Payer: Self-pay | Admitting: Infectious Diseases

## 2020-08-14 DIAGNOSIS — E785 Hyperlipidemia, unspecified: Secondary | ICD-10-CM

## 2020-08-14 DIAGNOSIS — M1711 Unilateral primary osteoarthritis, right knee: Secondary | ICD-10-CM | POA: Insufficient documentation

## 2020-08-14 DIAGNOSIS — E1169 Type 2 diabetes mellitus with other specified complication: Secondary | ICD-10-CM

## 2020-08-14 NOTE — Assessment & Plan Note (Signed)
A1c 5.2% today. 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 Plan to stop metformin today and recheck A1c in 5 months. Reminded re: ophtho.

## 2020-08-14 NOTE — Assessment & Plan Note (Addendum)
Suspect flare of OA, as patient has history of intermittent knee pain that is now worse with mild effusion. Suspect OA due to age and history of pain, no trauma. Overall exam, knee is intact. Will obtain plain film radiography to confirm. Offered PT, HEP, CSI, and referral to sports med/ortho. Patient is acutely in pain and wishes for aspiration of effusion if possible with CSI injection today, see procedure note below. Patient has h/o severe gastritis 1 year ago that has since resolved, recommend short course of strong NSAIDs to treat acute flare of pain. Recommend follow up in 1 month if pain persists or sooner if no relief with conservative tx, CSI, and NSAIDs. - ibuprofen 800 mg q6h through Sunday 2/27 - tylenol q6h prn thereafter with rest, ice, compression and voltaren gel - x-rays of knee   PROCEDURE: INJECTION: Patient was given informed consent, signed copy in the chart. Appropriate time out was taken. Area prepped and draped in usual sterile fashion. Ethyl chloride was used for local anesthesia with 22 gauge needle with 3cc of 1% lidocaine without epi for local anesthesia. Then an 18g needle was advanced using a medial approach to aspirate effusion, unable to drain any fluid. Next, A 21 gauge 1 1/2 inch needle was used 1 cc of methylprednisolone 40 mg/ml plus  2 cc of 1% lidocaine without epinephrine was injected into the R knee using a medial approach.   The patient tolerated the procedure well. There were no complications. Post procedure instructions were given. Precepted with Dr. Deirdre Priest.

## 2020-08-14 NOTE — Assessment & Plan Note (Signed)
Patient very close to goal today. Started coreg 3.125mg  BID after recent cardiology visit. Will continue to monitor.

## 2020-08-14 NOTE — Assessment & Plan Note (Signed)
Finished epclusa in January 2022. Hep C viral load <15.

## 2020-08-14 NOTE — Assessment & Plan Note (Signed)
Recheck calcium today, prolia given in January 2022, Ca normal at that time.

## 2020-08-19 ENCOUNTER — Ambulatory Visit
Admission: RE | Admit: 2020-08-19 | Discharge: 2020-08-19 | Disposition: A | Discharge status: Home / Self Care | Payer: Medicare Other | Source: Ambulatory Visit | Attending: Family Medicine | Admitting: Family Medicine

## 2020-08-19 ENCOUNTER — Other Ambulatory Visit: Payer: Self-pay | Admitting: Family Medicine

## 2020-08-19 DIAGNOSIS — M1711 Unilateral primary osteoarthritis, right knee: Secondary | ICD-10-CM

## 2020-08-22 ENCOUNTER — Other Ambulatory Visit: Payer: Self-pay

## 2020-08-22 ENCOUNTER — Ambulatory Visit (INDEPENDENT_AMBULATORY_CARE_PROVIDER_SITE_OTHER): Payer: Medicare Other | Admitting: Infectious Diseases

## 2020-08-22 ENCOUNTER — Encounter: Payer: Self-pay | Admitting: Infectious Diseases

## 2020-08-22 VITALS — BP 154/88 | HR 70 | Temp 98.0°F | Resp 16 | Ht 63.0 in | Wt 185.4 lb

## 2020-08-22 DIAGNOSIS — B182 Chronic viral hepatitis C: Secondary | ICD-10-CM

## 2020-08-22 DIAGNOSIS — K7460 Unspecified cirrhosis of liver: Secondary | ICD-10-CM | POA: Diagnosis not present

## 2020-08-22 NOTE — Progress Notes (Signed)
Patient: Tracey Riddle  DOB: 24-Jan-1951 MRN: [id number] PCP: Gladys Damme, MD    Subjective:  Tracey Riddle is a 70 y.o. female here for follow up on strongyloidiasis gastritis and chronic Hepatitis C, Genotype 4, F4.   They declined in person interpretor and prefer her niece to interpret.     HPI: She continues to feel very well and is without any GI complaints.  Completed her Hepatitis C medication 3 months ago now.  No complaints or concerns today.   Denies any abdominal pain, bloating, swelling, diarrhea, changes to BMs. Eating well with good appetite. Denies any alcohol.    Review of Systems  Constitutional: Negative for appetite change, fatigue, fever and unexpected weight change.  Respiratory: Negative for shortness of breath.   Cardiovascular: Negative for chest pain and leg swelling.  Gastrointestinal: Negative for abdominal pain, blood in stool, nausea and vomiting.  Genitourinary: Negative for difficulty urinating and hematuria.  Musculoskeletal: Negative for arthralgias.  Skin: Negative for color change.  Neurological: Negative for dizziness, tremors and headaches.  Hematological: Negative for adenopathy.  Psychiatric/Behavioral: Negative for confusion.     Past Medical History:  Diagnosis Date  . 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  . Diabetes mellitus type II, controlled ([place]) 08/27/2019  . Hepatitis C     Outpatient Medications Prior to Visit  Medication Sig Dispense Refill  . acetaminophen (TYLENOL) 500 MG tablet Take 500 mg by mouth every 8 (eight) hours as needed for mild pain or moderate pain.     Marland Kitchen amLODipine (NORVASC) 2.5 MG tablet Take 1 tablet (2.5 mg total) by mouth at bedtime. 90 tablet 3  . atorvastatin (LIPITOR) 20 MG tablet TAKE 1 TABLET BY MOUTH DAILY AT 6 PM. 30 tablet 0  . Blood Glucose Monitoring Suppl (ACCU-CHEK GUIDE) w/Device KIT 1 each by  Does not apply route 4 (four) times daily -  before meals and at bedtime. 1 kit 3  . Blood Glucose Monitoring Suppl (ONETOUCH VERIO) w/Device KIT Please use to check blood sugar three times daily. E11.9 1 kit 0  . calcium-vitamin D (OSCAL WITH D) 500-200 MG-UNIT tablet Take 1 tablet by mouth daily with breakfast. 30 tablet 11  . carvedilol (COREG) 3.125 MG tablet Take 1 tablet (3.125 mg total) by mouth 2 (two) times daily. 180 tablet 3  . diclofenac Sodium (VOLTAREN) 1 % GEL Apply 4 g topically 4 (four) times daily. 100 g 3  . fesoterodine (TOVIAZ) 8 MG TB24 tablet Take 1 tablet (8 mg total) by mouth daily. 30 tablet 1  . fexofenadine (ALLEGRA ALLERGY) 60 MG tablet Take 1 tablet (60 mg total) by mouth as needed. 30 tablet 6  . fluticasone (FLONASE) 50 MCG/ACT nasal spray Place 2 sprays into both nostrils daily. 16 g 6  . ibuprofen (ADVIL) 800 MG tablet Take 1 tablet (800 mg total) by mouth every 8 (eight) hours as needed. 30 tablet 0  . Lancet Devices (ONE TOUCH DELICA LANCING DEV) MISC Please use to check blood sugar three times daily. E11.9 1 each 0  . ondansetron (ZOFRAN-ODT) 4 MG disintegrating tablet Take 4 mg by mouth 3 (three) times daily as needed.    [name] 66Y MISC Please use to check blood sugar three times daily. E11.9 100 each 0  . sennosides-docusate sodium (SENOKOT-S) 8.6-50 MG tablet Take 1 tablet by mouth daily. 30 tablet 3  . traMADol (  ULTRAM) 50 MG tablet Take 1 tablet (50 mg total) by mouth every 8 (eight) hours as needed. 15 tablet 1  . metFORMIN (GLUCOPHAGE-XR) 500 MG 24 hr tablet Take 1 tablet (500 mg total) by mouth daily with breakfast. 30 tablet 3  . pantoprazole (PROTONIX) 40 MG tablet Take 1 tablet (40 mg total) by mouth daily as needed. (Patient taking differently: Take 40 mg by mouth daily as needed. Med on hold) 180 tablet 3   Facility-Administered Medications Prior to Visit  Medication Dose Route Frequency Provider Last Rate Last Admin  .  methylPREDNISolone acetate (DEPO-MEDROL) injection 40 mg  40 mg Intramuscular Once Gladys Damme, MD         No Known Allergies  Social History   Tobacco Use  . Smoking status: Never Smoker  . Smokeless tobacco: Never Used  Substance Use Topics  . Drug use: Never    No family history on file.  Objective:   Vitals:   08/22/20 1549  BP: (!) 154/88  Pulse: 70  Resp: 16  Temp: 98 F (36.7 C)  SpO2: 98%  Weight: 185 lb 6.4 oz (84.1 kg)  Height: _0  (1.6 m)   Body mass index is 32.84 kg/m.  Physical Exam Vitals reviewed.  Constitutional:      Appearance: Normal appearance. She is not ill-appearing.  HENT:     Mouth/Throat:     Mouth: Mucous membranes are moist.     Pharynx: Oropharynx is clear.  Eyes:     General: No scleral icterus. Pulmonary:     Effort: Pulmonary effort is normal.  Neurological:     Mental Status: She is oriented to person, place, and time.  Psychiatric:        Mood and Affect: Mood normal.        Thought Content: Thought content normal.       Lab Results: Lab Results  Component Value Date   WBC 6.1 01/19/2020   HGB 12.4 01/19/2020   HCT 39.2 01/19/2020   MCV 84.8 01/19/2020   PLT 203 01/19/2020    Lab Results  Component Value Date   CREATININE 0.70 08/12/2020   BUN 13 08/12/2020   NA 141 08/12/2020   K 4.0 08/12/2020   CL 100 08/12/2020   CO2 22 08/12/2020    Lab Results  Component Value Date   ALT 41 (H) 04/04/2020   AST 40 (H) 04/04/2020   GGT 195 (H) 01/19/2020   ALKPHOS 57 09/06/2019   BILITOT 0.4 04/04/2020     Assessment & Plan:   Problem List Items Addressed This Visit      Medium   Chronic viral hepatitis C ([place]) - Primary    Genotype 4 with Cirrhosis, compensated.  She completed her course of Epclusa and had undetectable hepatitis c rna during the course of therapy through her end of treatment. Will check RNA again for SVR at 3 months following completion.  Counseled that this dramatically reduces  the risk for hepatic carcinoma going forward but does not eliminated it 100%. Will continue to arrange screenings for [facility] with Q54mAFP + Ultrasound. AFP will be drawn today and [place].   Return in 6 months with [place] labs including CMP, CBC, INR, AFP.       Relevant Orders   AFP tumor marker   Hepatitis C RNA quantitative ([facility])    Other Visit Diagnoses    Cirrhosis of liver without ascites, unspecified hepatic cirrhosis type ([place]  Relevant Orders   US ABDOMEN LIMITED RUQ (LIVER/GB)      Janene Madeira, MSN, NP-C [facility] [place] [facility] Pager: [phone] Office: [phone]

## 2020-08-22 NOTE — Patient Instructions (Signed)
Please schedule your liver ultrasound   Please stop by the lab on your way out as well.   Will call you with your results of blood work and ultrasound.    The ultrasounds are surveillance now to screen for cancer. We know that if we cure your hepatitis C it significantly reduces the risk for liver cancer, but not 100%.   I am happy to help arrange screenings for you twice a year and a visit once a year.

## 2020-08-22 NOTE — Assessment & Plan Note (Signed)
Genotype 4 with Cirrhosis, compensated.  She completed her course of Epclusa and had undetectable hepatitis c rna during the course of therapy through her end of treatment. Will check RNA again for SVR at 3 months following completion.  Counseled that this dramatically reduces the risk for hepatic carcinoma going forward but does not eliminated it 100%. Will continue to arrange screenings for [facility] with Q6m AFP + Ultrasound. AFP will be drawn today and [place] this week.   Return in 6 months with [place] and labs including CMP, CBC, INR, AFP.

## 2020-08-24 LAB — HEPATITIS C RNA QUANTITATIVE
HCV Quantitative Log: <1.18 log IU/mL
HCV RNA, PCR, QN: <15 IU/mL

## 2020-08-24 LAB — AFP TUMOR MARKER: AFP-Tumor Marker: 5.6 ng/mL

## 2020-08-25 ENCOUNTER — Telehealth: Payer: Self-pay | Admitting: Family Medicine

## 2020-08-25 NOTE — Telephone Encounter (Signed)
Discussed imaging results with patient's niece who is her preferred interpreter. Her knee radiographs show mild osteoarthritis, particularly on the medial side. This is reassuring that her OA is so mild, suspect she had a flare of pain recently and niece reports that this has completely resolved after CSI.  Shirlean Mylar, MD [facility], PGY-2

## 2020-09-06 ENCOUNTER — Other Ambulatory Visit: Payer: Self-pay | Admitting: Infectious Diseases

## 2020-09-06 ENCOUNTER — Ambulatory Visit
Admission: RE | Admit: 2020-09-06 | Discharge: 2020-09-06 | Disposition: A | Discharge status: Home / Self Care | Payer: Medicare Other | Source: Ambulatory Visit | Attending: Infectious Diseases | Admitting: Infectious Diseases

## 2020-09-06 DIAGNOSIS — K7460 Unspecified cirrhosis of liver: Secondary | ICD-10-CM

## 2020-09-06 DIAGNOSIS — E785 Hyperlipidemia, unspecified: Secondary | ICD-10-CM

## 2020-09-06 DIAGNOSIS — E1169 Type 2 diabetes mellitus with other specified complication: Secondary | ICD-10-CM

## 2020-09-08 ENCOUNTER — Other Ambulatory Visit: Payer: Self-pay | Admitting: Family Medicine

## 2020-09-08 DIAGNOSIS — N3941 Urge incontinence: Secondary | ICD-10-CM

## 2020-09-09 ENCOUNTER — Other Ambulatory Visit: Payer: Self-pay

## 2020-09-09 DIAGNOSIS — R1084 Generalized abdominal pain: Secondary | ICD-10-CM

## 2020-09-12 MED ORDER — TRAMADOL HCL 50 MG PO TABS: 50.0000 mg | ORAL_TABLET | Freq: Three times a day (TID) | ORAL | 2 refills | Status: DC | PRN

## 2020-11-08 ENCOUNTER — Telehealth: Payer: Self-pay

## 2020-11-11 ENCOUNTER — Other Ambulatory Visit: Payer: Self-pay | Admitting: Family Medicine

## 2020-11-28 ENCOUNTER — Other Ambulatory Visit: Payer: Self-pay

## 2020-11-28 ENCOUNTER — Ambulatory Visit (INDEPENDENT_AMBULATORY_CARE_PROVIDER_SITE_OTHER): Payer: Medicare Other | Admitting: Family Medicine

## 2020-11-28 VITALS — BP 147/86 | HR 78 | Wt 188.8 lb

## 2020-11-28 DIAGNOSIS — R519 Headache, unspecified: Secondary | ICD-10-CM

## 2020-11-28 MED ORDER — PREDNISONE 20 MG PO TABS
60.0000 mg | ORAL_TABLET | Freq: Every day | ORAL | 0 refills | Status: DC
Start: 2020-11-28 — End: 2021-09-03

## 2020-11-28 NOTE — Patient Instructions (Signed)
The headache can be from multiple things but with her symptoms, I am concerned for something called temporal arteritis.  Given to start high-dose prednisone right away.  Please get this medication after your visit.  It has been sent to the pharmacy.  We will also get lab testing.  Depending on what this lab testing shows, will determine further work-up.  We will call you with the results tomorrow.

## 2020-11-28 NOTE — Assessment & Plan Note (Signed)
Differential includes temporal arteritis given age and temporal tenderness. Will obtain ESR, CRP and start patient on prednisone 60 mg. Will determine further management based on lab finding. Also consider migraine, tension type headache if further work up negative. Also consider medication side effect - coreg started in early May, though less likely.

## 2020-11-28 NOTE — Progress Notes (Signed)
    SUBJECTIVE:  CHIEF COMPLAINT / HPI:   Headaches  X two weeks. Location: temples and then goes to the top of her head and then neck. It starts as a small pain but slowly increases over time. No thunderclap or sudden pain. Quality: pounding pain. Headaches last for 30 minutes or longer. She uses towel or ice with relief, but headache always comes back as well as tylenol with no relief. Worsened with bending down. Has not notieced anything that brings on the headaches. Occurs almost everyday, sometimes all day, or episodic throughout the day. Never experienced this previously. No hx of migraines. Sometimes has some dizziness, but no falls. Denies any vision changes or floaters, sensitivity to light.  Started on Coreg 31.25 BID on 08/11/20 and reports starting to take right away. Does not notice any pattern.  Does report jaw pain with eating. No fevers, but does have fatigue.   Accompanied by niece who is primary caretaker (serves as Optometrist today) who doesn't live with her, but checks in on her everyday.   PERTINENT  PMH / PSH: DMII, OA knee, osteoporosis, HTN, chronic hep C   PHQ9 SCORE ONLY 08/12/2020 05/27/2020 05/04/2020  PHQ-9 Total Score 0 0 0   OBJECTIVE:  BP (!) 147/86   Pulse 78   Wt 188 lb 12.8 oz (85.6 kg)   SpO2 99%   BMI 33.44 kg/m   General: well appearing, NAD  HEENT: NCAT. TTP of temporal area. No TTP or clicking of TMJ. No obvious photophobia. EOMI. PERRLA.  Neuro: Cranial nerves intact, no FND.  Chest: RRR. No murmurs.  Lungs: CTAB.  MSK: No BLEE   ASSESSMENT/PLAN:  Headache, temporal Differential includes temporal arteritis given age and temporal tenderness. Will obtain ESR, CRP and start patient on prednisone 60 mg. Will determine further management based on lab finding. Also consider migraine, tension type headache if further work up negative. Also consider medication side effect - coreg started in early May, though less likely.    Wilber Oliphant, MD [place]

## 2020-11-29 LAB — C-REACTIVE PROTEIN: CRP: 1 mg/L (ref 0–10)

## 2020-11-29 LAB — SEDIMENTATION RATE: Sed Rate: 9 mm/hr (ref 0–40)

## 2020-11-29 NOTE — Addendum Note (Signed)
Addended by: Genia Hotter E on: 11/29/2020 02:55 PM   Modules accepted: Orders

## 2020-12-01 NOTE — Progress Notes (Signed)
MRI scheduled for Friday 6/24 @ 5:00 pm. Niece has been informed and agreed to appt. Sunday Spillers, CMA

## 2020-12-06 ENCOUNTER — Other Ambulatory Visit: Payer: Self-pay

## 2020-12-06 DIAGNOSIS — R1084 Generalized abdominal pain: Secondary | ICD-10-CM

## 2020-12-07 ENCOUNTER — Other Ambulatory Visit: Payer: Self-pay | Admitting: Infectious Diseases

## 2020-12-07 DIAGNOSIS — E785 Hyperlipidemia, unspecified: Secondary | ICD-10-CM

## 2020-12-08 MED ORDER — TRAMADOL HCL 50 MG PO TABS: 50.0000 mg | ORAL_TABLET | Freq: Three times a day (TID) | ORAL | 2 refills | Status: DC | PRN

## 2020-12-09 ENCOUNTER — Ambulatory Visit (HOSPITAL_COMMUNITY)
Admission: RE | Admit: 2020-12-09 | Discharge: 2020-12-09 | Disposition: A | Discharge status: Home / Self Care | Payer: Medicare Other | Source: Ambulatory Visit | Attending: Family Medicine | Admitting: Family Medicine

## 2020-12-09 ENCOUNTER — Other Ambulatory Visit: Payer: Self-pay

## 2020-12-09 DIAGNOSIS — R519 Headache, unspecified: Secondary | ICD-10-CM | POA: Insufficient documentation

## 2020-12-31 ENCOUNTER — Other Ambulatory Visit: Payer: Self-pay | Admitting: Family Medicine

## 2020-12-31 DIAGNOSIS — N3941 Urge incontinence: Secondary | ICD-10-CM

## 2021-01-04 ENCOUNTER — Other Ambulatory Visit: Payer: Self-pay | Admitting: Family Medicine

## 2021-01-04 DIAGNOSIS — J302 Other seasonal allergic rhinitis: Secondary | ICD-10-CM

## 2021-03-04 ENCOUNTER — Other Ambulatory Visit: Payer: Self-pay | Admitting: Family Medicine

## 2021-03-04 DIAGNOSIS — E785 Hyperlipidemia, unspecified: Secondary | ICD-10-CM

## 2021-03-06 ENCOUNTER — Other Ambulatory Visit: Payer: Self-pay

## 2021-03-06 ENCOUNTER — Encounter: Payer: Self-pay | Admitting: Infectious Diseases

## 2021-03-06 ENCOUNTER — Ambulatory Visit (INDEPENDENT_AMBULATORY_CARE_PROVIDER_SITE_OTHER): Payer: Medicare Other | Admitting: Infectious Diseases

## 2021-03-06 VITALS — BP 156/91 | HR 83 | Temp 98.3°F | Wt 194.0 lb

## 2021-03-06 DIAGNOSIS — K7460 Unspecified cirrhosis of liver: Secondary | ICD-10-CM | POA: Diagnosis present

## 2021-03-06 DIAGNOSIS — Z8619 Personal history of other infectious and parasitic diseases: Secondary | ICD-10-CM

## 2021-03-06 NOTE — Patient Instructions (Addendum)
Nice to see you!   Please stop by the lab on your way out today. Schedule a liver ultrasound for this month.    Start taking an antihistamine (zyrtec or allegra or claritin) once a day. It should help your cough after about a week of dosing.    Plan to return in 6 months with a lab visit 2 weeks before.

## 2021-03-06 NOTE — Progress Notes (Signed)
Patient: Tracey Riddle  DOB: 1950/09/02 MRN: [id number] PCP: Gladys Damme, MD    Subjective:  Tracey Riddle is a 70 y.o. female here for follow up on strongyloidiasis gastritis and chronic Hepatitis C, Genotype 4, F4.    HPI: They declined in person interpretor and prefer her niece to interpret.  Here for follow up s/p hepatitis c treatment, compensated cirrhosis.   Last liver ultrasound in March with no changes, no masses or tumors. No changes to her health or medications. Denies any abdominal pain, bloating, swelling, diarrhea, changes to BMs. Eating well with good appetite. Denies any alcohol.   Only concern today is coughing that has been present for about 3 weeks. She has no other associated symptoms aside from watery eyes. She has been using nasal spray.     Review of Systems  Constitutional:  Negative for appetite change, fatigue, fever and unexpected weight change.  Respiratory:  Negative for shortness of breath.   Cardiovascular:  Negative for chest pain and leg swelling.  Gastrointestinal:  Negative for abdominal pain, blood in stool, nausea and vomiting.  Genitourinary:  Negative for difficulty urinating and hematuria.  Musculoskeletal:  Negative for arthralgias.  Skin:  Negative for color change.  Neurological:  Negative for dizziness, tremors and headaches.  Hematological:  Negative for adenopathy.  Psychiatric/Behavioral:  Negative for confusion.     Past Medical History:  Diagnosis Date   Abnormal liver CT 08/27/2019   CT AP ([facility]/19, [facility]): Mildly nodular hepatic contour c/w possible mild cirrhosis.   Diabetes mellitus type II, controlled ([place]) 08/27/2019   Hepatitis C     Outpatient Medications Prior to Visit  Medication Sig Dispense Refill   acetaminophen (TYLENOL) 500 MG tablet Take 500 mg by mouth every 8 (eight) hours as needed for mild pain or moderate pain.      amLODipine (NORVASC) 2.5 MG tablet Take 1 tablet (2.5 mg total) by  mouth at bedtime. 90 tablet 3   atorvastatin (LIPITOR) 20 MG tablet TAKE 1 TABLET BY MOUTH DAILY AT 6 PM. 90 tablet 0   Blood Glucose Monitoring Suppl (ONETOUCH VERIO) w/Device KIT Please use to check blood sugar three times daily. E11.9 1 kit 0   Calcium Carb-Cholecalciferol (OYSTER SHELL CALCIUM W/D) 500-200 MG-UNIT TABS Take 1 tablet by mouth every morning.     calcium-vitamin D (OSCAL WITH D) 500-200 MG-UNIT tablet Take 1 tablet by mouth daily with breakfast. 30 tablet 11   diclofenac Sodium (VOLTAREN) 1 % GEL Apply 4 g topically 4 (four) times daily. 100 g 3   fexofenadine (ALLEGRA ALLERGY) 60 MG tablet Take 1 tablet (60 mg total) by mouth as needed. 30 tablet 6   fluticasone (FLONASE) 50 MCG/ACT nasal spray SPRAY 2 SPRAYS INTO EACH NOSTRIL EVERY DAY 48 mL 2   ibuprofen (ADVIL) 800 MG tablet Take 1 tablet (800 mg total) by mouth every 8 (eight) hours as needed. 30 tablet 0   Lancet Devices (ONE TOUCH DELICA LANCING DEV) MISC Please use to check blood sugar three times daily. E11.9 1 each 0   ondansetron (ZOFRAN-ODT) 4 MG disintegrating tablet Take 4 mg by mouth 3 (three) times daily as needed.     OneTouch Delica Lancets 27M MISC Please use to check blood sugar three times daily. E11.9 100 each 0   predniSONE (DELTASONE) 20 MG tablet Take 3 tablets (60 mg total) by mouth daily with breakfast. 30 tablet 0   PROLIA 60 MG/ML SOSY injection TO BE ADMINISTERED  IN PHYSICIAN'S OFFICE. INJECT ONE SYRINGE SUBCOUSLY ONCE EVERY 6 MONTHS. REFRIGERATE. USE WITHIN 14 DAYS ONCE AT ROOM TEMPERATURE. 1 mL 0   sennosides-docusate sodium (SENOKOT-S) 8.6-50 MG tablet Take 1 tablet by mouth daily. 30 tablet 3   TOVIAZ 8 MG TB24 tablet TAKE 1 TABLET BY MOUTH EVERY DAY 30 tablet 3   ACCU-CHEK GUIDE test strip TEST 4 TIMES A DAY (MAX 3 ON MEDICARE)     atorvastatin (LIPITOR) 40 MG tablet Take 1 tablet by mouth daily.     Blood Glucose Monitoring Suppl (ACCU-CHEK GUIDE) w/Device KIT 1 each by Does not apply route 4  (four) times daily -  before meals and at bedtime. 1 kit 3   carvedilol (COREG) 3.125 MG tablet Take 1 tablet (3.125 mg total) by mouth 2 (two) times daily. 180 tablet 3   traMADol (ULTRAM) 50 MG tablet Take 1 tablet (50 mg total) by mouth every 8 (eight) hours as needed. 15 tablet 2   Facility-Administered Medications Prior to Visit  Medication Dose Route Frequency Provider Last Rate Last Admin   methylPREDNISolone acetate (DEPO-MEDROL) injection 40 mg  40 mg Intramuscular Once Gladys Damme, MD         No Known Allergies  Social History   Tobacco Use   Smoking status: Never   Smokeless tobacco: Never  Substance Use Topics   Drug use: Never    No family history on file.  Objective:   Vitals:   03/06/21 1600  BP: (!) 156/91  Pulse: 83  Temp: 98.3 F (36.8 C)  TempSrc: Oral  Weight: 194 lb (88 kg)   Body mass index is 34.37 kg/m.  Physical Exam    Lab Results: Lab Results  Component Value Date   WBC 6.1 01/19/2020   HGB 12.4 01/19/2020   HCT 39.2 01/19/2020   MCV 84.8 01/19/2020   PLT 203 01/19/2020    Lab Results  Component Value Date   CREATININE 0.70 08/12/2020   BUN 13 08/12/2020   NA 141 08/12/2020   K 4.0 08/12/2020   CL 100 08/12/2020   CO2 22 08/12/2020    Lab Results  Component Value Date   ALT 41 (H) 04/04/2020   AST 40 (H) 04/04/2020   GGT 195 (H) 01/19/2020   ALKPHOS 57 09/06/2019   BILITOT 0.4 04/04/2020     Assessment & Plan:   Problem List Items Addressed This Visit       Medium   Hepatitis C virus infection cured after antiviral drug therapy    Will recheck Hep C rna, AFP, LFT today.  Ultrasound for [place] screening due - will arrange this month.   RTC in 11mfor ongoing screening after repeat labs and ultrasound done then. They prefer in person visits to review given language barrier.       Relevant Orders   AFP tumor marker   Hepatic function panel   Other Visit Diagnoses     Cirrhosis of liver without ascites,  unspecified hepatic cirrhosis type ([place]    -  Primary   Relevant Orders   [place] LIMITED RUQ (LIVER/GB)   Hepatitis C RNA quantitative ([facility])   AFP tumor marker   AFP tumor marker   Hepatic function panel      SJanene Madeira MSN, NP-C [facility]edar HillsPager: [phone]: [phone]

## 2021-03-06 NOTE — Assessment & Plan Note (Signed)
Will recheck Hep C rna, AFP, LFT today.  Ultrasound for HCC screening due - will arrange this month.   RTC in 67m for ongoing screening after repeat labs and ultrasound done then. They prefer in person visits to review given language barrier.

## 2021-03-08 LAB — AFP TUMOR MARKER: AFP-Tumor Marker: 5.2 ng/mL

## 2021-03-08 LAB — HEPATITIS C RNA QUANTITATIVE
HCV Quantitative Log: <1.18 log IU/mL
HCV RNA, PCR, QN: <15 IU/mL

## 2021-03-14 ENCOUNTER — Ambulatory Visit (HOSPITAL_COMMUNITY)
Admission: RE | Admit: 2021-03-14 | Discharge: 2021-03-14 | Disposition: A | Discharge status: Home / Self Care | Payer: Medicare Other | Source: Ambulatory Visit | Attending: Infectious Diseases | Admitting: Infectious Diseases

## 2021-03-14 ENCOUNTER — Other Ambulatory Visit: Payer: Self-pay

## 2021-03-14 DIAGNOSIS — K7460 Unspecified cirrhosis of liver: Secondary | ICD-10-CM | POA: Diagnosis present

## 2021-03-17 NOTE — Progress Notes (Signed)
Please call Ms. Tracey Riddle (her niece Gar Gibbon is the contact) to let her know that her liver ultrasound shows no changes which is exactly what we want.   Will need to have them call to set up a repeat liver ultrasound and lab check in 6 months with an office visit to review 2 weeks later please.   Thank you

## 2021-03-20 ENCOUNTER — Telehealth: Payer: Self-pay

## 2021-03-20 NOTE — Telephone Encounter (Signed)
Staff reached out to niece Karenann Cai) made her aware of most recent ultrasound result and accepted follow up visits in 6 months.  Will route to referral coordinator to schedule ultrasound.

## 2021-03-20 NOTE — Telephone Encounter (Signed)
Error

## 2021-03-20 NOTE — Telephone Encounter (Signed)
-----   Message from Blanchard Kelch, NP sent at 03/17/2021  1:53 PM EDT ----- Please call Ms. Mcnerney (her niece Gar Gibbon is the contact) to let her know that her liver ultrasound shows no changes which is exactly what we want.   Will need to have them call to set up a repeat liver ultrasound and lab check in 6 months with an office visit to review 2 weeks later please.   Thank you

## 2021-04-17 LAB — HM DIABETES EYE EXAM

## 2021-04-19 ENCOUNTER — Other Ambulatory Visit: Payer: Self-pay | Admitting: Family Medicine

## 2021-04-19 DIAGNOSIS — N3941 Urge incontinence: Secondary | ICD-10-CM

## 2021-05-14 ENCOUNTER — Other Ambulatory Visit: Payer: Self-pay | Admitting: Family Medicine

## 2021-05-14 DIAGNOSIS — M818 Other osteoporosis without current pathological fracture: Secondary | ICD-10-CM

## 2021-05-16 ENCOUNTER — Other Ambulatory Visit: Payer: Self-pay | Admitting: Family Medicine

## 2021-05-16 DIAGNOSIS — E119 Type 2 diabetes mellitus without complications: Secondary | ICD-10-CM

## 2021-06-01 ENCOUNTER — Encounter: Payer: Self-pay | Admitting: Family Medicine

## 2021-07-25 ENCOUNTER — Other Ambulatory Visit: Payer: Self-pay | Admitting: Internal Medicine

## 2021-07-25 DIAGNOSIS — I10 Essential (primary) hypertension: Secondary | ICD-10-CM

## 2021-07-25 DIAGNOSIS — E119 Type 2 diabetes mellitus without complications: Secondary | ICD-10-CM

## 2021-07-25 DIAGNOSIS — I491 Atrial premature depolarization: Secondary | ICD-10-CM

## 2021-08-04 ENCOUNTER — Other Ambulatory Visit: Payer: Medicare Other

## 2021-08-07 IMAGING — MR MR HEAD W/O CM
12 of 13 series · 44 of 48 positions shown · non-contrast
Comparison: None.

CLINICAL DATA: 70-year-old female with new or worsening headache.
Temporal pain.

EXAM:
MRI HEAD WITHOUT CONTRAST
TECHNIQUE: Multiplanar, multiecho pulse sequences of the brain and surrounding
structures were obtained without intravenous contrast.

[Series 5: DWI · axial · 3.0mm · 0.88mm/px · z∈[-113,+45]mm · 8 of 108 slices shown (1 of 4)]
[im 1/108]
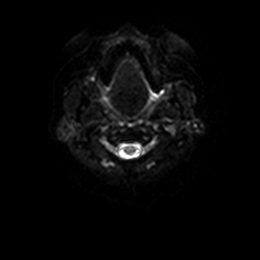
[im 16/108]
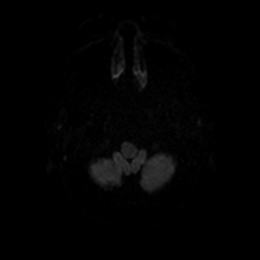
[im 31/108]
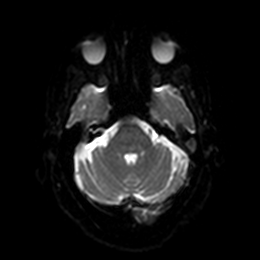
[im 46/108]
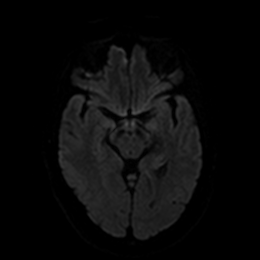
[im 62/108]
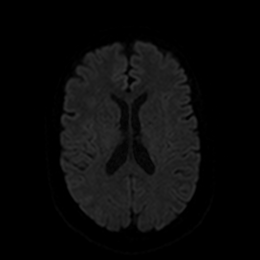
[im 77/108]
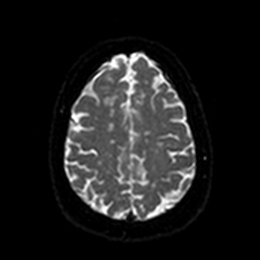
[im 92/108]
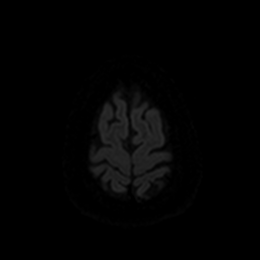
[im 108/108]
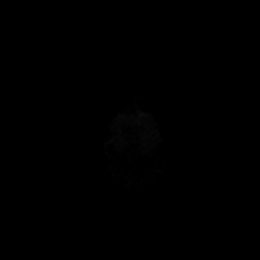

[Series 6: DWI · axial · 3.0mm · 0.88mm/px · z∈[-113,+45]mm · 4 of 54 slices shown (2 of 4)]
[im 1/54]
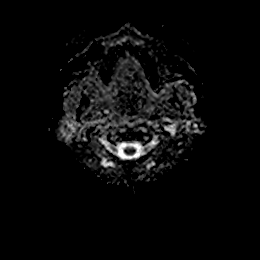
[im 18/54]
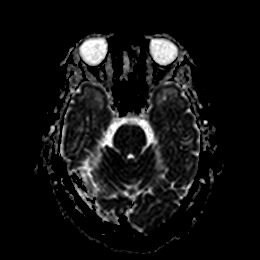
[im 36/54]
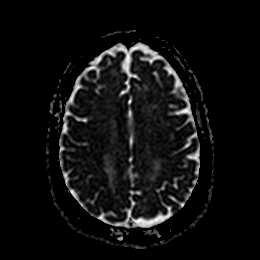
[im 54/54]
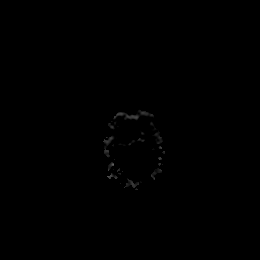

[Series 7: DWI · coronal · 4.0mm · 0.88mm/px · 5 of 76 slices shown (3 of 4)]
[im 1/76]
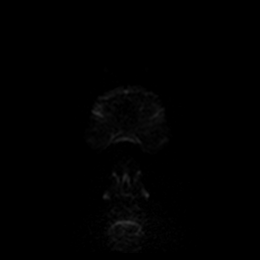
[im 19/76]
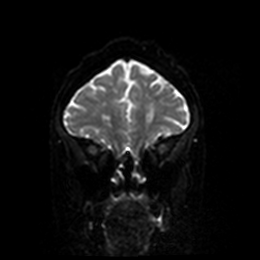
[im 38/76]
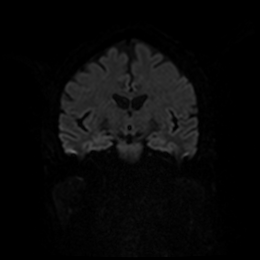
[im 57/76]
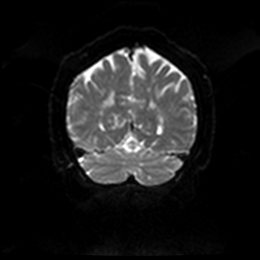
[im 76/76]
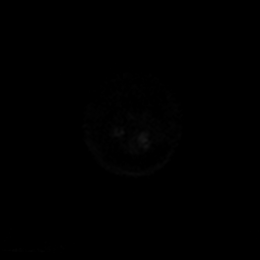

[Series 8: DWI · coronal · 4.0mm · 0.88mm/px · 3 of 38 slices shown (4 of 4)]
[im 1/38]
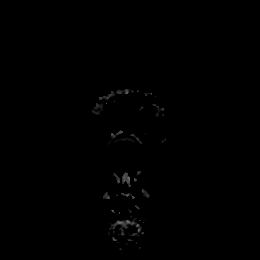
[im 19/38]
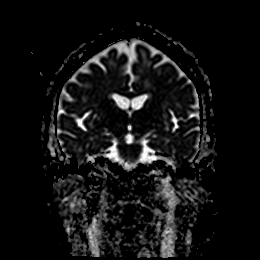
[im 38/38]
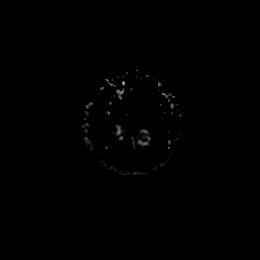

[Series 9: T1 · sagittal · 5.0mm · 0.75mm/px · 2 of 23 slices shown]
[im 1/23]
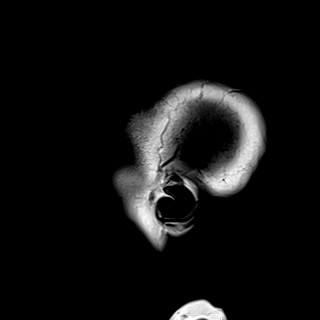
[im 23/23]
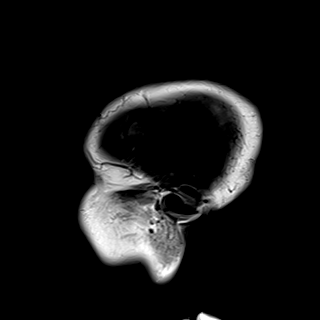

[Series 10: T2 · axial · 5.0mm · 0.72mm/px · z∈[-113,+30]mm · 2 of 25 slices shown (1 of 2)]
[im 1/25]
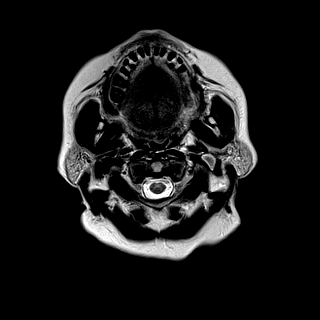
[im 25/25]
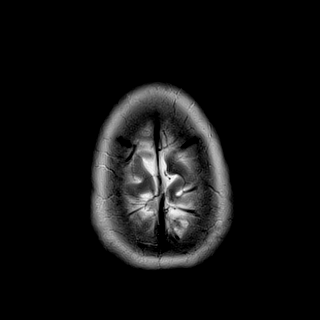

[Series 11: FLAIR · axial · 5.0mm · 0.45mm/px · z∈[-114,+29]mm · 2 of 25 slices shown]
[im 1/25]
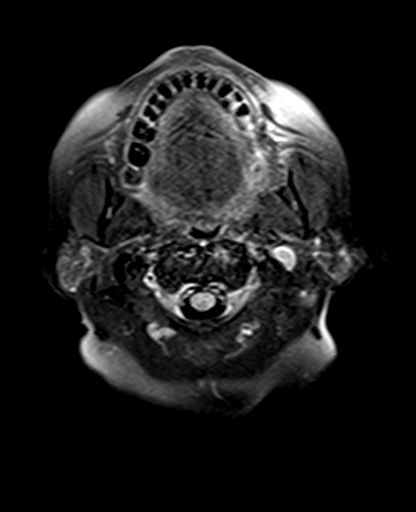
[im 25/25]
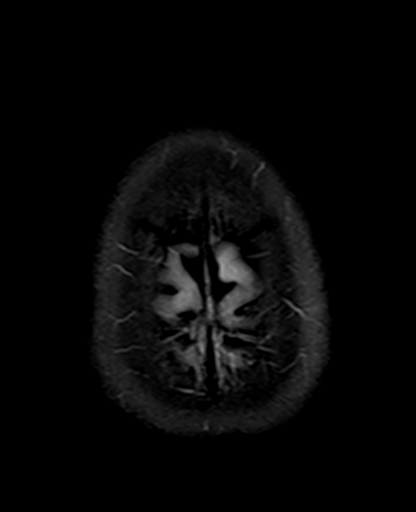

[Series 12: mag_images · axial · 3.0mm · 0.90mm/px · z∈[-119,+57]mm · 4 of 60 slices shown]
[im 1/60]
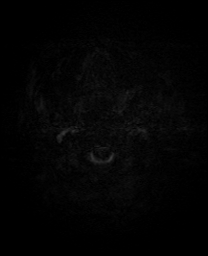
[im 20/60]
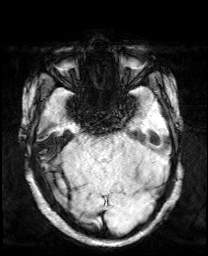
[im 40/60]
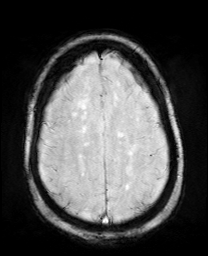
[im 60/60]
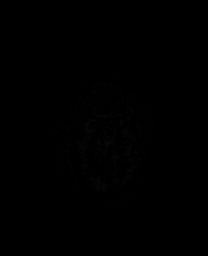

[Series 13: pha_images · axial · 3.0mm · 0.90mm/px · z∈[-119,+57]mm · 4 of 60 slices shown]
[im 1/60]
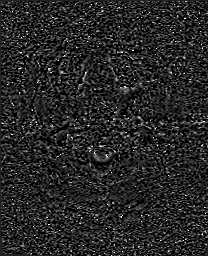
[im 20/60]
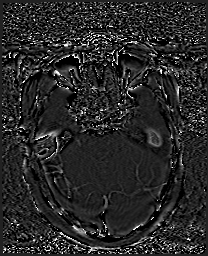
[im 40/60]
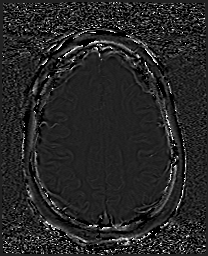
[im 60/60]
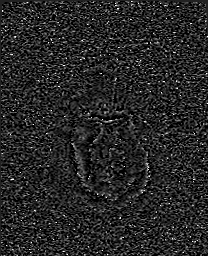

[Series 14: swi_images · axial · 3.0mm · 0.90mm/px · z∈[-119,+57]mm · 4 of 60 slices shown]
[im 1/60]
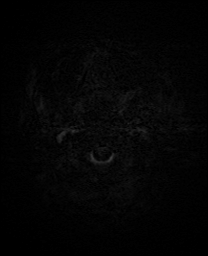
[im 20/60]
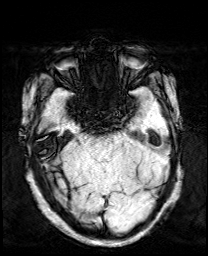
[im 40/60]
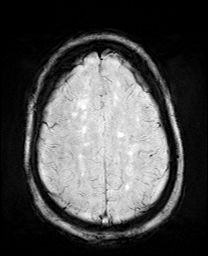
[im 60/60]
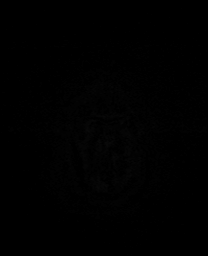

[Series 15: mip_images(sw) · axial · 24.0mm · 0.90mm/px · z∈[-108,+47]mm · 4 of 53 slices shown]
[im 1/53]
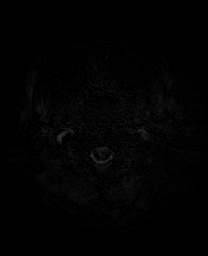
[im 18/53]
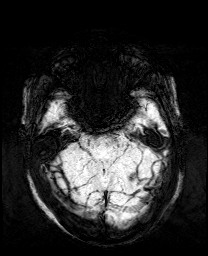
[im 35/53]
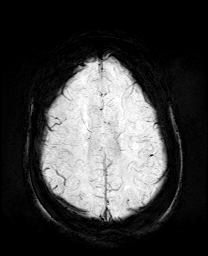
[im 53/53]
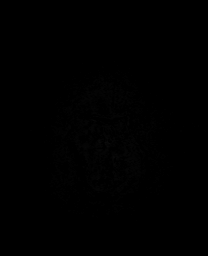

[Series 17: T2 · coronal · 5.0mm · 0.34mm/px · 2 of 29 slices shown (2 of 2)]
[im 1/29]
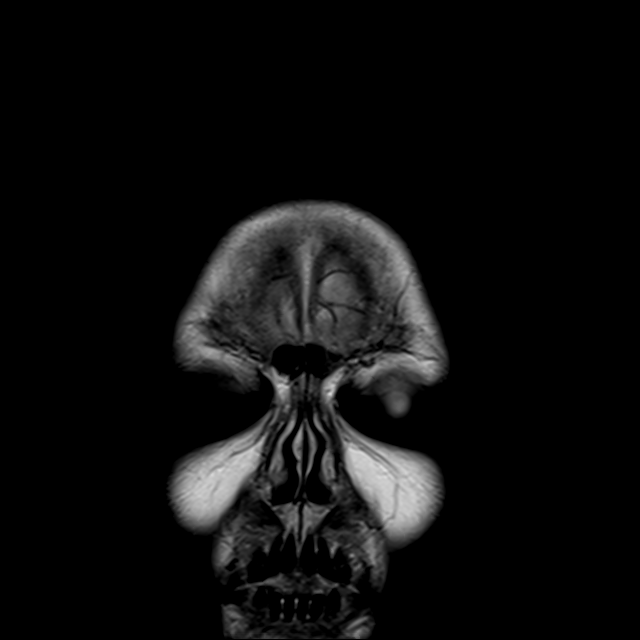
[im 29/29]
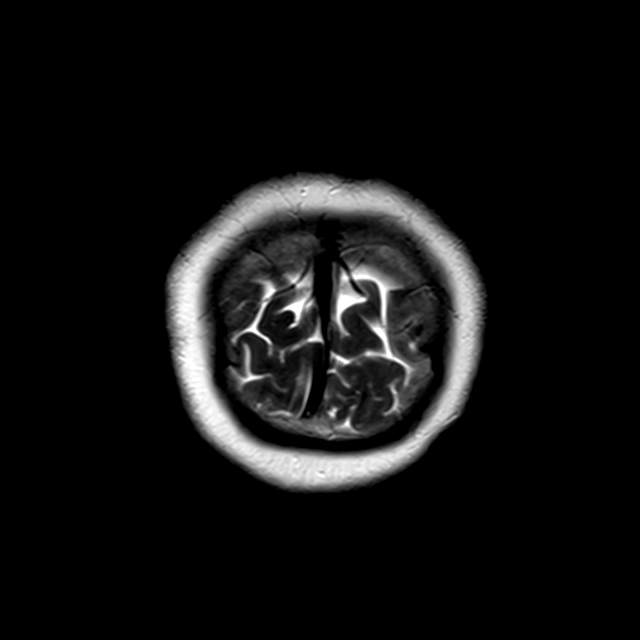

[44 of 48 positions shown; findings below may reference images not displayed]

FINDINGS: Brain: No restricted diffusion to suggest acute infarction. No
midline shift, mass effect, evidence of mass lesion,
ventriculomegaly, extra-axial collection or acute intracranial
hemorrhage. Cervicomedullary junction and pituitary are within
normal limits.

Widely scattered and patchy bilateral cerebral white matter T2 and
FLAIR hyperintensity, with both periventricular and subcortical
involvement. There are chronic microhemorrhages in the left parietal
lobe (series 14, image 36) and left occipital lobe (image 25). No
cerebral cortical encephalomalacia is identified. There is a small
area of chronic encephalomalacia in the left cerebellum seen on
series 10, image 8 and series 16, image 16. Chronic lacunar infarct
of the right caudate (series 16, image 33). But otherwise mild T2
heterogeneity in the deep gray matter nuclei. Brainstem is within
normal limits.

Vascular: Major intracranial vascular flow voids are preserved,
although the vertebrobasilar system appears to be diminutive on the
basis of fetal type PCA origins (normal variant series 10, image
11).

Skull and upper cervical spine: Visible cervical spine is normal for
age. Visualized bone marrow signal is within normal limits.

Sinuses/Orbits: Negative.

Other: Mastoids are clear. Grossly normal visible internal auditory
structures.

Visible scalp and face soft tissues appear symmetric and
unremarkable.
IMPRESSION: 1. No acute intracranial abnormality.
2. Moderately advanced signal changes most compatible with chronic
small vessel disease, including small chronic infarcts in the right
caudate and left cerebellum, occasional chronic microhemorrhages in
the left cerebral hemisphere.

## 2021-08-09 ENCOUNTER — Other Ambulatory Visit: Payer: Self-pay | Admitting: Family Medicine

## 2021-08-09 DIAGNOSIS — N3941 Urge incontinence: Secondary | ICD-10-CM

## 2021-08-18 ENCOUNTER — Other Ambulatory Visit: Payer: Self-pay | Admitting: Internal Medicine

## 2021-08-18 DIAGNOSIS — I10 Essential (primary) hypertension: Secondary | ICD-10-CM

## 2021-08-18 DIAGNOSIS — E119 Type 2 diabetes mellitus without complications: Secondary | ICD-10-CM

## 2021-08-18 DIAGNOSIS — I491 Atrial premature depolarization: Secondary | ICD-10-CM

## 2021-08-24 NOTE — Progress Notes (Unsigned)
° ° °  SUBJECTIVE:   CHIEF COMPLAINT / HPI:   DM2: Last A1c was 5.2% in February 2022, will repeat today. DM is diet controlled. She is on a statin.  HTN: BP is ***at goal today. Current meds are amlodipine 10 mg, coreg 3.125 mg BID.   HLD: last lipid panel in 2021, will repeat today.  Osteoporosis: patient took prolia in may 2022. Will recheck Ca. Last Dexa scan in 04/2020 showed -3.1 T score. Will re-order dexa.  PERTINENT  PMH / PSH: h/o diet controlled DM, hypertension, osteoporosis  OBJECTIVE:   There were no vitals taken for this visit.  ***  ASSESSMENT/PLAN:   No problem-specific Assessment & Plan notes found for this encounter.     Shirlean Mylar, MD [facility]

## 2021-08-25 ENCOUNTER — Encounter: Payer: Self-pay | Admitting: Family Medicine

## 2021-08-25 ENCOUNTER — Ambulatory Visit (INDEPENDENT_AMBULATORY_CARE_PROVIDER_SITE_OTHER): Payer: Medicare Other | Admitting: Family Medicine

## 2021-08-25 ENCOUNTER — Other Ambulatory Visit: Payer: Self-pay

## 2021-08-25 ENCOUNTER — Other Ambulatory Visit (HOSPITAL_COMMUNITY)
Admission: RE | Admit: 2021-08-25 | Discharge: 2021-08-25 | Disposition: A | Discharge status: Home / Self Care | Payer: Medicare Other | Source: Ambulatory Visit | Attending: Family Medicine | Admitting: Family Medicine

## 2021-08-25 VITALS — HR 72 | Wt 198.8 lb

## 2021-08-25 DIAGNOSIS — Z1151 Encounter for screening for human papillomavirus (HPV): Secondary | ICD-10-CM | POA: Diagnosis not present

## 2021-08-25 DIAGNOSIS — I10 Essential (primary) hypertension: Secondary | ICD-10-CM | POA: Diagnosis not present

## 2021-08-25 DIAGNOSIS — Z113 Encounter for screening for infections with a predominantly sexual mode of transmission: Secondary | ICD-10-CM | POA: Insufficient documentation

## 2021-08-25 DIAGNOSIS — N3941 Urge incontinence: Secondary | ICD-10-CM

## 2021-08-25 DIAGNOSIS — E785 Hyperlipidemia, unspecified: Secondary | ICD-10-CM

## 2021-08-25 DIAGNOSIS — R0602 Shortness of breath: Secondary | ICD-10-CM | POA: Diagnosis not present

## 2021-08-25 DIAGNOSIS — E1169 Type 2 diabetes mellitus with other specified complication: Secondary | ICD-10-CM

## 2021-08-25 DIAGNOSIS — N898 Other specified noninflammatory disorders of vagina: Secondary | ICD-10-CM | POA: Insufficient documentation

## 2021-08-25 DIAGNOSIS — E119 Type 2 diabetes mellitus without complications: Secondary | ICD-10-CM | POA: Diagnosis present

## 2021-08-25 DIAGNOSIS — E0865 Diabetes mellitus due to underlying condition with hyperglycemia: Secondary | ICD-10-CM

## 2021-08-25 DIAGNOSIS — M810 Age-related osteoporosis without current pathological fracture: Secondary | ICD-10-CM

## 2021-08-25 DIAGNOSIS — Z124 Encounter for screening for malignant neoplasm of cervix: Secondary | ICD-10-CM | POA: Diagnosis present

## 2021-08-25 DIAGNOSIS — Z13220 Encounter for screening for lipoid disorders: Secondary | ICD-10-CM

## 2021-08-25 DIAGNOSIS — Z780 Asymptomatic menopausal state: Secondary | ICD-10-CM

## 2021-08-25 DIAGNOSIS — R0609 Other forms of dyspnea: Secondary | ICD-10-CM | POA: Diagnosis not present

## 2021-08-25 DIAGNOSIS — Z13820 Encounter for screening for osteoporosis: Secondary | ICD-10-CM | POA: Diagnosis not present

## 2021-08-25 LAB — POCT WET PREP (WET MOUNT)
Clue Cells Wet Prep Whiff POC: NEGATIVE
Trichomonas Wet Prep HPF POC: ABSENT

## 2021-08-25 LAB — POCT GLYCOSYLATED HEMOGLOBIN (HGB A1C): HbA1c, POC (controlled diabetic range): 5.9 % (ref 0.0–7.0)

## 2021-08-25 NOTE — Patient Instructions (Signed)
It was a pleasure to see you today! ? ?We will get some labs today.  If they are abnormal or we need to do something about them, I will call you.  If they are normal, I will send you a message on MyChart (if it is active) or a letter in the mail.  If you don't hear from us in 2 weeks, please call the office  [phone]. ?We will get you scheduled for an ultrasound of your heart for your shortness of breath ?Follow up will be based on the lab results ?[facility] ? ? ? ?Be Well, ? ?Dr. Leary RocaMahoney ? ?

## 2021-08-26 LAB — CBC WITH DIFFERENTIAL/PLATELET
Basophils Absolute: 0 10*3/uL (ref 0.0–0.2)
Basos: 0 %
EOS (ABSOLUTE): 0.1 10*3/uL (ref 0.0–0.4)
Eos: 1 %
Hematocrit: 45.8 % (ref 34.0–46.6)
Hemoglobin: 15.7 g/dL (ref 11.1–15.9)
Immature Grans (Abs): 0 10*3/uL (ref 0.0–0.1)
Immature Granulocytes: 0 %
Lymphocytes Absolute: 3.2 10*3/uL — ABNORMAL HIGH (ref 0.7–3.1)
Lymphs: 48 %
MCH: 31.1 pg (ref 26.6–33.0)
MCHC: 34.3 g/dL (ref 31.5–35.7)
MCV: 91 fL (ref 79–97)
Monocytes Absolute: 0.7 10*3/uL (ref 0.1–0.9)
Monocytes: 10 %
Neutrophils Absolute: 2.8 10*3/uL (ref 1.4–7.0)
Neutrophils: 41 %
Platelets: 192 10*3/uL (ref 150–450)
RBC: 5.05 x10E6/uL (ref 3.77–5.28)
RDW: 12.9 % (ref 11.7–15.4)
WBC: 6.8 10*3/uL (ref 3.4–10.8)

## 2021-08-26 LAB — TSH: TSH: 2.28 u[IU]/mL (ref 0.450–4.500)

## 2021-08-26 LAB — BRAIN NATRIURETIC PEPTIDE: BNP: 144.3 pg/mL — ABNORMAL HIGH (ref 0.0–100.0)

## 2021-08-27 LAB — COMPREHENSIVE METABOLIC PANEL
ALT: 26 IU/L (ref 0–32)
AST: 21 IU/L (ref 0–40)
Albumin/Globulin Ratio: 1.6 (ref 1.2–2.2)
Albumin: 4.7 g/dL (ref 3.7–4.7)
Alkaline Phosphatase: 85 IU/L (ref 44–121)
BUN/Creatinine Ratio: 19 (ref 12–28)
BUN: 12 mg/dL (ref 8–27)
Bilirubin Total: 0.4 mg/dL (ref 0.0–1.2)
CO2: 24 mmol/L (ref 20–29)
Calcium: 9.9 mg/dL (ref 8.7–10.3)
Chloride: 101 mmol/L (ref 96–106)
Creatinine, Ser: 0.62 mg/dL (ref 0.57–1.00)
Globulin, Total: 2.9 g/dL (ref 1.5–4.5)
Glucose: 121 mg/dL — ABNORMAL HIGH (ref 70–99)
Potassium: 4.5 mmol/L (ref 3.5–5.2)
Sodium: 140 mmol/L (ref 134–144)
Total Protein: 7.6 g/dL (ref 6.0–8.5)
eGFR: 94 mL/min/{1.73_m2} (ref >59)

## 2021-08-27 LAB — LIPID PANEL
Chol/HDL Ratio: 2 ratio (ref 0.0–4.4)
Cholesterol, Total: 113 mg/dL (ref 100–199)
HDL: 57 mg/dL (ref >39)
LDL Chol Calc (NIH): 41 mg/dL (ref 0–99)
Triglycerides: 72 mg/dL (ref 0–149)
VLDL Cholesterol Cal: 15 mg/dL (ref 5–40)

## 2021-08-27 LAB — MICROALBUMIN / CREATININE URINE RATIO
Creatinine, Urine: 99.1 mg/dL
Microalb/Creat Ratio: 44 mg/g creat — ABNORMAL HIGH (ref 0–29)
Microalbumin, Urine: 43.6 ug/mL

## 2021-08-28 LAB — CYTOLOGY - PAP
Comment: NEGATIVE
Diagnosis: NEGATIVE
High risk HPV: NEGATIVE

## 2021-08-28 LAB — CERVICOVAGINAL ANCILLARY ONLY
Chlamydia: NEGATIVE
Comment: NEGATIVE
Comment: NORMAL
Neisseria Gonorrhea: NEGATIVE

## 2021-09-03 DIAGNOSIS — E1169 Type 2 diabetes mellitus with other specified complication: Secondary | ICD-10-CM | POA: Insufficient documentation

## 2021-09-03 DIAGNOSIS — R0600 Dyspnea, unspecified: Secondary | ICD-10-CM

## 2021-09-03 DIAGNOSIS — R0602 Shortness of breath: Secondary | ICD-10-CM | POA: Insufficient documentation

## 2021-09-03 DIAGNOSIS — N898 Other specified noninflammatory disorders of vagina: Secondary | ICD-10-CM | POA: Insufficient documentation

## 2021-09-03 HISTORY — DX: Dyspnea, unspecified: R06.00

## 2021-09-03 NOTE — Assessment & Plan Note (Signed)
Patient is postmenopausal, could be atrophic vaginitis. Wet prep, GC, RPR, HIV all negative. Obtained pap given polyp and friable cervix, though she is outside of screening age, reassuringly NILM, HPV neg. Will discuss with preceptor best next step as she is outside the age range for safe usage of HRT, could consider topical steroid vs GYN referral. ?

## 2021-09-03 NOTE — Assessment & Plan Note (Signed)
Chronic, controlled. No change to current regimen. ?

## 2021-09-03 NOTE — Assessment & Plan Note (Signed)
Chronic, controlled with current statin therapy. LDL below goal <70, currently 45. HDL appropriate at 57. ?

## 2021-09-03 NOTE — Assessment & Plan Note (Signed)
With DOE, concern for cardiac cause exists given demographics. Could be deconditioning from weight gain, could be new HFrEF. Will obtain an echo and have patient follow up with cardiologist. Previously established with Dr. Bernerd Phohandreshekar. BNP, exam (JVP), lend slightly more to CHF. Will consider pulmonary causes if echo normal. ?

## 2021-09-03 NOTE — Assessment & Plan Note (Signed)
A1c still appropriate at 5.9%, last year 5.2%. DM is diet controlled, no change to medications at this time. ?

## 2021-09-03 NOTE — Assessment & Plan Note (Signed)
Last Dexa in 2021, will repeat Dexa. S/p 1 dose denosumab. Ca appropriate at this visit 9.9.  ?

## 2021-09-04 NOTE — Progress Notes (Signed)
[facility] appt made for Wed. March 23rd at 3:00. [facility]. Patient was informed. Salvatore Marvel, CMA ? ?

## 2021-09-06 ENCOUNTER — Ambulatory Visit (HOSPITAL_COMMUNITY)
Admission: RE | Admit: 2021-09-06 | Discharge: 2021-09-06 | Disposition: A | Discharge status: Home / Self Care | Payer: Medicare Other | Source: Ambulatory Visit | Attending: Family Medicine | Admitting: Family Medicine

## 2021-09-06 ENCOUNTER — Other Ambulatory Visit: Payer: Self-pay

## 2021-09-06 DIAGNOSIS — R0602 Shortness of breath: Secondary | ICD-10-CM | POA: Insufficient documentation

## 2021-09-06 DIAGNOSIS — E119 Type 2 diabetes mellitus without complications: Secondary | ICD-10-CM | POA: Diagnosis not present

## 2021-09-06 DIAGNOSIS — I080 Rheumatic disorders of both mitral and aortic valves: Secondary | ICD-10-CM | POA: Diagnosis not present

## 2021-09-06 DIAGNOSIS — R0600 Dyspnea, unspecified: Secondary | ICD-10-CM | POA: Insufficient documentation

## 2021-09-06 LAB — ECHOCARDIOGRAM COMPLETE
Area-P 1/2: 4.06 cm2
Calc EF: 65.9 %
P 1/2 time: 534 msec
S' Lateral: 2.9 cm
Single Plane A2C EF: 66.9 %
Single Plane A4C EF: 62.6 %

## 2021-09-08 ENCOUNTER — Other Ambulatory Visit: Payer: Medicare Other

## 2021-09-08 ENCOUNTER — Other Ambulatory Visit: Payer: Self-pay

## 2021-09-08 DIAGNOSIS — K7460 Unspecified cirrhosis of liver: Secondary | ICD-10-CM

## 2021-09-08 DIAGNOSIS — Z8619 Personal history of other infectious and parasitic diseases: Secondary | ICD-10-CM

## 2021-09-09 ENCOUNTER — Other Ambulatory Visit: Payer: Self-pay | Admitting: Internal Medicine

## 2021-09-09 DIAGNOSIS — I491 Atrial premature depolarization: Secondary | ICD-10-CM

## 2021-09-09 DIAGNOSIS — E119 Type 2 diabetes mellitus without complications: Secondary | ICD-10-CM

## 2021-09-11 LAB — HEPATIC FUNCTION PANEL
AG Ratio: 1.6 (calc) (ref 1.0–2.5)
ALT: 25 U/L (ref 6–29)
AST: 21 U/L (ref 10–35)
Albumin: 4.5 g/dL (ref 3.6–5.1)
Alkaline phosphatase (APISO): 69 U/L (ref 37–153)
Bilirubin, Direct: 0.1 mg/dL (ref 0.0–0.2)
Globulin: 2.9 g/dL (calc) (ref 1.9–3.7)
Indirect Bilirubin: 0.3 mg/dL (calc) (ref 0.2–1.2)
Total Bilirubin: 0.4 mg/dL (ref 0.2–1.2)
Total Protein: 7.4 g/dL (ref 6.1–8.1)

## 2021-09-11 LAB — AFP TUMOR MARKER: AFP-Tumor Marker: 4.5 ng/mL

## 2021-09-17 ENCOUNTER — Other Ambulatory Visit: Payer: Self-pay | Admitting: Internal Medicine

## 2021-09-17 ENCOUNTER — Other Ambulatory Visit: Payer: Self-pay | Admitting: Family Medicine

## 2021-09-17 DIAGNOSIS — E119 Type 2 diabetes mellitus without complications: Secondary | ICD-10-CM

## 2021-09-17 DIAGNOSIS — I10 Essential (primary) hypertension: Secondary | ICD-10-CM

## 2021-09-17 DIAGNOSIS — E1169 Type 2 diabetes mellitus with other specified complication: Secondary | ICD-10-CM

## 2021-09-17 DIAGNOSIS — J302 Other seasonal allergic rhinitis: Secondary | ICD-10-CM

## 2021-09-17 DIAGNOSIS — I491 Atrial premature depolarization: Secondary | ICD-10-CM

## 2021-09-18 ENCOUNTER — Encounter: Payer: Self-pay | Admitting: Infectious Diseases

## 2021-09-18 ENCOUNTER — Ambulatory Visit (INDEPENDENT_AMBULATORY_CARE_PROVIDER_SITE_OTHER): Payer: Medicare Other | Admitting: Infectious Diseases

## 2021-09-18 ENCOUNTER — Other Ambulatory Visit: Payer: Self-pay

## 2021-09-18 VITALS — BP 155/99 | HR 74 | Resp 16 | Ht 63.0 in | Wt 199.2 lb

## 2021-09-18 DIAGNOSIS — Z8619 Personal history of other infectious and parasitic diseases: Secondary | ICD-10-CM | POA: Diagnosis present

## 2021-09-18 DIAGNOSIS — K7460 Unspecified cirrhosis of liver: Secondary | ICD-10-CM | POA: Diagnosis not present

## 2021-09-18 MED ORDER — CARVEDILOL 3.125 MG PO TABS: 3.1250 mg | ORAL_TABLET | Freq: Two times a day (BID) | ORAL | 0 refills | Status: DC

## 2021-09-18 NOTE — Assessment & Plan Note (Addendum)
Cured and no risk for re-infection. Still at higher risk for hepatocellular carcinoma given [facility].  ?Will order one more ultrasound for her today to pair with recently negative AFP blood work and transition screenings to her primary care team.  ?No further follow up with ID required.  ?

## 2021-09-18 NOTE — Assessment & Plan Note (Addendum)
[facility] / concern for cirrhosis d/t HCV. Agree with reducing weight to prevent any extra influence on liver inflammation r/t NAFLD. No alcohol preferred. Tylenol OK up to 1 gm daily. No bleeding risk with normal platelet count (last 192K) and INR (1.2).  ?

## 2021-09-18 NOTE — Progress Notes (Signed)
? ?  ?Patient: Tracey Riddle  ?DOB: 08/24/1950 ?MRN: [id number] ?PCP: Gladys Damme, MD  ? ? ?Subjective:  ?Blessing Zaucha is a 71 y.o. female here for follow up on strongyloidiasis gastritis and chronic Hepatitis C, Genotype 4, F4.  ? ? ?HPI: ?They declined in person interpretor and prefer her niece to interpret.  ?Here for follow up s/p hepatitis c treatment & cure, compensated cirrhosis history.  ? ?Last liver ultrasound September 2022 >> no changes to contours of liver. No mass effect. AFP normal ? ?Unhappy with the weight on the scale. She and her niece want to make some changes to help with reducing weight.  ? ? ? ?Review of Systems  ?Constitutional:  Positive for unexpected weight change (weight gain). Negative for appetite change, fatigue and fever.  ?Respiratory:  Negative for shortness of breath.   ?Cardiovascular:  Negative for chest pain and leg swelling.  ?Gastrointestinal:  Negative for abdominal pain, blood in stool, nausea and vomiting.  ?Genitourinary:  Negative for difficulty urinating and hematuria.  ?Musculoskeletal:  Negative for arthralgias.  ?Skin:  Negative for color change.  ?Neurological:  Negative for dizziness, tremors and headaches.  ?Hematological:  Negative for adenopathy.  ?Psychiatric/Behavioral:  Negative for confusion.   ? ? ?Past Medical History:  ?Diagnosis Date  ? Abnormal liver CT 08/27/2019  ? CT AP (08/26/19, [facility]): Mildly nodular hepatic contour c/w possible mild cirrhosis.  ? Diabetes mellitus type II, controlled ([place]) 08/27/2019  ? Hepatitis C   ? ? ?Outpatient Medications Prior to Visit  ?Medication Sig Dispense Refill  ? acetaminophen (TYLENOL) 500 MG tablet Take 500 mg by mouth every 8 (eight) hours as needed for mild pain or moderate pain.     ? amLODipine (NORVASC) 2.5 MG tablet Take 1 tablet (2.5 mg total) by mouth at bedtime. 90 tablet 3  ? atorvastatin (LIPITOR) 20 MG tablet TAKE 1 TABLET BY MOUTH DAILY AT 6 PM. 90 tablet 2  ? atorvastatin (LIPITOR) 40 MG  tablet Take 1 tablet by mouth daily.    ? Blood Glucose Monitoring Suppl (ACCU-CHEK GUIDE) w/Device KIT 1 each by Does not apply route 4 (four) times daily -  before meals and at bedtime. 1 kit 3  ? Blood Glucose Monitoring Suppl (ONETOUCH VERIO) w/Device KIT Please use to check blood sugar three times daily. E11.9 1 kit 0  ? Calcium Carb-Cholecalciferol (OYSTER SHELL CALCIUM W/D) 500-5 MG-MCG TABS TAKE 1 TABLET BY MOUTH EVERY DAY WITH BREAKFAST 90 tablet 3  ? calcium-vitamin D (OSCAL WITH D) 500-200 MG-UNIT tablet Take 1 tablet by mouth daily with breakfast. 30 tablet 11  ? carvedilol (COREG) 3.125 MG tablet Take 1 tablet (3.125 mg total) by mouth 2 (two) times daily. Please make overdue appt with Dr. Gasper Sells before anymore refills. Thank you 2nd attempt 30 tablet 0  ? diclofenac Sodium (VOLTAREN) 1 % GEL Apply 4 g topically 4 (four) times daily. 100 g 3  ? fesoterodine (TOVIAZ) 8 MG TB24 tablet TAKE 1 TABLET BY MOUTH EVERY DAY 30 tablet 3  ? fexofenadine (ALLEGRA ALLERGY) 60 MG tablet Take 1 tablet (60 mg total) by mouth as needed. 30 tablet 6  ? fluticasone (FLONASE) 50 MCG/ACT nasal spray SPRAY 2 SPRAYS INTO EACH NOSTRIL EVERY DAY 48 mL 2  ? glucose blood (ACCU-CHEK GUIDE) test strip Check twice a day. Not on Insulin. 100 strip 5  ? ibuprofen (ADVIL) 800 MG tablet Take 1 tablet (800 mg total) by mouth every 8 (eight) hours as  needed. 30 tablet 0  ? Lancet Devices (ONE TOUCH DELICA LANCING DEV) MISC Please use to check blood sugar three times daily. E11.9 1 each 0  ? ondansetron (ZOFRAN-ODT) 4 MG disintegrating tablet Take 4 mg by mouth 3 (three) times daily as needed.    ? OneTouch Delica Lancets 53Z MISC Please use to check blood sugar three times daily. E11.9 100 each 0  ? PROLIA 60 MG/ML SOSY injection TO BE ADMINISTERED IN PHYSICIAN'S OFFICE. INJECT ONE SYRINGE SUBCOUSLY ONCE EVERY 6 MONTHS. REFRIGERATE. USE WITHIN 14 DAYS ONCE AT ROOM TEMPERATURE. 1 mL 0  ? sennosides-docusate sodium (SENOKOT-S)  8.6-50 MG tablet Take 1 tablet by mouth daily. 30 tablet 3  ? traMADol (ULTRAM) 50 MG tablet Take 1 tablet (50 mg total) by mouth every 8 (eight) hours as needed. 15 tablet 2  ? ?Facility-Administered Medications Prior to Visit  ?Medication Dose Route Frequency Provider Last Rate Last Admin  ? methylPREDNISolone acetate (DEPO-MEDROL) injection 40 mg  40 mg Intramuscular Once Gladys Damme, MD      ?  ? ?No Known Allergies ? ?Social History  ? ?Tobacco Use  ? Smoking status: Never  ? Smokeless tobacco: Never  ?Substance Use Topics  ? Drug use: Never  ? ? ?No family history on file. ? ?Objective:  ? ?Vitals:  ? 09/18/21 0855  ?BP: (!) 155/99  ?Pulse: 74  ?Resp: 16  ?SpO2: 96%  ?Weight: 199 lb 3.2 oz (90.4 kg)  ?Height: 5' 3"  (1.6 m)  ? ?Body mass index is 35.29 kg/m?. ? ?Physical Exam ?Vitals reviewed.  ?Constitutional:   ?   Appearance: Normal appearance. She is not ill-appearing.  ?HENT:  ?   Mouth/Throat:  ?   Mouth: Mucous membranes are moist.  ?   Pharynx: Oropharynx is clear.  ?Eyes:  ?   General: No scleral icterus. ?Cardiovascular:  ?   Rate and Rhythm: Normal rate and regular rhythm.  ?Pulmonary:  ?   Effort: Pulmonary effort is normal.  ?Neurological:  ?   Mental Status: She is oriented to person, place, and time.  ?Psychiatric:     ?   Mood and Affect: Mood normal.     ?   Thought Content: Thought content normal.  ?  ? ? ?Lab Results: ?Lab Results  ?Component Value Date  ? WBC 6.8 08/25/2021  ? HGB 15.7 08/25/2021  ? HCT 45.8 08/25/2021  ? MCV 91 08/25/2021  ? PLT 192 08/25/2021  ?  ?Lab Results  ?Component Value Date  ? CREATININE 0.62 08/25/2021  ? BUN 12 08/25/2021  ? NA 140 08/25/2021  ? K 4.5 08/25/2021  ? CL 101 08/25/2021  ? CO2 24 08/25/2021  ?  ?Lab Results  ?Component Value Date  ? ALT 25 09/08/2021  ? AST 21 09/08/2021  ? GGT 195 (H) 01/19/2020  ? ALKPHOS 85 08/25/2021  ? BILITOT 0.4 09/08/2021  ?  ? ?Assessment & Plan:  ? ?Problem List Items Addressed This Visit   ? ?  ? Medium   ? Hepatitis  C virus infection cured after antiviral drug therapy - Primary  ?  Cured and no risk for re-infection. Still at higher risk for hepatocellular carcinoma given progression to cirrhosis due to [facility].  ?Will order one more ultrasound for her today to pair with recently negative AFP blood work and transition screenings to her primary care team.  ?No further follow up with ID required.  ?  ?  ?  ? Unprioritized  ?  Cirrhosis of liver without ascites ([place])  ?  U/S for [facility] screening today for compensated liver disease / concern for cirrhosis d/t HCV. Agree with reducing weight to prevent any extra influence on liver inflammation r/t NAFLD. No alcohol preferred. Tylenol OK up to 1 gm daily. No bleeding risk with normal platelet count (last 192K) and INR (1.2).  ?  ?  ? Relevant Orders  ? US ABDOMEN LIMITED RUQ (LIVER/GB)  ? ?Janene Madeira, MSN, NP-C ?[place] [facility] ?[facility] ?Pager: [phone] ?Office: [phone] ? ?

## 2021-09-18 NOTE — Telephone Encounter (Signed)
Called pt to advise that carvedilol 3.125 mg PO BID will only be refilled by our office if overdue OV is scheduled.  Or pt can f/u with PCP.  Pt asked to schedule with Dr. Gasper Sells for 01/17/22 at 4 pm.  Will send in enough refills to last until OV.  Pt had no further questions or concerns.  ?

## 2021-09-18 NOTE — Patient Instructions (Addendum)
Nice to see you!  ? ?Enjoy the walks outside! It's getting beautiful! ? ?I think you are doing well and you can just follow up liver screenings with your regular Family Care Medicine Team.  ? ?I will send DR. Mahoney follow up recommendations for you so you can check with them next September/October for your next liver check.  ? ?I will order one more ultrasound for you today - please meet with Joycelyn Schmid to see if we can  ?

## 2021-09-25 ENCOUNTER — Ambulatory Visit (HOSPITAL_COMMUNITY): Admission: RE | Admit: 2021-09-25 | Payer: Medicare Other | Source: Ambulatory Visit

## 2021-10-13 ENCOUNTER — Ambulatory Visit (HOSPITAL_COMMUNITY)
Admission: RE | Admit: 2021-10-13 | Discharge: 2021-10-13 | Disposition: A | Discharge status: Home / Self Care | Payer: Medicare Other | Source: Ambulatory Visit | Attending: Infectious Diseases | Admitting: Infectious Diseases

## 2021-10-13 DIAGNOSIS — K7460 Unspecified cirrhosis of liver: Secondary | ICD-10-CM | POA: Diagnosis present

## 2021-11-03 NOTE — Telephone Encounter (Signed)
Sent to pcp for request 

## 2021-11-26 ENCOUNTER — Other Ambulatory Visit: Payer: Self-pay | Admitting: Family Medicine

## 2021-11-26 DIAGNOSIS — N3941 Urge incontinence: Secondary | ICD-10-CM

## 2021-11-30 ENCOUNTER — Telehealth: Payer: Self-pay | Admitting: Family Medicine

## 2021-11-30 ENCOUNTER — Other Ambulatory Visit: Payer: Self-pay | Admitting: Family Medicine

## 2021-11-30 NOTE — Telephone Encounter (Signed)
Patient stopped by she's out of her medicine Amlodipine 2.5 mg.  Her pharmacy is [facility] on [address] in [place].  She needs refill.  Any questions please call patient.  [phone]

## 2021-12-15 ENCOUNTER — Other Ambulatory Visit: Payer: Self-pay | Admitting: Internal Medicine

## 2021-12-15 DIAGNOSIS — I491 Atrial premature depolarization: Secondary | ICD-10-CM

## 2021-12-15 DIAGNOSIS — E119 Type 2 diabetes mellitus without complications: Secondary | ICD-10-CM

## 2022-01-17 ENCOUNTER — Encounter: Payer: Self-pay | Admitting: Internal Medicine

## 2022-01-17 ENCOUNTER — Ambulatory Visit (INDEPENDENT_AMBULATORY_CARE_PROVIDER_SITE_OTHER): Payer: Medicare Other | Admitting: Internal Medicine

## 2022-01-17 VITALS — BP 120/80 | HR 65 | Ht 63.0 in | Wt 196.0 lb

## 2022-01-17 DIAGNOSIS — I10 Essential (primary) hypertension: Secondary | ICD-10-CM | POA: Diagnosis not present

## 2022-01-17 DIAGNOSIS — I491 Atrial premature depolarization: Secondary | ICD-10-CM | POA: Diagnosis not present

## 2022-01-17 DIAGNOSIS — I517 Cardiomegaly: Secondary | ICD-10-CM | POA: Diagnosis not present

## 2022-01-17 DIAGNOSIS — E119 Type 2 diabetes mellitus without complications: Secondary | ICD-10-CM

## 2022-01-17 NOTE — Patient Instructions (Signed)
Medication Instructions:  Your physician recommends that you continue on your current medications as directed. Please refer to the Current Medication list given to you today.  *If you need a refill on your cardiac medications before your next appointment, please call your pharmacy*   Lab Work: NONE If you have labs (blood work) drawn today and your tests are completely normal, you will receive your results only by: MyChart Message (if you have MyChart) OR A paper copy in the mail If you have any lab test that is abnormal or we need to change your treatment, we will call you to review the results.   Testing/Procedures: NONE   Follow-Up: At [facility], you and your health needs are our priority.  As part of our continuing mission to provide you with exceptional heart care, we have created designated Provider Care Teams.  These Care Teams include your primary Cardiologist (physician) and Advanced Practice Providers (APPs -  Physician Assistants and Nurse Practitioners) who all work together to provide you with the care you need, when you need it.  Your next appointment:   1 year(s)  The format for your next appointment:   In Person  Provider:   Mahesh A Chandrasekhar, MD    Important Information About Sugar       

## 2022-01-17 NOTE — Progress Notes (Signed)
Cardiology Office Note:    Date:  01/17/2022   ID:  Tracey Riddle, DOB 13-[name]-1951, MRN [id number]  PCP:  Salvadore Oxford, MD  [facility] Cardiologist: Rudean Haskell MD  [place] Electrophysiologist:  None   CC: PACs  Interpretor deferred for Daugther  History of Present Illness:    Tracey Riddle is a 71 y.o. female with a hx of T2DM with HTN, Hep C who presents for dizziness evaluation 05/06/20.  In interim of this visit, patient had Ziopatch with symptom triggered PACs. 2022: started on Coreg.  Lost to follow up.  Patient notes that she is doing great.   Goes walking in the neighborhood; went to [facility] to see her brother. There are no interval hospital/ED visit.    No chest pain or pressure .  No SOB/DOE and no PND/Orthopnea.  No weight gain or leg swelling.  No palpitations or syncope .  Ambulatory blood pressure not done.   Past Medical History:  Diagnosis Date   Abnormal liver CT 08/27/2019   CT AP (08/26/19, [facility]): Mildly nodular hepatic contour c/w possible mild cirrhosis.   Diabetes mellitus type II, controlled ([place]) 08/27/2019   Hepatitis C     Past Surgical History:  Procedure Laterality Date   BIOPSY  09/07/2019   Procedure: BIOPSY;  Surgeon: Wilford Corner, MD;  Location: [place];  Service: Endoscopy;;   ESOPHAGEAL BRUSHING  09/07/2019   Procedure: ESOPHAGEAL BRUSHING;  Surgeon: Wilford Corner, MD;  Location: [place];  Service: Endoscopy;;   ESOPHAGOGASTRODUODENOSCOPY (EGD) WITH PROPOFOL Left 09/07/2019   Procedure: ESOPHAGOGASTRODUODENOSCOPY (EGD) WITH PROPOFOL;  Surgeon: Wilford Corner, MD;  Location: [place];  Service: Endoscopy;  Laterality: Left;    Current Medications: Current Meds  Medication Sig   acetaminophen (TYLENOL) 500 MG tablet Take 2 tablets by mouth every 8 (eight) hours as needed for mild pain, moderate pain or headache.   amLODipine (NORVASC) 2.5 MG tablet TAKE 1 TABLET BY MOUTH AT BEDTIME.    atorvastatin (LIPITOR) 20 MG tablet TAKE 1 TABLET BY MOUTH DAILY AT 6 PM.   calcium-vitamin D (OSCAL WITH D) 500-200 MG-UNIT tablet Take 1 tablet by mouth daily with breakfast.   carvedilol (COREG) 3.125 MG tablet Take 1 tablet (3.125 mg total) by mouth 2 (two) times daily.   diclofenac Sodium (VOLTAREN) 1 % GEL Apply 4 g topically 4 (four) times daily. (Patient taking differently: Apply 4 g topically as needed (pain).)   fesoterodine (TOVIAZ) 8 MG TB24 tablet TAKE 1 TABLET BY MOUTH EVERY DAY   fexofenadine (ALLEGRA ALLERGY) 60 MG tablet Take 1 tablet (60 mg total) by mouth as needed.   fluticasone (FLONASE) 50 MCG/ACT nasal spray SPRAY 2 SPRAYS INTO EACH NOSTRIL EVERY DAY   ibuprofen (ADVIL) 800 MG tablet Take 1 tablet (800 mg total) by mouth every 8 (eight) hours as needed.   ondansetron (ZOFRAN-ODT) 4 MG disintegrating tablet Take 4 mg by mouth 3 (three) times daily as needed.   PROLIA 60 MG/ML SOSY injection TO BE ADMINISTERED IN PHYSICIAN'S OFFICE. INJECT ONE SYRINGE SUBCOUSLY ONCE EVERY 6 MONTHS. REFRIGERATE. USE WITHIN 14 DAYS ONCE AT ROOM TEMPERATURE.   sennosides-docusate sodium (SENOKOT-S) 8.6-50 MG tablet Take 1 tablet by mouth daily.   [DISCONTINUED] atorvastatin (LIPITOR) 40 MG tablet Take 1 tablet by mouth daily.   [DISCONTINUED] Blood Glucose Monitoring Suppl (ACCU-CHEK GUIDE) w/Device KIT 1 each by Does not apply route 4 (four) times daily -  before meals and at bedtime.   [DISCONTINUED] Blood Glucose Monitoring  Suppl (ONETOUCH VERIO) w/Device KIT Please use to check blood sugar three times daily. E11.9   [DISCONTINUED] Calcium Carb-Cholecalciferol (OYSTER SHELL CALCIUM W/D) 500-5 MG-MCG TABS TAKE 1 TABLET BY MOUTH EVERY DAY WITH BREAKFAST   [DISCONTINUED] glucose blood (ACCU-CHEK GUIDE) test strip Check twice a day. Not on Insulin.   [DISCONTINUED] Lancet Devices (ONE TOUCH DELICA LANCING DEV) MISC Please use to check blood sugar three times daily. E11.9   [DISCONTINUED] OneTouch  Delica Lancets 90W MISC Please use to check blood sugar three times daily. E11.9   [DISCONTINUED] traMADol (ULTRAM) 50 MG tablet Take 1 tablet (50 mg total) by mouth every 8 (eight) hours as needed.   Current Facility-Administered Medications for the 01/17/22 encounter (Office Visit) with Werner Lean, MD  Medication   methylPREDNISolone acetate (DEPO-MEDROL) injection 40 mg     Allergies:   Patient has no known allergies.   Social History   Socioeconomic History   Marital status: Single    Spouse name: Not on file   Number of children: Not on file   Years of education: Not on file   Highest education level: Not on file  Occupational History   Not on file  Tobacco Use   Smoking status: Never   Smokeless tobacco: Never  Substance and Sexual Activity   Alcohol use: Not on file   Drug use: Never   Sexual activity: Never  Other Topics Concern   Not on file  Social History Narrative   Not on file   Social Determinants of Health   Financial Resource Strain: Not on file  Food Insecurity: Not on file  Transportation Needs: Not on file  Physical Activity: Not on file  Stress: Not on file  Social Connections: Not on file    Family History: The patient and niece deny family history of medical problems. No heart disease in family.  ROS:   Please see the history of present illness.    All other systems reviewed and are negative.  EKGs/Labs/Other Studies Reviewed:    The following studies were reviewed today:  EKG:   01/17/22: SR LVH with rare PACs 05/06/20 SR 91 no ST/T changes 09/10/19:  SR rate 90 no ST/T changes  Cardiac Event Monitoring: Date: 06/06/20 Results: Patient had a minimum heart rate of 60 bpm, maximum heart rate of 184 bpm, and average heart rate of 85 bpm. Predominant underlying rhythm was sinus rhythm. One run of ventricular tachycardia occurred lasting 4 beats with a max rate of 146 bpm. Ten runs of supraventricular tachycardia occurred  lasting 12 beats at longest with a max rate of 184 bpm. Isolated PACs were occasional (1.3%), with rare couplets and triplets present. Isolated PVCs were rare (<1.0%), with rare couplets and bigeminy present. No evidence of complete heart block. Triggered and diary events associated with sinus rhythm and PACs.   Occasional PACs and supraventricular tachycardia. No malignant arrhythmias.   Recent Labs: 08/25/2021: BNP 144.3; BUN 12; Creatinine, Ser 0.62; Hemoglobin 15.7; Platelets 192; Potassium 4.5; Sodium 140; TSH 2.280 09/08/2021: ALT 25  Recent Lipid Panel    Component Value Date/Time   CHOL 113 08/25/2021 1417   TRIG 72 08/25/2021 1417   HDL 57 08/25/2021 1417   CHOLHDL 2.0 08/25/2021 1417   CHOLHDL 3.0 08/26/2019 1530   VLDL 19 08/26/2019 1530   LDLCALC 41 08/25/2021 1417    Physical Exam:    VS:  BP 120/80   Pulse 65   Ht 5' 3"  (1.6 m)   Wt  196 lb (88.9 kg)   SpO2 96%   BMI 34.72 kg/m     Repeat BP Large Cuff left arm 120/80  Wt Readings from Last 3 Encounters:  01/17/22 196 lb (88.9 kg)  09/18/21 199 lb 3.2 oz (90.4 kg)  08/25/21 198 lb 12.8 oz (90.2 kg)    Gen: no distress   Neck: No JVD Cardiac: No Rubs or Gallops, no Murmur, RRR +2 radial pulses Respiratory: Clear to auscultation bilaterally, normal effort, normal  respiratory rate GI: Soft, nontender, non-distended  MS: No  edema;  moves all extremities Integument: Skin feels warm Neuro:  At time of evaluation, alert and oriented to person/place/time/situation  Psych: Normal affect, patient feels well   ASSESSMENT:    1. PAC (premature atrial contraction)   2. Diabetes mellitus with coincident hypertension ([place])   3. LVH (left ventricular hypertrophy)     PLAN:    PACs HTN with DM LVH - ambulatory blood pressure not done - repeat BP WNL - continue home medications: Coreg and norvasc - LDL< 55 on no therapy  One year per family preference, otherwise PRN    Medication Adjustments/Labs  and Tests Ordered: Current medicines are reviewed at length with the patient today.  Concerns regarding medicines are outlined above.  Orders Placed This Encounter  Procedures   EKG 12-Lead   No orders of the defined types were placed in this encounter.   Patient Instructions  Medication Instructions:  Your physician recommends that you continue on your current medications as directed. Please refer to the Current Medication list given to you today.  *If you need a refill on your cardiac medications before your next appointment, please call your pharmacy*   Lab Work: NONE If you have labs (blood work) drawn today and your tests are completely normal, you will receive your results only by: [place] (if you have MyChart) OR A paper copy in the mail If you have any lab test that is abnormal or we need to change your treatment, we will call you to review the results.   Testing/Procedures: NONE   Follow-Up: At [facility], you and your health needs are our priority.  As part of our continuing mission to provide you with exceptional heart care, we have created designated Provider Care Teams.  These Care Teams include your primary Cardiologist (physician) and Advanced Practice Providers (APPs -  Physician Assistants and Nurse Practitioners) who all work together to provide you with the care you need, when you need it.    Your next appointment:   1 year(s)  The format for your next appointment:   In Person  Provider:   Werner Lean, MD   Important Information About Sugar         Signed, Werner Lean, MD  01/17/2022 4:46 PM    [place]

## 2022-03-05 ENCOUNTER — Encounter: Payer: Self-pay | Admitting: Podiatry

## 2022-03-05 ENCOUNTER — Ambulatory Visit (INDEPENDENT_AMBULATORY_CARE_PROVIDER_SITE_OTHER): Payer: Medicare Other | Admitting: Podiatry

## 2022-03-05 DIAGNOSIS — B351 Tinea unguium: Secondary | ICD-10-CM

## 2022-03-05 DIAGNOSIS — M79675 Pain in left toe(s): Secondary | ICD-10-CM | POA: Diagnosis not present

## 2022-03-05 DIAGNOSIS — M79674 Pain in right toe(s): Secondary | ICD-10-CM

## 2022-03-05 DIAGNOSIS — E119 Type 2 diabetes mellitus without complications: Secondary | ICD-10-CM

## 2022-03-05 NOTE — Progress Notes (Signed)
This patient presents to the office with chief complaint of long thick nails and diabetic feet.  This patient  says there  is  no pain and discomfort in her feet.  This patient says there are long thick painful nails.  These nails are painful walking and wearing shoes.  Patient has no history of infection or drainage from both feet.  Patient is unable to  self treat her own nails . This patient presents  to the office today for treatment of the  long nails and a foot evaluation due to history of  diabetes.  General Appearance  Alert, conversant and in no acute stress.  Vascular  Dorsalis pedis  pulses are palpable  bilaterally. Posterior tibial pulses are weakly palpable  B/L.  Capillary return is within normal limits  bilaterally. Temperature is within normal limits  bilaterally.  Neurologic  Senn-Weinstein monofilament wire test within normal limits  bilaterally. Muscle power within normal limits bilaterally.  Nails Thick disfigured discolored nails with subungual debris  from hallux to fifth toes bilaterally. No evidence of bacterial infection or drainage bilaterally.  Orthopedic  No limitations of motion of motion feet .  No crepitus or effusions noted.  No bony pathology or digital deformities noted.  Skin  normotropic skin with no porokeratosis noted bilaterally.  No signs of infections or ulcers noted.     Onychomycosis  Diabetes with no foot complications  IE  Debride nails x 10 with nail nipper followed by dremel tool usage. A diabetic foot exam was performed and there is no evidence of any vascular or neurologic pathology.   RTC 3 months.   Gardiner Barefoot DPM

## 2022-03-14 NOTE — Progress Notes (Signed)
    SUBJECTIVE:   CHIEF COMPLAINT / HPI: left side pain?  Left supra pubic pain: Patient states she has had a 2 weeks history of left side suprapubic pain that radiates to back, rated at 6/10, intermittent no alleivating factors, tylenol no, moving around makes pain worse, no fevers nausea or vomiting, no pain with urination, no constipation, no blood in urine, no falls.  Vaginal itching - 2 days of itching, sometimes will stop, inside of vagina, no discharge. Has had previous episode which was thought to be atrophic vaginitis.   Health Maintenance - Patient is happy to get Flu, and Pneumoccoal vaccine today.   PERTINENT  PMH / PSH: HTN, DM2, HTN, HFpEF, Osteoporosis  OBJECTIVE:   BP (!) 145/79   Pulse 79   Ht 5\' 3"  (1.6 m)   Wt 194 lb 6.4 oz (88.2 kg)   SpO2 100%   BMI 34.44 kg/m   General: NAD Abdomen: soft, NTTP, no rebound or guarding GU: Normal external genitalia, no obvious lesions or erythema of bilateral labia or vaginal orifice, mild atrophy of vaginal tissue, on speculum exam round cervix with no obvious lesions or discharge, no cervical motion tenderness, no masses palpated on bimanual exam Extremities: Moving all 4 extremities equally   ASSESSMENT/PLAN:   Vaginal itching 2 day history with prior episode in March. Unclear what treatment was at that time. Wet prep negative. External and internal vaginal exam no obvious lesions. Likely atrophic vaginitis versus BV or fungal candidiasis.  -Recommend trying vaginal moisturizer such as Vagisil over-the-counter  Diabetes mellitus with coincident hypertension ([place]) - Point-of-care A1c ordered for today - BMP ordered - We will follow-up chronic illnesses at next visit in 1 month  Healthcare maintenance - Patient received flu vaccine and pneumococcal vaccine today   Left lower quadrant abdominal pain Intermittent sharp stabbing pain most consistent with possible left-sided nephrolithiasis, but will rule out UTI.  UA  with reflex microscopy positive for UTI.  If symptoms do not resolve resolve with treatment, plan for CT abdomen pelvis with and without contrast to evaluate for nephrolithiasis or alternate pathology of kidney, colon, or uteroovarian system. Discussed this plan with patient. -CBC -UA, reflex microscopy -Macrobid 100 mg twice daily for 5 days  Orders Placed This Encounter  Procedures   Flu Vaccine QUAD High Dose(Fluad)   Pneumococcal conjugate vaccine 20-valent (Prevnar 20)   Basic metabolic panel   CBC with Differential   HgB A1c   POCT Wet Prep [facility])   POCT urinalysis dipstick   POCT UA - Microscopic Only     Salvadore Oxford, MD [place]

## 2022-03-15 ENCOUNTER — Telehealth: Payer: Self-pay | Admitting: Family Medicine

## 2022-03-15 ENCOUNTER — Encounter: Payer: Self-pay | Admitting: Family Medicine

## 2022-03-15 ENCOUNTER — Ambulatory Visit (INDEPENDENT_AMBULATORY_CARE_PROVIDER_SITE_OTHER): Payer: Medicare Other | Admitting: Family Medicine

## 2022-03-15 VITALS — BP 145/79 | HR 79 | Ht 63.0 in | Wt 194.4 lb

## 2022-03-15 DIAGNOSIS — I10 Essential (primary) hypertension: Secondary | ICD-10-CM

## 2022-03-15 DIAGNOSIS — Z23 Encounter for immunization: Secondary | ICD-10-CM

## 2022-03-15 DIAGNOSIS — Z0000 Encounter for general adult medical examination without abnormal findings: Secondary | ICD-10-CM

## 2022-03-15 DIAGNOSIS — R1032 Left lower quadrant pain: Secondary | ICD-10-CM

## 2022-03-15 DIAGNOSIS — E119 Type 2 diabetes mellitus without complications: Secondary | ICD-10-CM | POA: Diagnosis not present

## 2022-03-15 DIAGNOSIS — N898 Other specified noninflammatory disorders of vagina: Secondary | ICD-10-CM | POA: Diagnosis not present

## 2022-03-15 LAB — POCT URINALYSIS DIP (MANUAL ENTRY)
Bilirubin, UA: NEGATIVE
Glucose, UA: NEGATIVE mg/dL
Ketones, POC UA: NEGATIVE mg/dL
Nitrite, UA: NEGATIVE
Protein Ur, POC: NEGATIVE mg/dL
Spec Grav, UA: 1.01 (ref 1.010–1.025)
Urobilinogen, UA: 0.2 E.U./dL
pH, UA: 7 (ref 5.0–8.0)

## 2022-03-15 LAB — POCT GLYCOSYLATED HEMOGLOBIN (HGB A1C)

## 2022-03-15 LAB — POCT UA - MICROSCOPIC ONLY: WBC, Ur, HPF, POC: NONE SEEN (ref 0–5)

## 2022-03-15 LAB — POCT WET PREP (WET MOUNT)
Clue Cells Wet Prep Whiff POC: NEGATIVE
Trichomonas Wet Prep HPF POC: ABSENT
WBC, Wet Prep HPF POC: >20

## 2022-03-15 MED ORDER — NITROFURANTOIN MONOHYD MACRO 100 MG PO CAPS: 100.0000 mg | ORAL_CAPSULE | Freq: Two times a day (BID) | ORAL | 0 refills | Status: DC

## 2022-03-15 NOTE — Assessment & Plan Note (Addendum)
-   Point-of-care A1c ordered for today - BMP ordered - We will follow-up chronic illnesses at next visit in 1 month, discussed with patient

## 2022-03-15 NOTE — Assessment & Plan Note (Signed)
>>  ASSESSMENT AND PLAN FOR DIABETES MELLITUS WITH COINCIDENT HYPERTENSION ([place]) WRITTEN ON 03/15/2022  5:26 PM BY Salvadore Oxford, MD  - Point-of-care A1c ordered for today - BMP ordered - We will follow-up chronic illnesses at next visit in 1 month, discussed with patient

## 2022-03-15 NOTE — Patient Instructions (Addendum)
It was great to see you! Thank you for allowing me to participate in your care!  I recommend that you always bring your medications to each appointment as this makes it easy to ensure we are on the correct medications and helps [place] not miss when refills are needed.  Our plans for today:  - If your urine analysis is positive I will send an antibiotic to your pharmacy to pick up. If it is negative I will order the CT scans to look at your kidneys for possible kidney stone.  - We discussed your vaginal itching this is likely atrophic vaginitis. Please try a vaginal moisturizer.   We are checking some labs today, I will call you if they are abnormal will send you a MyChart message or a letter if they are normal.  If you do not hear about your labs in the next 2 weeks please let us know.  Take care and seek immediate care sooner if you develop any concerns.   Dr. Salvadore Oxford, MD [place]

## 2022-03-15 NOTE — Assessment & Plan Note (Signed)
-   Patient received flu vaccine and pneumococcal vaccine today

## 2022-03-15 NOTE — Assessment & Plan Note (Addendum)
2 day history with prior episode in March. Unclear what treatment was at that time. Wet prep negative. External and internal vaginal exam no obvious lesions. Likely atrophic vaginitis versus BV or fungal candidiasis.  -Recommend trying vaginal moisturizer such as Vagisil over-the-counter

## 2022-03-15 NOTE — Telephone Encounter (Signed)
Urinalysis and UA microscopy show possible urinary tract infection.  As stated in note from today's clinic, I will treat empirically with Macrobid before considering further imaging for this patient's pain.

## 2022-03-16 ENCOUNTER — Other Ambulatory Visit: Payer: Self-pay | Admitting: Family Medicine

## 2022-03-16 ENCOUNTER — Other Ambulatory Visit: Payer: Self-pay | Admitting: Internal Medicine

## 2022-03-16 DIAGNOSIS — E119 Type 2 diabetes mellitus without complications: Secondary | ICD-10-CM

## 2022-03-16 DIAGNOSIS — I491 Atrial premature depolarization: Secondary | ICD-10-CM

## 2022-03-16 DIAGNOSIS — M818 Other osteoporosis without current pathological fracture: Secondary | ICD-10-CM

## 2022-03-16 LAB — BASIC METABOLIC PANEL
BUN/Creatinine Ratio: 19 (ref 12–28)
BUN: 13 mg/dL (ref 8–27)
CO2: 23 mmol/L (ref 20–29)
Calcium: 9.8 mg/dL (ref 8.7–10.3)
Chloride: 99 mmol/L (ref 96–106)
Creatinine, Ser: 0.7 mg/dL (ref 0.57–1.00)
Glucose: 97 mg/dL (ref 70–99)
Potassium: 4 mmol/L (ref 3.5–5.2)
Sodium: 140 mmol/L (ref 134–144)
eGFR: 91 mL/min/{1.73_m2} (ref >59)

## 2022-03-16 LAB — CBC WITH DIFFERENTIAL/PLATELET
Basophils Absolute: 0 10*3/uL (ref 0.0–0.2)
Basos: 0 %
EOS (ABSOLUTE): 0.1 10*3/uL (ref 0.0–0.4)
Eos: 1 %
Hematocrit: 42.7 % (ref 34.0–46.6)
Hemoglobin: 14.6 g/dL (ref 11.1–15.9)
Immature Grans (Abs): 0 10*3/uL (ref 0.0–0.1)
Immature Granulocytes: 0 %
Lymphocytes Absolute: 2.5 10*3/uL (ref 0.7–3.1)
Lymphs: 38 %
MCH: 30.9 pg (ref 26.6–33.0)
MCHC: 34.2 g/dL (ref 31.5–35.7)
MCV: 90 fL (ref 79–97)
Monocytes Absolute: 0.6 10*3/uL (ref 0.1–0.9)
Monocytes: 10 %
Neutrophils Absolute: 3.3 10*3/uL (ref 1.4–7.0)
Neutrophils: 51 %
Platelets: 182 10*3/uL (ref 150–450)
RBC: 4.73 x10E6/uL (ref 3.77–5.28)
RDW: 12.3 % (ref 11.7–15.4)
WBC: 6.4 10*3/uL (ref 3.4–10.8)

## 2022-03-19 ENCOUNTER — Other Ambulatory Visit: Payer: Self-pay

## 2022-04-16 ENCOUNTER — Other Ambulatory Visit (INDEPENDENT_AMBULATORY_CARE_PROVIDER_SITE_OTHER): Payer: Medicare Other | Admitting: Family Medicine

## 2022-04-16 ENCOUNTER — Encounter: Payer: Self-pay | Admitting: Family Medicine

## 2022-04-16 ENCOUNTER — Other Ambulatory Visit: Payer: Self-pay | Admitting: *Deleted

## 2022-04-16 ENCOUNTER — Ambulatory Visit (INDEPENDENT_AMBULATORY_CARE_PROVIDER_SITE_OTHER): Payer: Medicare Other | Admitting: Family Medicine

## 2022-04-16 VITALS — BP 129/85 | HR 74 | Ht 63.0 in | Wt 195.2 lb

## 2022-04-16 DIAGNOSIS — M479 Spondylosis, unspecified: Secondary | ICD-10-CM | POA: Diagnosis not present

## 2022-04-16 DIAGNOSIS — M810 Age-related osteoporosis without current pathological fracture: Secondary | ICD-10-CM

## 2022-04-16 DIAGNOSIS — E119 Type 2 diabetes mellitus without complications: Secondary | ICD-10-CM

## 2022-04-16 LAB — POCT GLYCOSYLATED HEMOGLOBIN (HGB A1C): HbA1c, POC (controlled diabetic range): 5.5 % (ref 0.0–7.0)

## 2022-04-16 MED ORDER — BACLOFEN 10 MG PO TABS: 5.0000 mg | ORAL_TABLET | Freq: Every day | ORAL | 0 refills | Status: DC | PRN

## 2022-04-16 MED ORDER — PROLIA 60 MG/ML ~~LOC~~ SOSY: PREFILLED_SYRINGE | SUBCUTANEOUS | 0 refills | Status: DC

## 2022-04-16 NOTE — Assessment & Plan Note (Signed)
>>  ASSESSMENT AND PLAN FOR OSTEOARTHRITIS OF LOWER BACK WRITTEN ON 04/16/2022  5:20 PM BY Salvadore Oxford, MD  Back pain is chronic without new acute change. Less likely vertebral compression fracture but will keep in mind at follow-up.  Exam reassuring, denies sciatic symptoms.  Red flag symptoms negative.  Likely chronic osteoarthritis. -Recommend to continue Tylenol as needed, addition of Baclofen as needed 5 mg -Referral to physical therapy sent, for back muscle strengthening exercises.

## 2022-04-16 NOTE — Patient Instructions (Addendum)
It was great to see you! Thank you for allowing me to participate in your care!  I recommend that you always bring your medications to each appointment as this makes it easy to ensure we are on the correct medications and helps [place] not miss when refills are needed.  Our plans for today:  -I have sent baclofen to your pharmacy.  To use as needed as needed for back pain. -I have sent your Prolia prescription to your pharmacy this medication you will pick up and then make an appointment with our clinic to supervise injection. -I have made a referral to physical therapy to work on strengthening your back. -Please confirm that your next DEXA scan is on May 14, 2022.   Take care and seek immediate care sooner if you develop any concerns.   Dr. Salvadore Oxford, MD [place]

## 2022-04-16 NOTE — Progress Notes (Cosign Needed Addendum)
    SUBJECTIVE:   CHIEF COMPLAINT / HPI:  follow-up and back pain  History provided by niece and patient, patient deferred interpreter and preference of these.  Back pain - Multiple year long history of chronic back pain, primarily in lumbar spinal area.  Patient unable to describe quality of pain.,  Worse in the morning does not improve with movement.  Improves with bracing with belt.  She does not wear this all the time.  Has tried Tylenol with mild relief.  States she previously had an injection in her back at [facility], unsure of what kind of injection, not sure if this helped.  No B symptoms, no weight loss.  No loss of bowel or bladder function.  No trauma, No acute worsening of pain. Previously on Prolia.  Follow-up - Flank pain resolved after treatment with Macrobid.    PERTINENT  PMH / PSH: Diabetes, HTN, Cirrhosis, Osteoarthritis  OBJECTIVE:   BP 129/85   Pulse 74   Ht 5\' 3"  (1.6 m)   Wt 195 lb 4 oz (88.6 kg)   SpO2 96%   BMI 34.59 kg/m   General: NAD  Cardiovascular: RRR, no murmurs, no peripheral edema Respiratory: normal WOB on RA Back: No obvious deformities, no erythema, tenderness to palpation along L4-L5 vertebrae, as well as some lumbar paraspinal muscle tenderness, full range of motion in forward flexion, limited back extension due to body habitus, full lateral flexion, full left and right rotation. Extremities: Moving all 4 extremities equally  Imaging Abdominal CT scan in 2021 shows lumbar facet arthropathy and mild foraminal stenosis.  ASSESSMENT/PLAN:   Osteoporosis Restart Prolia, continue with new DEXA scan scheduled for November 27th, 2023.  Plan for patient to pick up Prolia at pharmacy, and then schedule appointment for nurse visit to supervise injection.  Given patient and niece instructions to call clinic if problems arise. -Prolia once every 6 months  Osteoarthritis of lower back Back pain is chronic without new acute change. Less likely vertebral  compression fracture but will keep in mind at follow-up.  Exam reassuring, denies sciatic symptoms.  Red flag symptoms negative.  Likely chronic osteoarthritis. -Recommend to continue Tylenol as needed, addition of Baclofen as needed 5 mg -Referral to physical therapy sent, for back muscle strengthening exercises.     Salvadore Oxford, MD [place]

## 2022-04-16 NOTE — Assessment & Plan Note (Signed)
Restart Prolia, continue with new DEXA scan scheduled for November 27th, 2023.  Plan for patient to pick up Prolia at pharmacy, and then schedule appointment for nurse visit to supervise injection.  Given patient and niece instructions to call clinic if problems arise. -Prolia once every 6 months

## 2022-04-16 NOTE — Assessment & Plan Note (Signed)
Back pain is chronic without new acute change. Less likely vertebral compression fracture but will keep in mind at follow-up.  Exam reassuring, denies sciatic symptoms.  Red flag symptoms negative.  Likely chronic osteoarthritis. -Recommend to continue Tylenol as needed, addition of Baclofen as needed 5 mg -Referral to physical therapy sent, for back muscle strengthening exercises.

## 2022-04-19 ENCOUNTER — Telehealth: Payer: Self-pay

## 2022-04-19 NOTE — Telephone Encounter (Signed)
Received call from [facility].   Delivery for Prolia will be 11/7 between 9am-4pm.  Will schedule nurse visit for first injection once supply arrives.

## 2022-05-03 ENCOUNTER — Other Ambulatory Visit: Payer: Self-pay | Admitting: Family Medicine

## 2022-05-03 ENCOUNTER — Ambulatory Visit (INDEPENDENT_AMBULATORY_CARE_PROVIDER_SITE_OTHER): Payer: Medicare Other

## 2022-05-03 DIAGNOSIS — M810 Age-related osteoporosis without current pathological fracture: Secondary | ICD-10-CM

## 2022-05-03 NOTE — Progress Notes (Signed)
Patient presents to nurse clinic for prolia injection. Per protocol, patient needs to have BMP, Mag and Phosphorus between 2-4 weeks prior to injection.   Preceptor Lum Babe) placed orders for lab work.   Advised patient that she would need to have labs drawn today. Once results are in and provider has reviewed, patient can be scheduled for nurse visit for administration.   Veronda Prude, RN

## 2022-05-04 ENCOUNTER — Encounter: Payer: Self-pay | Admitting: Family Medicine

## 2022-05-04 ENCOUNTER — Other Ambulatory Visit: Payer: Self-pay | Admitting: Family Medicine

## 2022-05-04 DIAGNOSIS — M479 Spondylosis, unspecified: Secondary | ICD-10-CM

## 2022-05-04 LAB — BASIC METABOLIC PANEL
BUN/Creatinine Ratio: 18 (ref 12–28)
BUN: 12 mg/dL (ref 8–27)
CO2: 26 mmol/L (ref 20–29)
Calcium: 9.8 mg/dL (ref 8.7–10.3)
Chloride: 100 mmol/L (ref 96–106)
Creatinine, Ser: 0.67 mg/dL (ref 0.57–1.00)
Glucose: 79 mg/dL (ref 70–99)
Potassium: 3.9 mmol/L (ref 3.5–5.2)
Sodium: 139 mmol/L (ref 134–144)
eGFR: 92 mL/min/{1.73_m2} (ref >59)

## 2022-05-04 LAB — PHOSPHORUS: Phosphorus: 3.3 mg/dL (ref 3.0–4.3)

## 2022-05-04 LAB — MAGNESIUM: Magnesium: 2 mg/dL (ref 1.6–2.3)

## 2022-05-04 NOTE — Therapy (Signed)
OUTPATIENT PHYSICAL THERAPY THORACOLUMBAR EVALUATION   Patient Name: Tracey Riddle MRN: [id number] DOB:Sep 26, 1950, 71 y.o., female Today's Date: 05/07/2022  END OF SESSION:  PT End of Session - 05/07/22 1629     Visit Number 1    Number of Visits 15   1-2x week   Date for PT Re-Evaluation 07/02/22    Authorization Type Medicare AB    Progress Note Due on Visit 10    PT Start Time 1630    PT Stop Time 1715    PT Time Calculation (min) 45 min    Activity Tolerance Patient tolerated treatment well;No increased pain    Behavior During Therapy [facility] for tasks assessed/performed             Past Medical History:  Diagnosis Date   Abnormal liver CT 08/27/2019   CT AP (08/26/19, [facility]): Mildly nodular hepatic contour c/w possible mild cirrhosis.   Diabetes mellitus type II, controlled (HCC) 08/27/2019   Dyspnea 09/03/2021   Hepatitis C    Past Surgical History:  Procedure Laterality Date   BIOPSY  09/07/2019   Procedure: BIOPSY;  Surgeon: Charlott Rakes, MD;  Location: [facility] ENDOSCOPY;  Service: Endoscopy;;   ESOPHAGEAL BRUSHING  09/07/2019   Procedure: ESOPHAGEAL BRUSHING;  Surgeon: Charlott Rakes, MD;  Location: [facility] ENDOSCOPY;  Service: Endoscopy;;   ESOPHAGOGASTRODUODENOSCOPY (EGD) WITH PROPOFOL Left 09/07/2019   Procedure: ESOPHAGOGASTRODUODENOSCOPY (EGD) WITH PROPOFOL;  Surgeon: Charlott Rakes, MD;  Location: [facility] ENDOSCOPY;  Service: Endoscopy;  Laterality: Left;   Patient Active Problem List   Diagnosis Date Noted   Osteoarthritis of lower back 04/16/2022   Pain due to onychomycosis of toenails of both feet 03/05/2022   LVH (left ventricular hypertrophy) 01/17/2022   Cirrhosis of liver without ascites (HCC) 09/18/2021   Hyperlipidemia associated with type 2 diabetes mellitus (HCC) 09/03/2021   Vaginal itching 09/03/2021   Osteoarthritis of right knee 08/14/2020   PAC (premature atrial contraction) 08/11/2020   Osteoporosis 05/19/2020   Diabetes mellitus with  coincident hypertension (HCC) 05/06/2020   Osteoarthritis of left knee 05/04/2020   Overactive bladder 12/31/2019   Healthcare maintenance 12/31/2019   Dental caries 12/30/2019   Hypertension 11/21/2019   Hepatitis C virus infection cured after antiviral drug therapy 09/08/2019   Abnormal liver CT 08/27/2019   Type 2 diabetes mellitus (HCC) 08/27/2019   Hypoproteinemia (HCC) 08/27/2019    PCP: Celine Mans, MD  REFERRING PROVIDER: McDiarmid, Leighton Roach, MD  REFERRING DIAG: M81.0 (ICD-10-CM) - Age-related osteoporosis without current pathological fracture M47.9 (ICD-10-CM) - Osteoarthritis of lower back  Rationale for Evaluation and Treatment: Rehabilitation  THERAPY DIAG:  Other low back pain  Other abnormalities of gait and mobility  Muscle weakness (generalized)  ONSET DATE: a couple years ago, gradual onset, fluctuating  SUBJECTIVE:  SUBJECTIVE STATEMENT: Initial use of video interpreter services - pt politely declines in favor of daughter interpreting. Daughter also assists with history w/ pt consent.  States back has always bothered her, has ups and downs. Gradual onset, no recent changes. States that they spoke with doctor and they feel she could benefit from PT although symptoms remain about the same. Chronic history of urinary incontinence, unchanged recently. Denies numbness or tingling, saddle anesthesia, buckling or overt weakness, no unintentional weight changes, no night sweat. Does feel her L leg is a little weaker than the right, began about a couple years ago when she had a biopsy. Able to perform majority of activities with variable increases in pain levels. Cardiac hx and DM both well controlled per daughter   PERTINENT HISTORY:  HTN, DM2, LVH, arrhythmia, cirrhosis  PAIN:   Are you having pain: no Location: low back, bilateral along belt line How would you describe your pain? Sharp at its worst Best in past week: 0/10 Worst in past week: 6-7/10 Aggravating factors: housework, standing/walking, lower body dressing, prolonged sitting (~1 hr) Easing factors: repositioning, stretching,    PRECAUTIONS: osteoporosis, cardiac hx  WEIGHT BEARING RESTRICTIONS: none  FALLS:  Has patient fallen in last 6 months? No  LIVING ENVIRONMENT: Apartment, stairs within home (>10 steps to bedroom 1 rail), 2STE with rail, lives with her sister  OCCUPATION: retired - enjoys being around family  PLOF: Independent with SPC  PATIENT GOALS: be able to do exercises independently, move more normally   OBJECTIVE:   DIAGNOSTIC FINDINGS:  None recently  PATIENT SURVEYS:  FOTO deferred given time constraints and family interpreting  SCREENING FOR RED FLAGS: Unremarkable red flag questioning  COGNITION: Overall cognitive status: Within functional limits for tasks assessed     SENSATION: Light touch intact B LE  POSTURE: increased kyphosis, forward head, upper trap elevation, and reduced lordosis  PALPATION: Deferred on eval given time constraints  LUMBAR ROM:   AROM eval  Flexion 100% no pain, mid shin  Extension 75% pain in tailbone  Right lateral flexion   Left lateral flexion   Right rotation 50% p!  Left rotation 25% painless   (Blank rows = not tested) Comments: pain only during movement, does not persist after cessation of movement  LOWER EXTREMITY ROM:     Active  Right eval Left eval  Hip flexion    Hip extension    Hip internal rotation    Hip external rotation     (Blank rows = not tested)  Comments:    LOWER EXTREMITY MMT:    MMT Right eval Left eval  Hip flexion 4 3+  Hip abduction (modified sitting) 4+ 4+  Hip internal rotation 4 4  Hip external rotation 4 4  Knee flexion 4 4-  Knee extension 4 4-   (Blank rows = not  tested)  Comments: painless throughout   FUNCTIONAL TESTS:  5xSTS: standard chair 21 sec no UE support, denies increase in pain  GAIT: Distance walked: within clinic Assistive device utilized: Single point cane Level of assistance: Modified independence Comments: partial step through pattern SPC in RUE, reduced gait speed and cadence  TODAY'S TREATMENT:  [facility] Adult PT Treatment:                                                DATE: 05/07/22 Therapeutic Exercise: Adductor iso, seated w/ pillow squeeze x10 cues for form and HEP  PATIENT EDUCATION:  Education details: Pt education on PT impairments, prognosis, and POC. Informed consent. Rationale for interventions, safe/appropriate HEP performance. Provided with HEP handout in [place], daughter states she will assist w/ translation Person educated: Patient and daughter Education method: Explanation, Demonstration, Tactile cues, Verbal cues, and Handouts Education comprehension: verbalized understanding, returned demonstration, verbal cues required, tactile cues required, and needs further education    HOME EXERCISE PROGRAM: Access Code: [email or web address] URL: [email or web address] Date: 05/07/2022 Prepared by: Fransisco Hertz  Exercises - Seated Hip Adduction Isometrics with Ball  - 1 x daily - 7 x weekly - 3 sets - 10 reps  ASSESSMENT:  CLINICAL IMPRESSION: Patient is a 71 y.o. woman who was seen today for physical therapy evaluation and treatment for low back pain and osteoporosis. Pt reports back pain for years with various fluctuations - able to perform majority of activities but does have pain/difficulty depending on symptom severity. Today pt demonstrates LLE weakness which she states is ongoing for last couple of years, as well as limitations in lumbar mobility. 5xSTS performed in 21sec which is  indicative of fall risk (cutoff score 12-14sec varying by population). Pt tolerates examination and HEP well with no adverse events, denies any pain on departure. Recommend skilled PT to address aforementioned deficits and reduce fall risk. Pt departs today's session in no acute distress, all voiced questions/concerns addressed appropriately from PT perspective.    OBJECTIVE IMPAIRMENTS: Abnormal gait, decreased activity tolerance, decreased endurance, decreased mobility, difficulty walking, decreased ROM, decreased strength, postural dysfunction, and pain.   ACTIVITY LIMITATIONS: carrying, lifting, bending, sitting, standing, squatting, stairs, transfers, and locomotion level  PARTICIPATION LIMITATIONS: meal prep, cleaning, laundry, driving, shopping, and community activity  PERSONAL FACTORS: Age, Time since onset of injury/illness/exacerbation, and 3+ comorbidities: HTN, DM, cardiac hx, osteoporosis  are also affecting patient's functional outcome.   REHAB POTENTIAL: Good  CLINICAL DECISION MAKING: Stable/uncomplicated  EVALUATION COMPLEXITY: Low   GOALS: Goals reviewed with patient? No  SHORT TERM GOALS: Target date: 06/04/2022    Pt will demonstrate appropriate understanding and performance of initially prescribed HEP in order to facilitate improved independence with management of symptoms.  Baseline: HEP provided on eval Goal status: INITIAL    LONG TERM GOALS: Target date: 07/02/2022    Pt will demonstrate independence with/without caregiver support for final HEP. Baseline: initial HEP prescribed Goal status: INITIAL  2.  Pt will demonstrate grossly symmetrical lumbar rotation ROM with less than 2/10 pain on NPS in order to demonstrate improved tolerance to functional mobility. Baseline: see ROM chart as above Goal status: INITIAL  3.  Pt will demonstrate grossly symmetrical hip MMT in order to demonstrate improved strength/mechanics for functional movements.  Baseline:  see MMT chart above Goal status: INITIAL  4. Pt will perform 5xSTS in <13 sec in order to demonstrate reduced fall risk and improved functional independence. (MCID of 2.3sec)  Baseline: 21sec standard chair no UE support  Goal status: INITIAL   5. Pt will report ability to perform lower body dressing with less than 3/10 pain on NPS in order to demonstrate improved tolerance to ADLs.  Baseline: pain w/ lower body dressing up to 6-7/10  Goal status: INITIAL  PLAN:  PT FREQUENCY: 1-2x/week  PT DURATION: 8 weeks  PLANNED INTERVENTIONS: Therapeutic exercises, Therapeutic activity, Neuromuscular re-education, Balance training, Gait training, Patient/Family education, Self Care, Joint mobilization, Stair training, DME instructions, Aquatic Therapy, Dry Needling, Spinal mobilization, Cryotherapy, Moist heat, Taping, Manual therapy, and Re-evaluation.  PLAN FOR NEXT SESSION: Progress ROM/strengthening exercises as able/appropriate, review HEP.    Ashley Murrain PT, DPT 05/07/2022 5:34 PM

## 2022-05-07 ENCOUNTER — Encounter: Payer: Self-pay | Admitting: Physical Therapy

## 2022-05-07 ENCOUNTER — Other Ambulatory Visit: Payer: Self-pay

## 2022-05-07 ENCOUNTER — Ambulatory Visit: Payer: Medicare Other | Attending: Family Medicine | Admitting: Physical Therapy

## 2022-05-07 DIAGNOSIS — M810 Age-related osteoporosis without current pathological fracture: Secondary | ICD-10-CM | POA: Diagnosis not present

## 2022-05-07 DIAGNOSIS — M5459 Other low back pain: Secondary | ICD-10-CM | POA: Insufficient documentation

## 2022-05-07 DIAGNOSIS — M6281 Muscle weakness (generalized): Secondary | ICD-10-CM | POA: Insufficient documentation

## 2022-05-07 DIAGNOSIS — R2689 Other abnormalities of gait and mobility: Secondary | ICD-10-CM | POA: Insufficient documentation

## 2022-05-07 DIAGNOSIS — M479 Spondylosis, unspecified: Secondary | ICD-10-CM | POA: Diagnosis not present

## 2022-05-08 ENCOUNTER — Other Ambulatory Visit: Payer: Self-pay | Admitting: Family Medicine

## 2022-05-08 ENCOUNTER — Ambulatory Visit (INDEPENDENT_AMBULATORY_CARE_PROVIDER_SITE_OTHER): Payer: Medicare Other

## 2022-05-08 ENCOUNTER — Other Ambulatory Visit: Payer: Self-pay

## 2022-05-08 VITALS — BP 144/76 | HR 65 | Temp 97.8°F

## 2022-05-08 DIAGNOSIS — M810 Age-related osteoporosis without current pathological fracture: Secondary | ICD-10-CM | POA: Diagnosis present

## 2022-05-08 DIAGNOSIS — N3941 Urge incontinence: Secondary | ICD-10-CM

## 2022-05-08 MED ORDER — DENOSUMAB 60 MG/ML ~~LOC~~ SOSY
60.0000 mg | PREFILLED_SYRINGE | Freq: Once | SUBCUTANEOUS | Status: AC
  Administered 2022-05-08: 60 mg via SUBCUTANEOUS

## 2022-05-08 MED ORDER — OYSTER SHELL CALCIUM W/D 500-5 MG-MCG PO TABS: ORAL_TABLET | ORAL | 3 refills | Status: DC

## 2022-05-08 MED ORDER — FESOTERODINE FUMARATE ER 8 MG PO TB24: 8.0000 mg | ORAL_TABLET | Freq: Every day | ORAL | 3 refills | Status: DC

## 2022-05-08 NOTE — Progress Notes (Signed)
Patient presents with daughter to nurse clinic for Prolia injection.  Patient received labs for BMP, Mag, and Phosphorus on 05/03/22. Labs were WNL, received okay to proceed with injection from Dr. Lum Babe.   Patient is currently taking Calcium- Vitamin D combination pill 500 mg- 5 mcg once daily. Precepted with Dr. Lum Babe who advised that patient increase to BID, to give total Calcium of 1,000 mg daily and 400 units of Vitamin D daily. Dr. Barb Merino sent in refill with updated dosing.   Patient denies recent sick symptoms and vitals are normal. BP: 144/76, HR: 65, Temp: 97.8, SpO2: 96% RA.   Administered SQ in left arm. Site unremarkable, tolerated injection well.   Patient monitored for 15 minutes post injection. No signs of allergic reaction. Scheduled for PCP follow up on 12/8 per Dr. Lum Babe.   Veronda Prude, RN

## 2022-05-14 ENCOUNTER — Encounter: Payer: Medicare Other | Admitting: Physical Therapy

## 2022-05-14 ENCOUNTER — Ambulatory Visit
Admission: RE | Admit: 2022-05-14 | Discharge: 2022-05-14 | Disposition: A | Discharge status: Home / Self Care | Payer: Medicare Other | Source: Ambulatory Visit | Attending: Family Medicine | Admitting: Family Medicine

## 2022-05-14 DIAGNOSIS — M810 Age-related osteoporosis without current pathological fracture: Secondary | ICD-10-CM

## 2022-05-22 ENCOUNTER — Encounter: Payer: Self-pay | Admitting: Podiatry

## 2022-05-24 NOTE — Patient Instructions (Incomplete)
It was great to see you! Thank you for allowing me to participate in your care!  I recommend that you always bring your medications to each appointment as this makes it easy to ensure we are on the correct medications and helps [place] not miss when refills are needed.  Our plans for today:  - *** -    Take care and seek immediate care sooner if you develop any concerns.   Dr. Celine Mans, MD [facility]

## 2022-05-24 NOTE — Progress Notes (Deleted)
    SUBJECTIVE:   CHIEF COMPLAINT / HPI: Prolia follow-up  ***  PERTINENT  PMH / PSH: HTN, DM, Cirrhosis  OBJECTIVE:   There were no vitals taken for this visit.    Labs Phos - 3.3 Mag - 2.0 Normal BMP   ASSESSMENT/PLAN:   No problem-specific Assessment & Plan notes found for this encounter.     Celine Mans, MD [facility]

## 2022-05-25 ENCOUNTER — Ambulatory Visit: Payer: Medicare PPO | Admitting: Physical Therapy

## 2022-05-25 ENCOUNTER — Ambulatory Visit: Payer: Medicare Other | Admitting: Family Medicine

## 2022-05-28 ENCOUNTER — Ambulatory Visit: Payer: Medicare PPO | Admitting: Physical Therapy

## 2022-05-31 NOTE — Therapy (Incomplete)
OUTPATIENT PHYSICAL THERAPY TREATMENT NOTE   Patient Name: Tracey Riddle MRN: [id number] DOB:14-Apr-1951, 71 y.o., female Today's Date: 05/31/2022  PCP: Salvadore Oxford, MD   REFERRING PROVIDER: McDiarmid, Blane Ohara, MD  END OF SESSION:    Past Medical History:  Diagnosis Date   Abnormal liver CT 08/27/2019   CT AP (08/26/19, [facility]): Mildly nodular hepatic contour c/w possible mild cirrhosis.   Diabetes mellitus type II, controlled ([place]) 08/27/2019   Dyspnea 09/03/2021   Hepatitis C    Past Surgical History:  Procedure Laterality Date   BIOPSY  09/07/2019   Procedure: BIOPSY;  Surgeon: Wilford Corner, MD;  Location: [place];  Service: Endoscopy;;   ESOPHAGEAL BRUSHING  09/07/2019   Procedure: ESOPHAGEAL BRUSHING;  Surgeon: Wilford Corner, MD;  Location: [place];  Service: Endoscopy;;   ESOPHAGOGASTRODUODENOSCOPY (EGD) WITH PROPOFOL Left 09/07/2019   Procedure: ESOPHAGOGASTRODUODENOSCOPY (EGD) WITH PROPOFOL;  Surgeon: Wilford Corner, MD;  Location: [place];  Service: Endoscopy;  Laterality: Left;   Patient Active Problem List   Diagnosis Date Noted   Pain due to onychomycosis of toenails of both feet 03/05/2022   LVH (left ventricular hypertrophy) 01/17/2022   Cirrhosis of liver without ascites ([place]) 09/18/2021   Hyperlipidemia associated with type 2 diabetes mellitus ([place]) 09/03/2021   Vaginal itching 09/03/2021   PAC (premature atrial contraction) 08/11/2020   Osteoporosis 05/19/2020   Osteoarthritis of right knee 05/04/2020   Overactive bladder 12/31/2019   Healthcare maintenance 12/31/2019   Dental caries 12/30/2019   Hypertension 11/21/2019   Hepatitis C virus infection cured after antiviral drug therapy 09/08/2019   Abnormal liver CT 08/27/2019   Type 2 diabetes mellitus ([place]) 08/27/2019   Hypoproteinemia ([place]) 08/27/2019    REFERRING DIAG: M81.0 (ICD-10-CM) - Age-related osteoporosis without current pathological fracture M47.9  (ICD-10-CM) - Osteoarthritis of lower bac  THERAPY DIAG:  No diagnosis found.  Rationale for Evaluation and Treatment Rehabilitation  PERTINENT HISTORY: HTN, DM2, LVH, arrhythmia, cirrhosis   PRECAUTIONS: osteoporosis, cardiac hx  SUBJECTIVE:                                                                                                                                                                                      SUBJECTIVE STATEMENT:  ***   PAIN:  Are you having pain: no Location: low back, bilateral along belt line How would you describe your pain? Sharp at its worst Best in past week: 0/10 Worst in past week: 6-7/10 Aggravating factors: housework, standing/walking, lower body dressing, prolonged sitting (~1 hr) Easing factors: repositioning, stretching   OBJECTIVE: (objective measures completed at initial evaluation unless otherwise dated)   DIAGNOSTIC FINDINGS:  None recently  PATIENT SURVEYS:  FOTO deferred given time constraints and family interpreting   SCREENING FOR RED FLAGS: Unremarkable red flag questioning   COGNITION: Overall cognitive status: Within functional limits for tasks assessed                          SENSATION: Light touch intact B LE   POSTURE: increased kyphosis, forward head, upper trap elevation, and reduced lordosis   PALPATION: Deferred on eval given time constraints   LUMBAR ROM:    AROM eval  Flexion 100% no pain, mid shin  Extension 75% pain in tailbone  Right lateral flexion    Left lateral flexion    Right rotation 50% p!  Left rotation 25% painless   (Blank rows = not tested) Comments: pain only during movement, does not persist after cessation of movement   LOWER EXTREMITY ROM:      Active  Right eval Left eval  Hip flexion      Hip extension      Hip internal rotation      Hip external rotation       (Blank rows = not tested)   Comments:     LOWER EXTREMITY MMT:     MMT Right eval Left eval   Hip flexion 4 3+  Hip abduction (modified sitting) 4+ 4+  Hip internal rotation 4 4  Hip external rotation 4 4  Knee flexion 4 4-  Knee extension 4 4-   (Blank rows = not tested)   Comments: painless throughout     FUNCTIONAL TESTS:  5xSTS: standard chair 21 sec no UE support, denies increase in pain   GAIT: Distance walked: within clinic Assistive device utilized: Single point cane Level of assistance: Modified independence Comments: partial step through pattern SPC in RUE, reduced gait speed and cadence   TODAY'S TREATMENT:                                                                                                    [facility] Adult PT Treatment:                                                DATE: 06/04/22 Therapeutic Exercise: *** Manual Therapy: *** Neuromuscular re-ed: *** Therapeutic Activity: *** Modalities: *** Self Care: [facility] Adult PT Treatment:                                                DATE: 05/07/22 Therapeutic Exercise: Adductor iso, seated w/ pillow squeeze x10 cues for form and HEP   PATIENT EDUCATION:  Education details: Pt education on PT impairments, prognosis, and POC.  Informed consent. Rationale for interventions, safe/appropriate HEP performance. Provided with HEP handout in [place], daughter states she will assist w/ translation Person educated: Patient and daughter Education method: Explanation, Demonstration, Tactile cues, Verbal cues, and Handouts Education comprehension: verbalized understanding, returned demonstration, verbal cues required, tactile cues required, and needs further education     HOME EXERCISE PROGRAM: Access Code: [email or web address] URL: [email or web address] Date: 05/07/2022 Prepared by: Enis Slipper   Exercises - Seated Hip Adduction Isometrics with Diona Foley  - 1 x daily - 7 x weekly - 3 sets - 10 reps   ASSESSMENT:   CLINICAL IMPRESSION: 05/31/2022 ***  Eval - Patient is a 71  y.o. woman who was seen today for physical therapy evaluation and treatment for low back pain and osteoporosis. Pt reports back pain for years with various fluctuations - able to perform majority of activities but does have pain/difficulty depending on symptom severity. Today pt demonstrates LLE weakness which she states is ongoing for last couple of years, as well as limitations in lumbar mobility. 5xSTS performed in 21sec which is indicative of fall risk (cutoff score 12-14sec varying by population). Pt tolerates examination and HEP well with no adverse events, denies any pain on departure. Recommend skilled PT to address aforementioned deficits and reduce fall risk. Pt departs today's session in no acute distress, all voiced questions/concerns addressed appropriately from PT perspective.     OBJECTIVE IMPAIRMENTS: Abnormal gait, decreased activity tolerance, decreased endurance, decreased mobility, difficulty walking, decreased ROM, decreased strength, postural dysfunction, and pain.    ACTIVITY LIMITATIONS: carrying, lifting, bending, sitting, standing, squatting, stairs, transfers, and locomotion level   PARTICIPATION LIMITATIONS: meal prep, cleaning, laundry, driving, shopping, and community activity   PERSONAL FACTORS: Age, Time since onset of injury/illness/exacerbation, and 3+ comorbidities: HTN, DM, cardiac hx, osteoporosis  are also affecting patient's functional outcome.    REHAB POTENTIAL: Good   CLINICAL DECISION MAKING: Stable/uncomplicated   EVALUATION COMPLEXITY: Low     GOALS: Goals reviewed with patient? No   SHORT TERM GOALS: Target date: 06/04/2022     Pt will demonstrate appropriate understanding and performance of initially prescribed HEP in order to facilitate improved independence with management of symptoms.  Baseline: HEP provided on eval Goal status: INITIAL      LONG TERM GOALS: Target date: 07/02/2022     Pt will demonstrate independence with/without  caregiver support for final HEP. Baseline: initial HEP prescribed Goal status: INITIAL   2.  Pt will demonstrate grossly symmetrical lumbar rotation ROM with less than 2/10 pain on NPS in order to demonstrate improved tolerance to functional mobility. Baseline: see ROM chart as above Goal status: INITIAL   3.  Pt will demonstrate grossly symmetrical hip MMT in order to demonstrate improved strength/mechanics for functional movements.  Baseline: see MMT chart above Goal status: INITIAL   4. Pt will perform 5xSTS in <13 sec in order to demonstrate reduced fall risk and improved functional independence. (MCID of 2.3sec)            Baseline: 21sec standard chair no UE support            Goal status: INITIAL    5. Pt will report ability to perform lower body dressing with less than 3/10 pain on NPS in order to demonstrate improved tolerance to ADLs.            Baseline: pain w/ lower body dressing up to 6-7/10            Goal status: INITIAL  PLAN:   PT FREQUENCY: 1-2x/week   PT DURATION: 8 weeks   PLANNED INTERVENTIONS: Therapeutic exercises, Therapeutic activity, Neuromuscular re-education, Balance training, Gait training, Patient/Family education, Self Care, Joint mobilization, Stair training, DME instructions, Aquatic Therapy, Dry Needling, Spinal mobilization, Cryotherapy, Moist heat, Taping, Manual therapy, and Re-evaluation.   PLAN FOR NEXT SESSION: *** Progress ROM/strengthening exercises as able/appropriate, review HEP.   Ashley Murrain PT, DPT 05/31/2022 9:15 AM

## 2022-06-04 ENCOUNTER — Ambulatory Visit: Payer: Medicare PPO | Admitting: Physical Therapy

## 2022-06-05 ENCOUNTER — Ambulatory Visit: Payer: Medicare Other | Admitting: Podiatry

## 2022-06-06 ENCOUNTER — Telehealth: Payer: Self-pay

## 2022-06-06 DIAGNOSIS — U071 COVID-19: Secondary | ICD-10-CM

## 2022-06-06 NOTE — Telephone Encounter (Signed)
Patients niece calls nurse line reporting positive Covid test last night.   She reports symptoms started on Monday. She reports congestion, cough and fevers. Fever is reduced with Tylenol.   Advised conservative measures. Warm teas with honey and OTC decongestants.   Discussed CDC guidelines.   They are requesting antiviral therapy if appropriate   ED precautions given.   Will forward to PCP.

## 2022-06-07 MED ORDER — NIRMATRELVIR/RITONAVIR (PAXLOVID)TABLET: 3.0000 | ORAL_TABLET | Freq: Two times a day (BID) | ORAL | 0 refills | Status: AC

## 2022-06-07 NOTE — Telephone Encounter (Signed)
Spoke with patient's niece who is spokesperson for patient has indicated in chart.  Discussed patient first became symptomatic on Monday and thus would qualify for Paxlovid as she is above the age of 61 has significant comorbidities and is within 5 days of symptoms.  Tested positive on Tuesday.  Reports patient is not having excess difficulty breathing but is coughing significantly with other viral URI symptoms including congestion.  Discussed that patient has 2 medications and possible interactions with Paxlovid including atorvastatin and fesoterodine.  Instructed to stop atorvastatin and fesoterodine while taking Paxlovid.  Instructed to resume atorvastatin and fesoterodine 3 days after completion of Paxlovid course.

## 2022-06-15 ENCOUNTER — Ambulatory Visit (INDEPENDENT_AMBULATORY_CARE_PROVIDER_SITE_OTHER): Payer: Medicare Other | Admitting: Podiatry

## 2022-06-15 ENCOUNTER — Encounter: Payer: Self-pay | Admitting: Podiatry

## 2022-06-15 DIAGNOSIS — B351 Tinea unguium: Secondary | ICD-10-CM

## 2022-06-15 DIAGNOSIS — M79674 Pain in right toe(s): Secondary | ICD-10-CM | POA: Diagnosis not present

## 2022-06-15 DIAGNOSIS — E119 Type 2 diabetes mellitus without complications: Secondary | ICD-10-CM | POA: Diagnosis not present

## 2022-06-15 DIAGNOSIS — M79675 Pain in left toe(s): Secondary | ICD-10-CM

## 2022-06-15 NOTE — Progress Notes (Signed)
This patient returns to my office for at risk foot care.  This patient requires this care by a professional since this patient will be at risk due to having type 2 diabetes.  This patient is unable to cut nails himself since the patient cannot reach his nails.These nails are painful walking and wearing shoes.  This patient presents for at risk foot care today.  General Appearance  Alert, conversant and in no acute stress.  Vascular  Dorsalis pedis and posterior tibial  pulses are palpable  bilaterally.  Capillary return is within normal limits  bilaterally. Temperature is within normal limits  bilaterally.  Neurologic  Senn-Weinstein monofilament wire test within normal limits  bilaterally. Muscle power within normal limits bilaterally.  Nails Thick disfigured discolored nails with subungual debris  from hallux to fifth toes bilaterally. No evidence of bacterial infection or drainage bilaterally.  Orthopedic  No limitations of motion  feet .  No crepitus or effusions noted.  No bony pathology or digital deformities noted.  Skin  normotropic skin with no porokeratosis noted bilaterally.  No signs of infections or ulcers noted.     Onychomycosis  Pain in right toes  Pain in left toes  Consent was obtained for treatment procedures.   Mechanical debridement of nails 1-5  bilaterally performed with a nail nipper.  Filed with dremel without incident.    Return office visit    3 months                  Told patient to return for periodic foot care and evaluation due to potential at risk complications.   Platon Arocho DPM   

## 2022-06-19 ENCOUNTER — Ambulatory Visit: Payer: Medicare PPO | Attending: Family Medicine | Admitting: Physical Therapy

## 2022-06-19 ENCOUNTER — Encounter: Payer: Self-pay | Admitting: Physical Therapy

## 2022-06-19 DIAGNOSIS — M6281 Muscle weakness (generalized): Secondary | ICD-10-CM | POA: Diagnosis present

## 2022-06-19 DIAGNOSIS — M5459 Other low back pain: Secondary | ICD-10-CM | POA: Diagnosis not present

## 2022-06-19 DIAGNOSIS — R2689 Other abnormalities of gait and mobility: Secondary | ICD-10-CM | POA: Insufficient documentation

## 2022-06-19 NOTE — Therapy (Addendum)
OUTPATIENT PHYSICAL THERAPY RECERTIFICATION + TREATMENT NOTE   Patient Name: Tracey Riddle MRN: [id number] DOB:08/17/1950, 72 y.o., female Today's Date: 06/19/2022  PCP: Salvadore Oxford, MD   REFERRING PROVIDER: McDiarmid, Blane Ohara, MD  END OF SESSION:   PT End of Session - 06/19/22 1636     Visit Number 2    Number of Visits 6   1-2x/week   Date for PT Re-Evaluation 08/14/22    Authorization Type Medicare AB    Authorization Time Period kx mod v15    Progress Note Due on Visit 10    PT Start Time 1635    PT Stop Time 1713    PT Time Calculation (min) 38 min    Activity Tolerance Patient tolerated treatment well;No increased pain    Behavior During Therapy [facility] for tasks assessed/performed             Past Medical History:  Diagnosis Date   Abnormal liver CT 08/27/2019   CT AP (08/26/19, [facility]): Mildly nodular hepatic contour c/w possible mild cirrhosis.   Diabetes mellitus type II, controlled ([place]) 08/27/2019   Dyspnea 09/03/2021   Hepatitis C    Past Surgical History:  Procedure Laterality Date   BIOPSY  09/07/2019   Procedure: BIOPSY;  Surgeon: Wilford Corner, MD;  Location: [place];  Service: Endoscopy;;   ESOPHAGEAL BRUSHING  09/07/2019   Procedure: ESOPHAGEAL BRUSHING;  Surgeon: Wilford Corner, MD;  Location: [place];  Service: Endoscopy;;   ESOPHAGOGASTRODUODENOSCOPY (EGD) WITH PROPOFOL Left 09/07/2019   Procedure: ESOPHAGOGASTRODUODENOSCOPY (EGD) WITH PROPOFOL;  Surgeon: Wilford Corner, MD;  Location: [place];  Service: Endoscopy;  Laterality: Left;   Patient Active Problem List   Diagnosis Date Noted   Pain due to onychomycosis of toenails of both feet 03/05/2022   LVH (left ventricular hypertrophy) 01/17/2022   Cirrhosis of liver without ascites ([place]) 09/18/2021   Hyperlipidemia associated with type 2 diabetes mellitus ([place]) 09/03/2021   Vaginal itching 09/03/2021   PAC (premature atrial contraction) 08/11/2020   Osteoporosis  05/19/2020   Osteoarthritis of right knee 05/04/2020   Overactive bladder 12/31/2019   Healthcare maintenance 12/31/2019   Dental caries 12/30/2019   Hypertension 11/21/2019   Hepatitis C virus infection cured after antiviral drug therapy 09/08/2019   Abnormal liver CT 08/27/2019   Type 2 diabetes mellitus ([place]) 08/27/2019   Hypoproteinemia ([place]) 08/27/2019    REFERRING DIAG: M81.0 (ICD-10-CM) - Age-related osteoporosis without current pathological fracture M47.9 (ICD-10-CM) - Osteoarthritis of lower bac  THERAPY DIAG:  Other low back pain  Other abnormalities of gait and mobility  Muscle weakness (generalized)  Rationale for Evaluation and Treatment Rehabilitation  PERTINENT HISTORY: HTN, DM2, LVH, arrhythmia, cirrhosis   PRECAUTIONS: osteoporosis, cardiac hx  SUBJECTIVE:  SUBJECTIVE STATEMENT:   Pt/family politely decline interpreter services on this date in favor of family translation which is respected. Pt states pain has improved notably since initial evaluation, although continues to have pain up to 5-6/10 mostly in the mornings. Improves with stretching and gets better throughout the day. No other new updates per pt  PAIN:  Are you having pain: no Location: low back, bilateral along belt line How would you describe your pain? Sharp at its worst Best in past week: 0/10 Worst in past week: 5-6/10 Aggravating factors: housework, standing/walking, lower body dressing, prolonged sitting (~1 hr) Easing factors: repositioning, stretching   OBJECTIVE: (objective measures completed at initial evaluation unless otherwise dated)   DIAGNOSTIC FINDINGS:  None recently   PATIENT SURVEYS:  FOTO deferred given time constraints and family interpreting   SCREENING FOR RED FLAGS: Unremarkable red  flag questioning   COGNITION: Overall cognitive status: Within functional limits for tasks assessed                          SENSATION: Light touch intact B LE   POSTURE: increased kyphosis, forward head, upper trap elevation, and reduced lordosis   PALPATION: Deferred on eval given time constraints   LUMBAR ROM:    AROM eval 06/19/22  Flexion 100% no pain, mid shin 100% to ankle, no pain  Extension 75% pain in tailbone 75% QL pain   Right lateral flexion     Left lateral flexion     Right rotation 50% p! 75% no pain  Left rotation 25% painless 75% no pain    (Blank rows = not tested) Comments: pain only during movement, does not persist after cessation of movement   LOWER EXTREMITY ROM:      Active  Right eval Left eval  Hip flexion      Hip extension      Hip internal rotation      Hip external rotation       (Blank rows = not tested)   Comments:     LOWER EXTREMITY MMT:     MMT Right eval Left eval R/L 06/19/22  Hip flexion 4 3+ 4-/4  Hip abduction (modified sitting) 4+ 4+ 5/5  Hip internal rotation 4 4 4/4  Hip external rotation 4 4 4/4  Knee flexion 4 4- 4/4  Knee extension 4 4- 4/4   (Blank rows = not tested)   Comments: painless throughout     FUNCTIONAL TESTS:  5xSTS: standard chair 21 sec no UE support, denies increase in pain 06/19/22: 5xSTS 17 sec no UE support standard chair, no pain    GAIT: Distance walked: within clinic Assistive device utilized: Single point cane Level of assistance: Modified independence Comments: partial step through pattern SPC in RUE, reduced gait speed and cadence   TODAY'S TREATMENT:                                                                                                    [facility] Adult PT Treatment:  DATE: 06/19/22 Therapeutic Exercise: Standing swiss ball press down 2x12 cues for form and breath control Adductor iso seated w/ ball 2x15 cues for form and breath control   Standing hip kickback 45 deg at counter 2x10 each LE cues for posture and reduced trunk lean Standing heel raises at counter 2x10 cues for form and pacing   Therapeutic Activity: 5xSTS + education 2x5 STS from standard chair for improved transfers Lumbar assessment + education Activity modification and safety w/ activity at home                         [facility] Adult PT Treatment:                                                DATE: 05/07/22 Therapeutic Exercise: Adductor iso, seated w/ pillow squeeze x10 cues for form and HEP   PATIENT EDUCATION:  Education details: rationale for interventions, HEP update Person educated: Patient and daughter Education method: Explanation, Demonstration, Tactile cues, Verbal cues, and Handouts Education comprehension: verbalized understanding, returned demonstration, verbal cues required, tactile cues required, and needs further education     HOME EXERCISE PROGRAM: Access Code: [email or web address] URL: [email or web address] Date: 06/19/2022 Prepared by: Enis Slipper  Exercises - Seated Hip Adduction Isometrics with Diona Foley  - 1 x daily - 7 x weekly - 3 sets - 10 reps - Sit to Stand with Armchair  - 1 x daily - 7 x weekly - 3 sets - 5 reps - Standing Hip Extension with Counter Support  - 1 x daily - 7 x weekly - 2 sets - 10 reps - Heel Raises with Counter Support  - 1 x daily - 7 x weekly - 2 sets - 10 reps   ASSESSMENT:   CLINICAL IMPRESSION: 06/19/2022 Pt arrives w/o significant pain, reports doing well since initial evaluation but does have increased pain particularly in mornings. Re-checked measurements from initial evaluation due to gap in care (pt reports transportation issues and scheduling conflicts), pt does have improvement compared to initial eval but does still have mild LE weakness, concordant pain with lumbar extension. Discussion w/ pt and daughter, plan for PT trial to establish exercise program and independent management of symptoms,  updated POC as below. Pt tolerates session well without adverse event or increase in pain, HEP updated as above. Pt departs today's session in no acute distress, all voiced questions/concerns addressed appropriately from PT perspective.    Eval - Patient is a 72 y.o. woman who was seen today for physical therapy evaluation and treatment for low back pain and osteoporosis. Pt reports back pain for years with various fluctuations - able to perform majority of activities but does have pain/difficulty depending on symptom severity. Today pt demonstrates LLE weakness which she states is ongoing for last couple of years, as well as limitations in lumbar mobility. 5xSTS performed in 21sec which is indicative of fall risk (cutoff score 12-14sec varying by population). Pt tolerates examination and HEP well with no adverse events, denies any pain on departure. Recommend skilled PT to address aforementioned deficits and reduce fall risk. Pt departs today's session in no acute distress, all voiced questions/concerns addressed appropriately from PT perspective.     OBJECTIVE IMPAIRMENTS: Abnormal gait, decreased activity tolerance, decreased endurance, decreased mobility, difficulty walking, decreased ROM, decreased strength, postural dysfunction, and pain.  ACTIVITY LIMITATIONS: carrying, lifting, bending, sitting, standing, squatting, stairs, transfers, and locomotion level   PARTICIPATION LIMITATIONS: meal prep, cleaning, laundry, driving, shopping, and community activity   PERSONAL FACTORS: Age, Time since onset of injury/illness/exacerbation, and 3+ comorbidities: HTN, DM, cardiac hx, osteoporosis  are also affecting patient's functional outcome.    REHAB POTENTIAL: Good   CLINICAL DECISION MAKING: Stable/uncomplicated   EVALUATION COMPLEXITY: Low     GOALS: Goals reviewed with patient? No   SHORT TERM GOALS: Target date: 06/04/2022     Pt will demonstrate appropriate understanding and performance  of initially prescribed HEP in order to facilitate improved independence with management of symptoms.  Baseline: HEP provided on eval Goal status: INITIAL      LONG TERM GOALS: Target date: 07/20/22     Pt will demonstrate independence with/without caregiver support for final HEP. Baseline: initial HEP prescribed Goal status: ONGOING   2.  Pt will demonstrate grossly symmetrical lumbar rotation ROM with less than 2/10 pain on NPS in order to demonstrate improved tolerance to functional mobility. Baseline: see ROM chart as above Goal status: MOSTLY MET   3.  Pt will demonstrate grossly symmetrical hip MMT in order to demonstrate improved strength/mechanics for functional movements.  Baseline: see MMT chart above Goal status: ONGOING   4. Pt will perform 5xSTS in <13 sec in order to demonstrate reduced fall risk and improved functional independence. (MCID of 2.3sec)            Baseline: 21sec standard chair no UE support  06/19/22: 15sec standard chair no UE support            Goal status: PROGRESSING   5. Pt will report ability to perform lower body dressing with less than 3/10 pain on NPS in order to demonstrate improved tolerance to ADLs.            Baseline: pain w/ lower body dressing up to 6-7/10            Goal status: ONGOING   PLAN (updated 06/19/22):   PT FREQUENCY: 1-2x/week   PT DURATION: 4 weeks   PLANNED INTERVENTIONS: Therapeutic exercises, Therapeutic activity, Neuromuscular re-education, Balance training, Gait training, Patient/Family education, Self Care, Joint mobilization, Stair training, DME instructions, Aquatic Therapy, Dry Needling, Spinal mobilization, Cryotherapy, Moist heat, Taping, Manual therapy, and Re-evaluation.   PLAN FOR NEXT SESSION:  Progress ROM/strengthening exercises as able/appropriate, review HEP.   Leeroy Cha PT, DPT 06/19/2022 5:32 PM

## 2022-06-22 NOTE — Therapy (Signed)
OUTPATIENT PHYSICAL THERAPY TREATMENT NOTE   Patient Name: Tracey Riddle MRN: [id number] DOB:08/01/1950, 72 y.o., female Today's Date: 06/25/2022  PCP: Salvadore Oxford, MD   REFERRING PROVIDER: McDiarmid, Blane Ohara, MD  END OF SESSION:   PT End of Session - 06/25/22 1553     Visit Number 3    Number of Visits 6   1-2x/week   Date for PT Re-Evaluation 08/14/22    Authorization Type Medicare AB    Authorization Time Period kx mod v15    Progress Note Due on Visit 10    PT Start Time 6503   pt late arrival   PT Stop Time 1629    PT Time Calculation (min) 35 min    Activity Tolerance Patient tolerated treatment well;No increased pain    Behavior During Therapy [facility] for tasks assessed/performed              Past Medical History:  Diagnosis Date   Abnormal liver CT 08/27/2019   CT AP (08/26/19, [facility]): Mildly nodular hepatic contour c/w possible mild cirrhosis.   Diabetes mellitus type II, controlled ([place]) 08/27/2019   Dyspnea 09/03/2021   Hepatitis C    Past Surgical History:  Procedure Laterality Date   BIOPSY  09/07/2019   Procedure: BIOPSY;  Surgeon: Wilford Corner, MD;  Location: [place];  Service: Endoscopy;;   ESOPHAGEAL BRUSHING  09/07/2019   Procedure: ESOPHAGEAL BRUSHING;  Surgeon: Wilford Corner, MD;  Location: [place];  Service: Endoscopy;;   ESOPHAGOGASTRODUODENOSCOPY (EGD) WITH PROPOFOL Left 09/07/2019   Procedure: ESOPHAGOGASTRODUODENOSCOPY (EGD) WITH PROPOFOL;  Surgeon: Wilford Corner, MD;  Location: [place];  Service: Endoscopy;  Laterality: Left;   Patient Active Problem List   Diagnosis Date Noted   Pain due to onychomycosis of toenails of both feet 03/05/2022   LVH (left ventricular hypertrophy) 01/17/2022   Cirrhosis of liver without ascites ([place]) 09/18/2021   Hyperlipidemia associated with type 2 diabetes mellitus ([place]) 09/03/2021   Vaginal itching 09/03/2021   PAC (premature atrial contraction) 08/11/2020   Osteoporosis  05/19/2020   Osteoarthritis of right knee 05/04/2020   Overactive bladder 12/31/2019   Healthcare maintenance 12/31/2019   Dental caries 12/30/2019   Hypertension 11/21/2019   Hepatitis C virus infection cured after antiviral drug therapy 09/08/2019   Abnormal liver CT 08/27/2019   Type 2 diabetes mellitus ([place]) 08/27/2019   Hypoproteinemia ([place]) 08/27/2019    REFERRING DIAG: M81.0 (ICD-10-CM) - Age-related osteoporosis without current pathological fracture M47.9 (ICD-10-CM) - Osteoarthritis of lower bac  THERAPY DIAG:  Other low back pain  Other abnormalities of gait and mobility  Muscle weakness (generalized)  Rationale for Evaluation and Treatment Rehabilitation  PERTINENT HISTORY: HTN, DM2, LVH, arrhythmia, cirrhosis   PRECAUTIONS: osteoporosis, cardiac hx  SUBJECTIVE:  SUBJECTIVE STATEMENT:    Pt/family politely decline interpreter services on this date in favor of family translation which is respected. Pt reports good HEP adherence, denies any issues after last session. States exercises have been helping her with her pain in the morning.   PAIN:  Are you having pain: no Location: low back, bilateral along belt line How would you describe your pain? Sharp at its worst Best in past week: 0/10 Worst in past week: 5/10 Aggravating factors: housework, standing/walking, lower body dressing, prolonged sitting (~1 hr) Easing factors: repositioning, stretching   OBJECTIVE: (objective measures completed at initial evaluation unless otherwise dated)   DIAGNOSTIC FINDINGS:  None recently   PATIENT SURVEYS:  FOTO deferred given time constraints and family interpreting   SCREENING FOR RED FLAGS: Unremarkable red flag questioning   COGNITION: Overall cognitive status: Within functional limits  for tasks assessed                          SENSATION: Light touch intact B LE   POSTURE: increased kyphosis, forward head, upper trap elevation, and reduced lordosis   PALPATION: Deferred on eval given time constraints   LUMBAR ROM:    AROM eval 06/19/22  Flexion 100% no pain, mid shin 100% to ankle, no pain  Extension 75% pain in tailbone 75% QL pain   Right lateral flexion     Left lateral flexion     Right rotation 50% p! 75% no pain  Left rotation 25% painless 75% no pain    (Blank rows = not tested) Comments: pain only during movement, does not persist after cessation of movement   LOWER EXTREMITY ROM:      Active  Right eval Left eval  Hip flexion      Hip extension      Hip internal rotation      Hip external rotation       (Blank rows = not tested)   Comments:     LOWER EXTREMITY MMT:     MMT Right eval Left eval R/L 06/19/22  Hip flexion 4 3+ 4-/4  Hip abduction (modified sitting) 4+ 4+ 5/5  Hip internal rotation 4 4 4/4  Hip external rotation 4 4 4/4  Knee flexion 4 4- 4/4  Knee extension 4 4- 4/4   (Blank rows = not tested)   Comments: painless throughout     FUNCTIONAL TESTS:  5xSTS: standard chair 21 sec no UE support, denies increase in pain 06/19/22: 5xSTS 17 sec no UE support standard chair, no pain    GAIT: Distance walked: within clinic Assistive device utilized: Single point cane Level of assistance: Modified independence Comments: partial step through pattern SPC in RUE, reduced gait speed and cadence   TODAY'S TREATMENT:                                                                                                [facility] Adult PT Treatment:  DATE: 06/25/22 Therapeutic Exercise: Standing swiss ball press down 2x15 cues for form and breath control, reduced compensations at shoulder Sit to stand 2x8 cues for form and pacing, appropriate width of BOS RTB 45 deg kickbacks at counter 2x10 cues for  form and posture  Standing heel raises 2x15 cues for form and posture  Sidestepping 3 laps along counter cues for foot positioning and posture    [facility] Adult PT Treatment:                                                DATE: 06/19/22 Therapeutic Exercise: Standing swiss ball press down 2x12 cues for form and breath control Adductor iso seated w/ ball 2x15 cues for form and breath control  Standing hip kickback 45 deg at counter 2x10 each LE cues for posture and reduced trunk lean Standing heel raises at counter 2x10 cues for form and pacing   Therapeutic Activity: 5xSTS + education 2x5 STS from standard chair for improved transfers Lumbar assessment + education Activity modification and safety w/ activity at home     PATIENT EDUCATION:  Education details: rationale for interventions, HEP update Person educated: Patient and daughter Education method: Explanation, Demonstration, Tactile cues, Verbal cues, and Handouts (states family will assist w/ translation for HEP) Education comprehension: verbalized understanding, returned demonstration, verbal cues required, tactile cues required, and needs further education     HOME EXERCISE PROGRAM: Access Code: [email or web address] URL: [email or web address] Date: 06/25/2022 Prepared by: Fransisco Hertz  Exercises - Seated Hip Adduction Isometrics with Newman Pies  - 1 x daily - 7 x weekly - 3 sets - 10 reps - Sit to Stand with Armchair  - 1 x daily - 7 x weekly - 2 sets - 8 reps - Heel Raises with Counter Support  - 1 x daily - 7 x weekly - 2 sets - 15 reps - Standing Hip Extension Kicks  - 1 x daily - 7 x weekly - 2 sets - 10 reps - Side Stepping with Counter Support  - 1 x daily - 7 x weekly - 2 sets - 10 reps   ASSESSMENT:   CLINICAL IMPRESSION: 06/25/2022 Pt arrives w/o significant pain, states she continues to do well overall, notes exercises have been helping her morning pain. Today progressed familiar exercises for volume/intensity where  appropriate, emphasis placed on appropriate form and managing setup for home performance. Pt tolerates session well without any pain or adverse events, appreciate family member assist for translation. HEP updated, handout provided. Pt departs today's session in no acute distress, all voiced questions/concerns addressed appropriately from PT perspective.     Eval - Patient is a 72 y.o. woman who was seen today for physical therapy evaluation and treatment for low back pain and osteoporosis. Pt reports back pain for years with various fluctuations - able to perform majority of activities but does have pain/difficulty depending on symptom severity. Today pt demonstrates LLE weakness which she states is ongoing for last couple of years, as well as limitations in lumbar mobility. 5xSTS performed in 21sec which is indicative of fall risk (cutoff score 12-14sec varying by population). Pt tolerates examination and HEP well with no adverse events, denies any pain on departure. Recommend skilled PT to address aforementioned deficits and reduce fall risk. Pt departs today's session in no acute distress, all voiced questions/concerns addressed appropriately from PT perspective.  OBJECTIVE IMPAIRMENTS: Abnormal gait, decreased activity tolerance, decreased endurance, decreased mobility, difficulty walking, decreased ROM, decreased strength, postural dysfunction, and pain.    ACTIVITY LIMITATIONS: carrying, lifting, bending, sitting, standing, squatting, stairs, transfers, and locomotion level   PARTICIPATION LIMITATIONS: meal prep, cleaning, laundry, driving, shopping, and community activity   PERSONAL FACTORS: Age, Time since onset of injury/illness/exacerbation, and 3+ comorbidities: HTN, DM, cardiac hx, osteoporosis  are also affecting patient's functional outcome.    REHAB POTENTIAL: Good   CLINICAL DECISION MAKING: Stable/uncomplicated   EVALUATION COMPLEXITY: Low     GOALS: Goals reviewed with  patient? No   SHORT TERM GOALS: Target date: 06/04/2022     Pt will demonstrate appropriate understanding and performance of initially prescribed HEP in order to facilitate improved independence with management of symptoms.  Baseline: HEP provided on eval Goal status: INITIAL      LONG TERM GOALS: Target date: 07/20/22     Pt will demonstrate independence with/without caregiver support for final HEP. Baseline: initial HEP prescribed Goal status: ONGOING   2.  Pt will demonstrate grossly symmetrical lumbar rotation ROM with less than 2/10 pain on NPS in order to demonstrate improved tolerance to functional mobility. Baseline: see ROM chart as above Goal status: MOSTLY MET   3.  Pt will demonstrate grossly symmetrical hip MMT in order to demonstrate improved strength/mechanics for functional movements.  Baseline: see MMT chart above Goal status: ONGOING   4. Pt will perform 5xSTS in <13 sec in order to demonstrate reduced fall risk and improved functional independence. (MCID of 2.3sec)            Baseline: 21sec standard chair no UE support  06/19/22: 15sec standard chair no UE support            Goal status: PROGRESSING   5. Pt will report ability to perform lower body dressing with less than 3/10 pain on NPS in order to demonstrate improved tolerance to ADLs.            Baseline: pain w/ lower body dressing up to 6-7/10            Goal status: ONGOING   PLAN (updated 06/19/22):   PT FREQUENCY: 1-2x/week   PT DURATION: 4 weeks   PLANNED INTERVENTIONS: Therapeutic exercises, Therapeutic activity, Neuromuscular re-education, Balance training, Gait training, Patient/Family education, Self Care, Joint mobilization, Stair training, DME instructions, Aquatic Therapy, Dry Needling, Spinal mobilization, Cryotherapy, Moist heat, Taping, Manual therapy, and Re-evaluation.   PLAN FOR NEXT SESSION: likely discharge if continues along current trajectory, review/update HEP   Ashley Murrain  PT, DPT 06/25/2022 4:48 PM

## 2022-06-25 ENCOUNTER — Encounter: Payer: Self-pay | Admitting: Physical Therapy

## 2022-06-25 ENCOUNTER — Ambulatory Visit: Payer: Medicare PPO | Admitting: Physical Therapy

## 2022-06-25 DIAGNOSIS — R2689 Other abnormalities of gait and mobility: Secondary | ICD-10-CM

## 2022-06-25 DIAGNOSIS — M5459 Other low back pain: Secondary | ICD-10-CM

## 2022-06-25 DIAGNOSIS — M6281 Muscle weakness (generalized): Secondary | ICD-10-CM

## 2022-07-06 ENCOUNTER — Encounter: Payer: Self-pay | Admitting: Physical Therapy

## 2022-07-06 ENCOUNTER — Ambulatory Visit: Payer: Medicare PPO | Admitting: Physical Therapy

## 2022-07-06 DIAGNOSIS — M5459 Other low back pain: Secondary | ICD-10-CM | POA: Diagnosis not present

## 2022-07-06 DIAGNOSIS — R2689 Other abnormalities of gait and mobility: Secondary | ICD-10-CM

## 2022-07-06 DIAGNOSIS — M6281 Muscle weakness (generalized): Secondary | ICD-10-CM

## 2022-07-06 NOTE — Therapy (Signed)
OUTPATIENT PHYSICAL THERAPY TREATMENT NOTE + DISCHARGE   Patient Name: Tracey Riddle MRN: [id number] DOB:[birth year], 72 y.o., female Today's Date: 07/06/2022   PHYSICAL THERAPY DISCHARGE SUMMARY  Visits from Start of Care: 4  Current functional level related to goals / functional outcomes: Able to perform typical ADLs/mobility w/o restriction, no symptoms over past week, good adherence to HEP reported   Remaining deficits: See below   Education / Equipment: HEP, safety w/ activity/mobility, goals assessment, discharge education   Patient agrees to discharge. Patient goals were met. Patient is being discharged due to being pleased with the current functional level, meeting goals.  PCP: Salvadore Oxford, MD   REFERRING PROVIDER: McDiarmid, Blane Ohara, MD  END OF SESSION:   PT End of Session - 07/06/22 1407     Visit Number 4    Number of Visits 6    Date for PT Re-Evaluation 08/14/22    Authorization Type Medicare AB    Authorization Time Period kx mod v15    Progress Note Due on Visit 10    PT Start Time 1407   pt late arrival   PT Stop Time 1435    PT Time Calculation (min) 28 min    Activity Tolerance Patient tolerated treatment well;No increased pain    Behavior During Therapy [facility] for tasks assessed/performed               Past Medical History:  Diagnosis Date   Abnormal liver CT 08/27/2019   CT AP (08/[facility]/19, [facility]): Mildly nodular hepatic contour c/w possible mild cirrhosis.   Diabetes mellitus type II, controlled ([place]) 08/27/2019   Dyspnea 09/03/2021   Hepatitis C    Past Surgical History:  Procedure Laterality Date   BIOPSY  09/07/2019   Procedure: BIOPSY;  Surgeon: Wilford Corner, MD;  Location: [place];  Service: Endoscopy;;   ESOPHAGEAL BRUSHING  09/07/2019   Procedure: ESOPHAGEAL BRUSHING;  Surgeon: Wilford Corner, MD;  Location: [place];  Service: Endoscopy;;   ESOPHAGOGASTRODUODENOSCOPY (EGD) WITH PROPOFOL Left 09/07/2019    Procedure: ESOPHAGOGASTRODUODENOSCOPY (EGD) WITH PROPOFOL;  Surgeon: Wilford Corner, MD;  Location: [place];  Service: Endoscopy;  Laterality: Left;   Patient Active Problem List   Diagnosis Date Noted   Pain due to onychomycosis of toenails of both feet 03/05/2022   LVH (left ventricular hypertrophy) 01/17/2022   Cirrhosis of liver without ascites ([place]) 09/18/2021   Hyperlipidemia associated with type 2 diabetes mellitus ([place]) 09/03/2021   Vaginal itching 09/03/2021   PAC (premature atrial contraction) 08/11/2020   Osteoporosis 05/19/2020   Osteoarthritis of right knee 05/04/2020   Overactive bladder 12/31/2019   Healthcare maintenance 12/31/2019   Dental caries 12/30/2019   Hypertension 11/21/2019   Hepatitis C virus infection cured after antiviral drug therapy 09/08/2019   Abnormal liver CT 08/27/2019   Type 2 diabetes mellitus ([place]) 08/27/2019   Hypoproteinemia ([place]) 08/27/2019    REFERRING DIAG: M81.0 (ICD-10-CM) - Age-related osteoporosis without current pathological fracture M47.9 (ICD-10-CM) - Osteoarthritis of lower bac  THERAPY DIAG:  Other low back pain  Other abnormalities of gait and mobility  Muscle weakness (generalized)  Rationale for Evaluation and Treatment Rehabilitation  PERTINENT HISTORY: HTN, DM2, LVH, arrhythmia, cirrhosis   PRECAUTIONS: osteoporosis, cardiac hx  SUBJECTIVE:  SUBJECTIVE STATEMENT:    Pt arrives w/o pain, states she has been doing well, feels ready to discharge. Denies any back pain in back over last week, no limitations due to back. States she is very pleased with her progress. Politely declines video interpreter services, appreciate assistance of family member   PAIN:  Are you having pain: none in past week   OBJECTIVE: (objective measures  completed at initial evaluation unless otherwise dated)   DIAGNOSTIC FINDINGS:  None recently   PATIENT SURVEYS:  FOTO deferred given time constraints and family interpreting   SCREENING FOR RED FLAGS: Unremarkable red flag questioning   COGNITION: Overall cognitive status: Within functional limits for tasks assessed                          SENSATION: Light touch intact B LE   POSTURE: increased kyphosis, forward head, upper trap elevation, and reduced lordosis   PALPATION: Deferred on eval given time constraints   LUMBAR ROM:    AROM eval 06/19/22 07/06/22  Flexion 100% no pain, mid shin 100% to ankle, no pain 100%   Extension 75% pain in tailbone 75% QL pain  75% no pain   Right lateral flexion      Left lateral flexion      Right rotation 50% p! 75% no pain 75% painless  Left rotation 25% painless 75% no pain  75% painless   (Blank rows = not tested) Comments: pain only during movement, does not persist after cessation of movement   LOWER EXTREMITY ROM:      Active  Right eval Left eval  Hip flexion      Hip extension      Hip internal rotation      Hip external rotation       (Blank rows = not tested)   Comments:     LOWER EXTREMITY MMT:     MMT Right eval Left eval R/L 06/19/22 R/L 07/06/22   Hip flexion 4 3+ 4-/4 4+/4+  Hip abduction (modified sitting) 4+ 4+ 5/5   Hip internal rotation 4 4 4/4 5/5  Hip external rotation 4 4 4/4 4/4  Knee flexion 4 4- 4/4 5/5  Knee extension 4 4- 4/4 5/5   (Blank rows = not tested)   Comments: painless throughout     FUNCTIONAL TESTS:  5xSTS: standard chair 21 sec no UE support, denies increase in pain 06/19/22: 5xSTS 17 sec no UE support standard chair, no pain  07/06/2022 13sec no UE support standard chair no pain    GAIT: Distance walked: within clinic Assistive device utilized: Single point cane Level of assistance: Modified independence Comments: partial step through pattern SPC in RUE, reduced gait speed and  cadence   TODAY'S TREATMENT:                                                                                                 [facility] Adult PT Treatment:  DATE: 07/06/22 Therapeutic Exercise: Seated adductor iso 2x10 education for HEP  STS x5 (separate from 5xSTS) Heel raises 2x15  Hip 45 deg kickbacks x10 B Side stepping along counter 3 laps  Therapeutic Activity: 5xSTS + education MSK assessment + education Discharge education    [facility] Adult PT Treatment:                                                DATE: 06/25/22 Therapeutic Exercise: Standing swiss ball press down 2x15 cues for form and breath control, reduced compensations at shoulder Sit to stand 2x8 cues for form and pacing, appropriate width of BOS RTB 45 deg kickbacks at counter 2x10 cues for form and posture  Standing heel raises 2x15 cues for form and posture  Sidestepping 3 laps along counter cues for foot positioning and posture    [facility] Adult PT Treatment:                                                DATE: 06/19/22 Therapeutic Exercise: Standing swiss ball press down 2x12 cues for form and breath control Adductor iso seated w/ ball 2x15 cues for form and breath control  Standing hip kickback 45 deg at counter 2x10 each LE cues for posture and reduced trunk lean Standing heel raises at counter 2x10 cues for form and pacing   Therapeutic Activity: 5xSTS + education 2x5 STS from standard chair for improved transfers Lumbar assessment + education Activity modification and safety w/ activity at home     PATIENT EDUCATION:  Education details: rationale for interventions, HEP update/review and discharge education, following up with provider Person educated: Patient and daughter Education method: Explanation, Demonstration, Tactile cues, Verbal cues Education comprehension: verbalized understanding, returned demonstration, verbal cues required, tactile cues required, and needs  further education     HOME EXERCISE PROGRAM: Access Code: [email or web address] URL: [email or web address] Date: 06/25/2022 Prepared by: Enis Slipper  Exercises - Seated Hip Adduction Isometrics with Diona Foley  - 1 x daily - 7 x weekly - 3 sets - 10 reps - Sit to Stand with Armchair  - 1 x daily - 7 x weekly - 2 sets - 8 reps - Heel Raises with Counter Support  - 1 x daily - 7 x weekly - 2 sets - 15 reps - Standing Hip Extension Kicks  - 1 x daily - 7 x weekly - 2 sets - 10 reps - Side Stepping with Counter Support  - 1 x daily - 7 x weekly - 2 sets - 10 reps   ASSESSMENT:   CLINICAL IMPRESSION: 07/06/2022 Pt arrives w/o any pain, denies any pain or limitation since last visit, states she feels ready for discharge. Pt has done quite well with PT overall, continues with mild limitations in lumbar mobility but painless and not affecting function. LTGs addressed as below. 5xSTS improved to 13sec compared to 21sec on initial eval which indicates significantly reduced fall risk (cutoff score for fall risk in community dwelling elderly <15sec per [place], 2010). Pt denies any concerns at this time, receptive to discharge education, encouraged to follow up with provider as needed. HEP performed without issue in clinic today, pt/family decline need for new handout. No adverse events, pt departs today's session  in no acute distress, all voiced questions/concerns addressed appropriately from PT perspective.      Eval - Patient is a 72 y.o. woman who was seen today for physical therapy evaluation and treatment for low back pain and osteoporosis. Pt reports back pain for years with various fluctuations - able to perform majority of activities but does have pain/difficulty depending on symptom severity. Today pt demonstrates LLE weakness which she states is ongoing for last couple of years, as well as limitations in lumbar mobility. 5xSTS performed in 21sec which is indicative of fall risk (cutoff score 12-14sec  varying by population). Pt tolerates examination and HEP well with no adverse events, denies any pain on departure. Recommend skilled PT to address aforementioned deficits and reduce fall risk. Pt departs today's session in no acute distress, all voiced questions/concerns addressed appropriately from PT perspective.     OBJECTIVE IMPAIRMENTS: Abnormal gait, decreased activity tolerance, decreased endurance, decreased mobility, difficulty walking, decreased ROM, decreased strength, postural dysfunction, and pain.    ACTIVITY LIMITATIONS: carrying, lifting, bending, sitting, standing, squatting, stairs, transfers, and locomotion level   PARTICIPATION LIMITATIONS: meal prep, cleaning, laundry, driving, shopping, and community activity   PERSONAL FACTORS: Age, Time since onset of injury/illness/exacerbation, and 3+ comorbidities: HTN, DM, cardiac hx, osteoporosis  are also affecting patient's functional outcome.    REHAB POTENTIAL: Good   CLINICAL DECISION MAKING: Stable/uncomplicated   EVALUATION COMPLEXITY: Low     GOALS: Goals reviewed with patient? No   SHORT TERM GOALS: Target date: 06/04/2022     Pt will demonstrate appropriate understanding and performance of initially prescribed HEP in order to facilitate improved independence with management of symptoms.  Baseline: HEP provided on eval 07/06/2022 independent w HEP  Goal status: MET     LONG TERM GOALS: Target date: 07/20/22     Pt will demonstrate independence with/without caregiver support for final HEP. Baseline: initial HEP prescribed 07/06/2022 independent Goal status: MET   2.  Pt will demonstrate grossly symmetrical lumbar rotation ROM with less than 2/10 pain on NPS in order to demonstrate improved tolerance to functional mobility. Baseline: see ROM chart as above 07/06/22 see ROM chart above, no pain Goal status: MET   3.  Pt will demonstrate grossly symmetrical hip MMT in order to demonstrate improved  strength/mechanics for functional movements.  Baseline: see MMT chart above 07/06/2022 see MMT chart above Goal status: MET   4. Pt will perform 5xSTS in <13 sec in order to demonstrate reduced fall risk and improved functional independence. (MCID of 2.3sec)            Baseline: 21sec standard chair no UE support  06/19/22: 15sec standard chair no UE support  07/06/2022 13sec             Goal status: NEARLY MET   5. Pt will report ability to perform lower body dressing with less than 3/10 pain on NPS in order to demonstrate improved tolerance to ADLs.            Baseline: pain w/ lower body dressing up to 6-7/10  07/06/2022 no pain with lower body dressing            Goal status: MET   PLAN: DISCHARGE 07/06/2022    PT FREQUENCY: n/a   PT DURATION:n/a   PLANNED INTERVENTIONS: Therapeutic exercises, Therapeutic activity, Neuromuscular re-education, Balance training, Gait training, Patient/Family education, Self Care, Joint mobilization, Stair training, DME instructions, Aquatic Therapy, Dry Needling, Spinal mobilization, Cryotherapy, Moist heat, Taping, Manual therapy,  and Re-evaluation.   PLAN FOR NEXT SESSION: n/a   Leeroy Cha PT, DPT 07/06/2022 2:45 PM

## 2022-08-01 ENCOUNTER — Other Ambulatory Visit: Payer: Self-pay | Admitting: Family Medicine

## 2022-08-01 DIAGNOSIS — M479 Spondylosis, unspecified: Secondary | ICD-10-CM

## 2022-08-09 ENCOUNTER — Other Ambulatory Visit: Payer: Self-pay

## 2022-08-09 DIAGNOSIS — E1169 Type 2 diabetes mellitus with other specified complication: Secondary | ICD-10-CM

## 2022-08-09 MED ORDER — ATORVASTATIN CALCIUM 20 MG PO TABS: ORAL_TABLET | ORAL | 2 refills | Status: DC

## 2022-08-13 ENCOUNTER — Other Ambulatory Visit: Payer: Self-pay | Admitting: Family Medicine

## 2022-08-13 ENCOUNTER — Other Ambulatory Visit: Payer: Self-pay | Admitting: Internal Medicine

## 2022-08-13 DIAGNOSIS — I491 Atrial premature depolarization: Secondary | ICD-10-CM

## 2022-08-13 DIAGNOSIS — N3941 Urge incontinence: Secondary | ICD-10-CM

## 2022-08-13 DIAGNOSIS — E119 Type 2 diabetes mellitus without complications: Secondary | ICD-10-CM

## 2022-08-17 ENCOUNTER — Other Ambulatory Visit: Payer: Self-pay | Admitting: Family Medicine

## 2022-08-17 ENCOUNTER — Other Ambulatory Visit: Payer: Self-pay | Admitting: Internal Medicine

## 2022-08-17 DIAGNOSIS — E119 Type 2 diabetes mellitus without complications: Secondary | ICD-10-CM

## 2022-08-17 DIAGNOSIS — M479 Spondylosis, unspecified: Secondary | ICD-10-CM

## 2022-08-17 DIAGNOSIS — I491 Atrial premature depolarization: Secondary | ICD-10-CM

## 2022-09-13 ENCOUNTER — Encounter: Payer: Self-pay | Admitting: Family Medicine

## 2022-09-13 ENCOUNTER — Ambulatory Visit (INDEPENDENT_AMBULATORY_CARE_PROVIDER_SITE_OTHER): Payer: Medicare HMO | Admitting: Family Medicine

## 2022-09-13 VITALS — BP 142/83 | HR 65 | Ht 63.0 in | Wt 194.1 lb

## 2022-09-13 DIAGNOSIS — J302 Other seasonal allergic rhinitis: Secondary | ICD-10-CM

## 2022-09-13 DIAGNOSIS — I10 Essential (primary) hypertension: Secondary | ICD-10-CM

## 2022-09-13 DIAGNOSIS — E119 Type 2 diabetes mellitus without complications: Secondary | ICD-10-CM

## 2022-09-13 MED ORDER — FEXOFENADINE HCL 60 MG PO TABS: 60.0000 mg | ORAL_TABLET | ORAL | 6 refills | Status: DC | PRN

## 2022-09-13 MED ORDER — FLUTICASONE PROPIONATE 50 MCG/ACT NA SUSP: NASAL | 2 refills | Status: DC

## 2022-09-13 NOTE — Patient Instructions (Addendum)
It was great to see you! Thank you for allowing me to participate in your care!  I recommend that you always bring your medications to each appointment as this makes it easy to ensure we are on the correct medications and helps [place] not miss when refills are needed.  Our plans for today:  - Please measure your blood pressure at home daily for 2 weeks, and bring in log at next visit. -We will discuss your cirrhosis at your next visit.    Take care and seek immediate care sooner if you develop any concerns.   Dr. Salvadore Oxford, MD [place]

## 2022-09-13 NOTE — Progress Notes (Signed)
    SUBJECTIVE:   CHIEF COMPLAINT / HPI: follow-up  Back pain - doing better with physical therapy.   HTN - Taking Amlodipine, and Coreg. No chest pain or shortness breath. Not measuring at home.  PERTINENT  PMH / PSH: HTN, Cirrhosis without ascites, T2DM, Osteoporosis  OBJECTIVE:   BP (!) 142/83   Pulse 65   Ht 5\' 3"  (1.6 m)   Wt 194 lb 2 oz (88.1 kg)   SpO2 100%   BMI 34.39 kg/m   General: NAD Cardiovascular: RRR, no murmurs, trace peripheral edema Respiratory: normal WOB on RA, CTAB, no wheezes, ronchi or rales Abdomen: soft, NTTP, no rebound or guarding Extremities: Moving all 4 extremities equally   ASSESSMENT/PLAN:   Primary hypertension Assessment & Plan: Mildly above goal today in clinic x2. Patient asymptomatic. Patient would like to observe Bps outside of clinic at bring in log at next visit before considering increase in medication. Will follow-up in 2 weeks. Likely will increase amlodipine to 5mg  next visit.   Seasonal allergies -     Fluticasone Propionate; SPRAY 2 SPRAYS INTO EACH NOSTRIL EVERY DAY  Dispense: 48 mL; Refill: 2 -     Fexofenadine HCl; Take 1 tablet (60 mg total) by mouth as needed.  Dispense: 30 tablet; Refill: 6   Return in about 2 weeks (around 09/27/2022).  Salvadore Oxford, MD [place]

## 2022-09-14 DIAGNOSIS — J302 Other seasonal allergic rhinitis: Secondary | ICD-10-CM | POA: Insufficient documentation

## 2022-09-14 NOTE — Assessment & Plan Note (Signed)
Mildly above goal today in clinic x2. Patient asymptomatic. Patient would like to observe Bps outside of clinic at bring in log at next visit before considering increase in medication. Will follow-up in 2 weeks. Likely will increase amlodipine to 5mg  next visit.

## 2022-09-19 ENCOUNTER — Ambulatory Visit: Payer: Medicare HMO | Admitting: Podiatry

## 2022-10-01 ENCOUNTER — Other Ambulatory Visit: Payer: Self-pay | Admitting: Family Medicine

## 2022-10-01 DIAGNOSIS — M810 Age-related osteoporosis without current pathological fracture: Secondary | ICD-10-CM

## 2022-10-03 ENCOUNTER — Ambulatory Visit (INDEPENDENT_AMBULATORY_CARE_PROVIDER_SITE_OTHER): Payer: Medicare HMO | Admitting: Podiatry

## 2022-10-03 ENCOUNTER — Encounter: Payer: Self-pay | Admitting: Podiatry

## 2022-10-03 VITALS — BP 159/88 | HR 78

## 2022-10-03 DIAGNOSIS — E119 Type 2 diabetes mellitus without complications: Secondary | ICD-10-CM

## 2022-10-03 DIAGNOSIS — M79674 Pain in right toe(s): Secondary | ICD-10-CM

## 2022-10-03 DIAGNOSIS — M79675 Pain in left toe(s): Secondary | ICD-10-CM

## 2022-10-03 DIAGNOSIS — B351 Tinea unguium: Secondary | ICD-10-CM

## 2022-10-03 NOTE — Progress Notes (Signed)
This patient returns to my office for at risk foot care.  This patient requires this care by a professional since this patient will be at risk due to having type 2 diabetes.  This patient is unable to cut nails himself since the patient cannot reach his nails.These nails are painful walking and wearing shoes.  This patient presents for at risk foot care today.  General Appearance  Alert, conversant and in no acute stress.  Vascular  Dorsalis pedis and posterior tibial  pulses are palpable  bilaterally.  Capillary return is within normal limits  bilaterally. Temperature is within normal limits  bilaterally.  Neurologic  Senn-Weinstein monofilament wire test within normal limits  bilaterally. Muscle power within normal limits bilaterally.  Nails Thick disfigured discolored nails with subungual debris  from hallux to fifth toes bilaterally. No evidence of bacterial infection or drainage bilaterally.  Orthopedic  No limitations of motion  feet .  No crepitus or effusions noted.  No bony pathology or digital deformities noted.  Skin  normotropic skin with no porokeratosis noted bilaterally.  No signs of infections or ulcers noted.     Onychomycosis  Pain in right toes  Pain in left toes  Consent was obtained for treatment procedures.   Mechanical debridement of nails 1-5  bilaterally performed with a nail nipper.  Filed with dremel without incident.    Return office visit    3 months                 Told patient to return for periodic foot care and evaluation due to potential at risk complications.   Anali Cabanilla DPM  

## 2022-10-04 ENCOUNTER — Ambulatory Visit: Payer: Self-pay | Admitting: Family Medicine

## 2022-10-04 NOTE — Progress Notes (Deleted)
    SUBJECTIVE:   CHIEF COMPLAINT / HPI: BP follow-up  ***  PERTINENT  PMH / PSH: ***  OBJECTIVE:   There were no vitals taken for this visit.  ***  ASSESSMENT/PLAN:   There are no diagnoses linked to this encounter. No follow-ups on file.  Celine Mans, MD [facility]

## 2022-10-15 ENCOUNTER — Encounter: Payer: Self-pay | Admitting: Family Medicine

## 2022-10-15 ENCOUNTER — Ambulatory Visit (INDEPENDENT_AMBULATORY_CARE_PROVIDER_SITE_OTHER): Payer: Medicare HMO | Admitting: Family Medicine

## 2022-10-15 VITALS — BP 124/74 | HR 80 | Ht 63.0 in | Wt 195.0 lb

## 2022-10-15 DIAGNOSIS — K7460 Unspecified cirrhosis of liver: Secondary | ICD-10-CM | POA: Diagnosis not present

## 2022-10-15 DIAGNOSIS — Z1231 Encounter for screening mammogram for malignant neoplasm of breast: Secondary | ICD-10-CM

## 2022-10-15 DIAGNOSIS — I10 Essential (primary) hypertension: Secondary | ICD-10-CM

## 2022-10-15 DIAGNOSIS — M810 Age-related osteoporosis without current pathological fracture: Secondary | ICD-10-CM | POA: Diagnosis not present

## 2022-10-15 DIAGNOSIS — E119 Type 2 diabetes mellitus without complications: Secondary | ICD-10-CM | POA: Diagnosis not present

## 2022-10-15 LAB — POCT GLYCOSYLATED HEMOGLOBIN (HGB A1C): HbA1c, POC (controlled diabetic range): 5.7 % (ref 0.0–7.0)

## 2022-10-15 NOTE — Assessment & Plan Note (Signed)
Due for Prolia Injection. Ordered. Continue Calcium and Vitamin D supplementation.

## 2022-10-15 NOTE — Progress Notes (Signed)
    SUBJECTIVE:   CHIEF COMPLAINT / HPI: BP follow-up  BP - Brought in BP log. Blood pressure 120-130s over 70s.   Osteoporosis - Prolia injection ordered. Patient taking vitamin D and calcium supplementation.  Feels like food is taking a long time to digest. Up to 3 hours after meals. Feels liking the food is "coming up". Does NOT feel like nausea or burning. No emesis. Having bowel movements once per day not constipated. Started having this sensation last year. Not worse at night. Main foods (eggs, bread, oatmeal, beans, sweet potatoes, vegetables).  PERTINENT  PMH / PSH: Osteoporosis, Hep C, Cirrhosis, T2DM  OBJECTIVE:   BP 124/74   Pulse 80   Ht 5\' 3"  (1.6 m)   Wt 195 lb (88.5 kg)   SpO2 98%   BMI 34.54 kg/m   General: NAD Cardiovascular: RRR, no murmurs, no peripheral edema Respiratory: normal WOB on RA, CTAB, no wheezes, ronchi or rales Abdomen: soft, NTTP, no rebound or guarding Extremities: Moving all 4 extremities equally   ASSESSMENT/PLAN:   Prolonged satiety Reported prolonged satiety with meals in suggestive of gastric outlet obstruction (malignancy) versus gastroparesis. GERD or reflux less likely given clinical history; however, possible contributor. Patient's diabetes is controlled within last year, making gastroparesis less likely given timeline of symptom onset. Will evaluate liver given Hep C and cirrhosis history with RUQ [place]. Discussed trialing Pepcid for patient daily for 1-2 weeks. Will follow-up in 2-4 weeks. Consider further imaging and GI referral if symptoms not improving.   Primary hypertension Assessment & Plan: BP controlled today, and per patient log. Continue carvedilol and amlodipine.   Cirrhosis of liver without ascites, unspecified hepatic cirrhosis type (HCC) Assessment & Plan: Surveillance AFP and RUQ [place] ordered  Orders: -     AFP tumor marker -     US ABDOMEN LIMITED RUQ (LIVER/GB); Future  Type 2 diabetes mellitus without  complication, without long-term current use of insulin (HCC) -     POCT glycosylated hemoglobin (Hb A1C)  Encounter for screening mammogram for malignant neoplasm of breast -     3D Screening Mammogram, Left and Right; Future  Osteoporosis, unspecified osteoporosis type, unspecified pathological fracture presence Assessment & Plan: Due for Prolia Injection. Ordered. Continue Calcium and Vitamin D supplementation.    Return in about 1 month (around 11/14/2022).  Celine Mans, MD [facility]

## 2022-10-15 NOTE — Assessment & Plan Note (Signed)
Surveillance AFP and RUQ [place] ordered

## 2022-10-15 NOTE — Assessment & Plan Note (Signed)
BP controlled today, and per patient log. Continue carvedilol and amlodipine.

## 2022-10-15 NOTE — Patient Instructions (Addendum)
It was great to see you! Thank you for allowing me to participate in your care!  I recommend that you always bring your medications to each appointment as this makes it easy to ensure we are on the correct medications and helps [place] not miss when refills are needed.  Our plans for today:  - I will let you know the results of your liver ultrasound and bloodwork. - Please get mammogram breast cancer screening done - Your Prolia injection is ordered. The pharmacy should contact you to schedule delivery and pick-up. - Please try PEPCID over the counter for your stomach fullness, please follow-up in 2-4 weeks to see how you are doing.  We are checking some labs today, I will call you if they are abnormal will send you a MyChart message or a letter if they are normal.  If you do not hear about your labs in the next 2 weeks please let us know.  Please arrive 15 minutes PRIOR to your next scheduled appointment time! If you do not, this affects OTHER patients' care.  Take care and seek immediate care sooner if you develop any concerns.   Dr. Celine Mans, MD [facility]

## 2022-10-17 LAB — AFP TUMOR MARKER: AFP, Serum, Tumor Marker: 3.5 ng/mL (ref 0.0–9.2)

## 2022-10-26 ENCOUNTER — Ambulatory Visit (HOSPITAL_COMMUNITY)
Admission: RE | Admit: 2022-10-26 | Discharge: 2022-10-26 | Disposition: A | Discharge status: Home / Self Care | Payer: Medicare HMO | Source: Ambulatory Visit | Attending: Family Medicine | Admitting: Family Medicine

## 2022-10-26 DIAGNOSIS — K7460 Unspecified cirrhosis of liver: Secondary | ICD-10-CM | POA: Diagnosis not present

## 2022-11-03 ENCOUNTER — Other Ambulatory Visit: Payer: Self-pay | Admitting: Family Medicine

## 2022-11-03 DIAGNOSIS — M479 Spondylosis, unspecified: Secondary | ICD-10-CM

## 2022-11-20 ENCOUNTER — Encounter: Payer: Self-pay | Admitting: Family Medicine

## 2022-11-20 ENCOUNTER — Ambulatory Visit
Admission: RE | Admit: 2022-11-20 | Discharge: 2022-11-20 | Disposition: A | Discharge status: Home / Self Care | Payer: Medicare HMO | Source: Ambulatory Visit | Attending: Family Medicine | Admitting: Family Medicine

## 2022-11-20 DIAGNOSIS — Z1231 Encounter for screening mammogram for malignant neoplasm of breast: Secondary | ICD-10-CM | POA: Diagnosis not present

## 2022-11-22 ENCOUNTER — Ambulatory Visit (INDEPENDENT_AMBULATORY_CARE_PROVIDER_SITE_OTHER): Payer: Medicare HMO | Admitting: Family Medicine

## 2022-11-22 ENCOUNTER — Encounter: Payer: Self-pay | Admitting: Family Medicine

## 2022-11-22 ENCOUNTER — Ambulatory Visit (HOSPITAL_COMMUNITY)
Admission: RE | Admit: 2022-11-22 | Discharge: 2022-11-22 | Disposition: A | Discharge status: Home / Self Care | Payer: Medicare HMO | Source: Ambulatory Visit | Attending: Family Medicine | Admitting: Family Medicine

## 2022-11-22 VITALS — BP 139/75 | HR 68 | Ht 63.0 in | Wt 192.6 lb

## 2022-11-22 DIAGNOSIS — M5137 Other intervertebral disc degeneration, lumbosacral region: Secondary | ICD-10-CM | POA: Diagnosis not present

## 2022-11-22 DIAGNOSIS — M479 Spondylosis, unspecified: Secondary | ICD-10-CM | POA: Diagnosis not present

## 2022-11-22 DIAGNOSIS — M810 Age-related osteoporosis without current pathological fracture: Secondary | ICD-10-CM | POA: Diagnosis not present

## 2022-11-22 DIAGNOSIS — M545 Low back pain, unspecified: Secondary | ICD-10-CM | POA: Insufficient documentation

## 2022-11-22 DIAGNOSIS — R1030 Lower abdominal pain, unspecified: Secondary | ICD-10-CM

## 2022-11-22 MED ORDER — BACLOFEN 10 MG PO TABS
5.0000 mg | ORAL_TABLET | Freq: Every day | ORAL | 0 refills | Status: DC | PRN
Start: 2022-11-22 — End: 2023-02-12

## 2022-11-22 NOTE — Patient Instructions (Addendum)
It was wonderful to see you today.  Please bring ALL of your medications with you to every visit.   Updates from today's visit:  Please visit [facility] across the street to obtain an x-ray of your back.  I will give you a call if anything is abnormal  Please give our office a call or return for a visit if your pain is worsening, if you have any fevers, difficulty/pain with urinating, loss of bowel function, nausea or vomiting  Please follow up in 3 weeks  Thank you for choosing [facility].   Please call [phone] with any questions about today's appointment.  Please be sure to schedule follow up at the front  desk before you leave today.   Vonna Drafts, MD  Family Medicine

## 2022-11-22 NOTE — Progress Notes (Signed)
  SUBJECTIVE:   CHIEF COMPLAINT / HPI:   Low back pain since Saturday -No trauma, no activity during onset -Painful at rest -Not relieved by positioning -Endorses occasional radicular pain down L leg -no new bladder/bowel incontinence -no saddle anesthesia -No fevers  Lower abdominal pain x2 weeks -No dysuria, hematuria -No vaginal discharge or bleeding -No recent fevers, illness -Nonradiating   PERTINENT  PMH / PSH: hx osteoporosis  Past Medical History:  Diagnosis Date   Abnormal liver CT 08/27/2019   CT AP (08/26/19, [facility]): Mildly nodular hepatic contour c/w possible mild cirrhosis.   Diabetes mellitus type II, controlled (HCC) 08/27/2019   Dyspnea 09/03/2021   Hepatitis C     OBJECTIVE:  BP 139/75   Pulse 68   Ht 5\' 3"  (1.6 m)   Wt 192 lb 9.6 oz (87.4 kg)   SpO2 98%   BMI 34.12 kg/m   General: NAD, pleasant, able to participate in exam Cardiac: RRR, no murmurs auscultated Respiratory: CTAB, normal WOB Abdomen: soft, non-distended, mild discomfort to palpation across lower quadrants and suprapubic area. No rebound/guarding. No CVA tenderness b/l MSK: TTP along midline lower lumbar spinous process. Pain with flexion/extension of back. Nontender along cervical and thoracic spine.  Neuro: Gait limited due to pain. L straight leg test does appear to cause pain when patient is asked, though she did not show immediate pain response to the test. R leg unremarkable. Strength 5/5 b/l lower extremities.  ASSESSMENT/PLAN:   1. Acute midline low back pain, unspecified whether sciatica present No red flags detected on exam or any history to raise concern for cauda equina or epidural abscess.  However, she does have midline vertebral tenderness on exam and given her history of osteoporosis I do have some concern for compression fracture.  Will obtain x-ray for this.  Also has documented hx of osteoporosis which may be contributing. Straight leg test equivocal, possible  radiculopathy.  However, will hold off on MRI given that this only been going on for a few days, if it is a slipped disc it may correct on its own.  Advised conservative management with Tylenol and either naproxen or voltaren. Sent baclofen if these do not provide relief.  - DG Lumbar Spine 2-3 Views; Future  2. Lower abdominal pain Low suspicion for UTI given lack of urinary symptoms including dysuria or hematuria.  Low suspicion for nephrolithiasis given lack of CVA tenderness and no hematuria.  No vaginal bleeding to suggest gynecologic etiology.  No recent fevers/illness to suggest infectious etiology.  Unclear cause but encouraged conservative management, advised close follow-up and provided return precautions.  Meds ordered this encounter  Medications   baclofen (LIORESAL) 10 MG tablet    Sig: Take 0.5 tablets (5 mg total) by mouth daily as needed for muscle spasms.    Dispense:  30 tablet    Refill:  0   Return in about 3 weeks (around 12/13/2022) for Back and abdominal pain.  Vonna Drafts, MD [facility]

## 2022-11-30 ENCOUNTER — Other Ambulatory Visit: Payer: Self-pay

## 2022-11-30 ENCOUNTER — Other Ambulatory Visit: Payer: Self-pay | Admitting: Family Medicine

## 2022-11-30 DIAGNOSIS — M810 Age-related osteoporosis without current pathological fracture: Secondary | ICD-10-CM

## 2022-11-30 MED ORDER — AMLODIPINE BESYLATE 2.5 MG PO TABS: 2.5000 mg | ORAL_TABLET | Freq: Every day | ORAL | 3 refills | Status: DC

## 2022-11-30 MED ORDER — PROLIA 60 MG/ML ~~LOC~~ SOSY
PREFILLED_SYRINGE | SUBCUTANEOUS | 0 refills | Status: DC
Start: 2022-11-30 — End: 2023-03-29

## 2022-12-03 ENCOUNTER — Other Ambulatory Visit: Payer: Self-pay | Admitting: Family Medicine

## 2022-12-03 DIAGNOSIS — M810 Age-related osteoporosis without current pathological fracture: Secondary | ICD-10-CM

## 2022-12-08 ENCOUNTER — Other Ambulatory Visit: Payer: Self-pay | Admitting: Family Medicine

## 2022-12-08 DIAGNOSIS — M545 Low back pain, unspecified: Secondary | ICD-10-CM

## 2022-12-08 DIAGNOSIS — N3941 Urge incontinence: Secondary | ICD-10-CM

## 2022-12-08 DIAGNOSIS — M479 Spondylosis, unspecified: Secondary | ICD-10-CM

## 2022-12-28 ENCOUNTER — Other Ambulatory Visit: Payer: Medicare HMO

## 2022-12-28 DIAGNOSIS — M810 Age-related osteoporosis without current pathological fracture: Secondary | ICD-10-CM | POA: Diagnosis not present

## 2022-12-29 LAB — COMPREHENSIVE METABOLIC PANEL
ALT: 21 IU/L (ref 0–32)
AST: 20 IU/L (ref 0–40)
Albumin: 4.8 g/dL (ref 3.8–4.8)
Alkaline Phosphatase: 66 IU/L (ref 44–121)
BUN/Creatinine Ratio: 17 (ref 12–28)
BUN: 11 mg/dL (ref 8–27)
Bilirubin Total: 0.3 mg/dL (ref 0.0–1.2)
CO2: 23 mmol/L (ref 20–29)
Calcium: 9.6 mg/dL (ref 8.7–10.3)
Chloride: 100 mmol/L (ref 96–106)
Creatinine, Ser: 0.64 mg/dL (ref 0.57–1.00)
Globulin, Total: 2.8 g/dL (ref 1.5–4.5)
Glucose: 86 mg/dL (ref 70–99)
Potassium: 3.7 mmol/L (ref 3.5–5.2)
Sodium: 140 mmol/L (ref 134–144)
Total Protein: 7.6 g/dL (ref 6.0–8.5)
eGFR: 93 mL/min/{1.73_m2} (ref >59)

## 2022-12-29 LAB — MAGNESIUM: Magnesium: 1.9 mg/dL (ref 1.6–2.3)

## 2022-12-29 LAB — PHOSPHORUS: Phosphorus: 3.2 mg/dL (ref 3.0–4.3)

## 2023-01-02 ENCOUNTER — Ambulatory Visit: Payer: Medicare HMO | Admitting: Podiatry

## 2023-01-06 NOTE — Progress Notes (Unsigned)
Subjective:   Tracey Riddle is a 72 y.o. female who presents for an Initial Medicare Annual Wellness Visit.  Visit Complete: {VISITMETHOD:[phone]}  Patient Medicare AWV questionnaire was completed by the patient on ***; I have confirmed that all information answered by patient is correct and no changes since this date.  Review of Systems    ***       Objective:    There were no vitals filed for this visit. There is no height or weight on file to calculate BMI.     11/22/2022    3:35 PM 10/15/2022    4:12 PM 09/13/2022    4:10 PM 05/07/2022    4:37 PM 04/16/2022    4:03 PM 08/25/2021    2:01 PM 08/12/2020    9:17 AM  Advanced Directives  Does Patient Have a Medical Advance Directive? No No No No No No No  Would patient like information on creating a medical advance directive? No - Patient declined No - Patient declined No - Patient declined Yes (MAU/Ambulatory/Procedural Areas - Information given) No - Patient declined No - Patient declined No - Patient declined    Current Medications (verified) Outpatient Encounter Medications as of 01/07/2023  Medication Sig   acetaminophen (TYLENOL) 500 MG tablet Take 2 tablets by mouth every 8 (eight) hours as needed for mild pain, moderate pain or headache.   amLODipine (NORVASC) 2.5 MG tablet Take 1 tablet (2.5 mg total) by mouth at bedtime.   atorvastatin (LIPITOR) 20 MG tablet TAKE 1 TABLET BY MOUTH DAILY AT 6 PM.   baclofen (LIORESAL) 10 MG tablet Take 0.5 tablets (5 mg total) by mouth daily as needed for muscle spasms.   Calcium Carb-Cholecalciferol (OYSTER SHELL CALCIUM W/D) 500-5 MG-MCG TABS TAKE 2 TABLET BY MOUTH EVERY DAY WITH BREAKFAST   carvedilol (COREG) 3.125 MG tablet Take 1 tablet (3.125 mg total) by mouth 2 (two) times daily with a meal.   denosumab (PROLIA) 60 MG/ML SOSY injection INJECT 1 SYRINGE UNDER THE SKIN ONCE EVERY 6 MONTHS   fesoterodine (TOVIAZ) 8 MG TB24 tablet TAKE 1 TABLET BY MOUTH EVERY DAY   fexofenadine  (ALLEGRA ALLERGY) 60 MG tablet Take 1 tablet (60 mg total) by mouth as needed.   fluticasone (FLONASE) 50 MCG/ACT nasal spray SPRAY 2 SPRAYS INTO EACH NOSTRIL EVERY DAY   nitrofurantoin, macrocrystal-monohydrate, (MACROBID) 100 MG capsule Take 1 capsule (100 mg total) by mouth 2 (two) times daily. (Patient not taking: Reported on 10/03/2022)   ondansetron (ZOFRAN-ODT) 4 MG disintegrating tablet Take 4 mg by mouth 3 (three) times daily as needed.   sennosides-docusate sodium (SENOKOT-S) 8.6-50 MG tablet Take 1 tablet by mouth daily.   No facility-administered encounter medications on file as of 01/07/2023.    Allergies (verified) Patient has no known allergies.   History: Past Medical History:  Diagnosis Date   Abnormal liver CT 08/27/2019   CT AP (08/26/19, [facility]): Mildly nodular hepatic contour c/w possible mild cirrhosis.   Diabetes mellitus type II, controlled (HCC) 08/27/2019   Dyspnea 09/03/2021   Hepatitis C    Past Surgical History:  Procedure Laterality Date   BIOPSY  09/07/2019   Procedure: BIOPSY;  Surgeon: Charlott Rakes, MD;  Location[facility] ENDOSCOPY;  Service: Endoscopy;;   ESOPHAGEAL BRUSHING  09/07/2019   Procedure: ESOPHAGEAL BRUSHING;  Surgeon: Charlott Rakes, MD;  Location: [facility] ENDOSCOPY;  Service: Endoscopy;;   ESOPHAGOGASTRODUODENOSCOPY (EGD) WITH PROPOFOL Left 09/07/2019   Procedure: ESOPHAGOGASTRODUODENOSCOPY (EGD) WITH PROPOFOL;  Surgeon: Charlott Rakes,  MD;  Location: [facility] ENDOSCOPY;  Service: Endoscopy;  Laterality: Left;   No family history on file. Social History   Socioeconomic History   Marital status: Single    Spouse name: Not on file   Number of children: Not on file   Years of education: Not on file   Highest education level: Not on file  Occupational History   Not on file  Tobacco Use   Smoking status: Never   Smokeless tobacco: Never  Substance and Sexual Activity   Alcohol use: Not on file   Drug use: Never   Sexual activity: Never   Other Topics Concern   Not on file  Social History Narrative   Not on file   Social Determinants of Health   Financial Resource Strain: Not on file  Food Insecurity: Not on file  Transportation Needs: Not on file  Physical Activity: Not on file  Stress: Not on file  Social Connections: Not on file    Tobacco Counseling Counseling given: Not Answered   Clinical Intake:                        Activities of Daily Living     No data to display          Patient Care Team: Celine Mans, MD as PCP - General (Family Medicine) Christell Constant, MD as PCP - Cardiology (Cardiology)  Indicate any recent Medical Services you may have received from other than [facility] providers in the past year (date may be approximate).     Assessment:   This is a routine wellness examination for Tracey Riddle.  Hearing/Vision screen No results found.  Dietary issues and exercise activities discussed:     Goals Addressed   None    Depression Screen    11/22/2022    3:38 PM 10/15/2022    4:12 PM 09/13/2022    4:10 PM 04/16/2022    4:04 PM 03/15/2022    2:27 PM 09/18/2021    8:54 AM 08/25/2021    2:02 PM  PHQ 2/9 Scores  PHQ - 2 Score 1 0 0 0 0 0 0  PHQ- 9 Score 4 1 2  0 0  2    Fall Risk    11/22/2022    3:34 PM 09/13/2022    4:13 PM 04/16/2022    4:05 PM 03/15/2022    2:27 PM 09/18/2021    8:54 AM  Fall Risk   Falls in the past year? 0 0 0 0 0  Number falls in past yr: 0 0 0 0 0  Injury with Fall? 0 0 0 0 0    MEDICARE RISK AT HOME:   TIMED UP AND GO:  Was the test performed? No    Cognitive Function:        Immunizations Immunization History  Administered Date(s) Administered   Fluad Quad(high Dose 65+) 05/04/2020, 03/15/2022   Hepatitis B, ADULT 09/15/2019   Influenza, High Dose Seasonal PF 03/28/2019   Moderna Sars-Covid-2 Vaccination 07/01/2019, 07/29/2019   PFIZER(Purple Top)SARS-COV-2 Vaccination 05/27/2020   PNEUMOCOCCAL CONJUGATE-20  03/15/2022   Pneumococcal Polysaccharide-23 04/17/2018    TDAP status: Due, Education has been provided regarding the importance of this vaccine. Advised may receive this vaccine at local pharmacy or Health Dept. Aware to provide a copy of the vaccination record if obtained from local pharmacy or Health Dept. Verbalized acceptance and understanding.  Pneumococcal vaccine status: Up to date  Covid-19 vaccine status: Information provided on  how to obtain vaccines.   Qualifies for Shingles Vaccine? Yes   Zostavax completed No   Shingrix Completed?: No.    Education has been provided regarding the importance of this vaccine. Patient has been advised to call insurance company to determine out of pocket expense if they have not yet received this vaccine. Advised may also receive vaccine at local pharmacy or Health Dept. Verbalized acceptance and understanding.  Screening Tests Health Maintenance  Topic Date Due   Medicare Annual Wellness (AWV)  Never done   DTaP/Tdap/Td (1 - Tdap) Never done   Zoster Vaccines- Shingrix (1 of 2) Never done   COVID-19 Vaccine (4 - 2023-24 season) 02/16/2022   OPHTHALMOLOGY EXAM  04/17/2022   Diabetic kidney evaluation - Urine ACR  08/26/2022   INFLUENZA VACCINE  01/17/2023   FOOT EXAM  03/06/2023   HEMOGLOBIN A1C  04/16/2023   Diabetic kidney evaluation - eGFR measurement  12/28/2023   MAMMOGRAM  11/19/2024   Colonoscopy  05/04/2026   Pneumonia Vaccine 22+ Years old  Completed   DEXA SCAN  Completed   Hepatitis C Screening  Completed   HPV VACCINES  Aged Out    Health Maintenance  Health Maintenance Due  Topic Date Due   Medicare Annual Wellness (AWV)  Never done   DTaP/Tdap/Td (1 - Tdap) Never done   Zoster Vaccines- Shingrix (1 of 2) Never done   COVID-19 Vaccine (4 - 2023-24 season) 02/16/2022   OPHTHALMOLOGY EXAM  04/17/2022   Diabetic kidney evaluation - Urine ACR  08/26/2022    Colorectal cancer screening: Type of screening: Colonoscopy.  Completed 05/04/16. Repeat every 10 years  Mammogram status: Completed 11/20/22. Repeat every year  Bone Density status: Completed 05/14/22. Results reflect: Bone density results: OSTEOPOROSIS. Repeat every 2 years.  Lung Cancer Screening: (Low Dose CT Chest recommended if Age 78-80 years, 20 pack-year currently smoking OR have quit w/in 15years.) does not qualify.   Lung Cancer Screening Referral: n/a  Additional Screening:  Hepatitis C Screening: does qualify; Completed 08/22/20  Vision Screening: Recommended annual ophthalmology exams for early detection of glaucoma and other disorders of the eye. Is the patient up to date with their annual eye exam?  {YES/NO:21197} Who is the provider or what is the name of the office in which the patient attends annual eye exams? *** If pt is not established with a provider, would they like to be referred to a provider to establish care? {YES/NO:21197}.   Dental Screening: Recommended annual dental exams for proper oral hygiene  Diabetic Foot Exam: Diabetic Foot Exam: Completed 03/05/22  Community Resource Referral / Chronic Care Management: CRR required this visit?  {YES/NO:21197}  CCM required this visit?  {CCM Required choices:[phone]}     Plan:     I have personally reviewed and noted the following in the patient's chart:   Medical and social history Use of alcohol, tobacco or illicit drugs  Current medications and supplements including opioid prescriptions. {Opioid [phone]} Functional ability and status Nutritional status Physical activity Advanced directives List of other physicians Hospitalizations, surgeries, and ER visits in previous 12 months Vitals Screenings to include cognitive, depression, and falls Referrals and appointments  In addition, I have reviewed and discussed with patient certain preventive protocols, quality metrics, and best practice recommendations. A written personalized care plan for  preventive services as well as general preventive health recommendations were provided to patient.     Kandis Fantasia [place], [place]   [id number]   After Visit Summary: {CHL AMB  AWV After Visit [phone]}  Nurse Notes: ***

## 2023-01-06 NOTE — Patient Instructions (Incomplete)
Tracey Riddle , Thank you for taking time to come for your Medicare Wellness Visit. I appreciate your ongoing commitment to your health goals. Please review the following plan we discussed and let me know if I can assist you in the future.   These are the goals we discussed:  Goals   None     This is a list of the screening recommended for you and due dates:  Health Maintenance  Topic Date Due   Medicare Annual Wellness Visit  Never done   DTaP/Tdap/Td vaccine (1 - Tdap) Never done   Zoster (Shingles) Vaccine (1 of 2) Never done   COVID-19 Vaccine (4 - 2023-24 season) 02/16/2022   Eye exam for diabetics  04/17/2022   Yearly kidney health urinalysis for diabetes  08/26/2022   Flu Shot  01/17/2023   Complete foot exam   03/06/2023   Hemoglobin A1C  04/16/2023   Yearly kidney function blood test for diabetes  12/28/2023   Mammogram  11/19/2024   Colon Cancer Screening  05/04/2026   Pneumonia Vaccine  Completed   DEXA scan (bone density measurement)  Completed   Hepatitis C Screening  Completed   HPV Vaccine  Aged Out    Advanced directives: Information on Advanced Care Planning can be found at [facility] Advance Health Care Directives Advance Health Care Directives ([email or web address] Please bring a copy of your health care power of attorney and living will to the office to be added to your chart at your convenience.  Conditions/risks identified: Aim for 30 minutes of exercise or brisk walking, 6-8 glasses of water, and 5 servings of fruits and vegetables each day.  Next appointment: Follow up in one year for your annual wellness visit    Preventive Care 65 Years and Older, Female Preventive care refers to lifestyle choices and visits with your health care provider that can promote health and wellness. What does preventive care include? A yearly physical exam. This is also called an annual well check. Dental exams once or twice a year. Routine eye exams. Ask your  health care provider how often you should have your eyes checked. Personal lifestyle choices, including: Daily care of your teeth and gums. Regular physical activity. Eating a healthy diet. Avoiding tobacco and drug use. Limiting alcohol use. Practicing safe sex. Taking low-dose aspirin every day. Taking vitamin and mineral supplements as recommended by your health care provider. What happens during an annual well check? The services and screenings done by your health care provider during your annual well check will depend on your age, overall health, lifestyle risk factors, and family history of disease. Counseling  Your health care provider may ask you questions about your: Alcohol use. Tobacco use. Drug use. Emotional well-being. Home and relationship well-being. Sexual activity. Eating habits. History of falls. Memory and ability to understand (cognition). Work and work Astronomer. Reproductive health. Screening  You may have the following tests or measurements: Height, weight, and BMI. Blood pressure. Lipid and cholesterol levels. These may be checked every 5 years, or more frequently if you are over 32 years old. Skin check. Lung cancer screening. You may have this screening every year starting at age 70 if you have a 30-pack-year history of smoking and currently smoke or have quit within the past 15 years. Fecal occult blood test (FOBT) of the stool. You may have this test every year starting at age 58. Flexible sigmoidoscopy or colonoscopy. You may have a sigmoidoscopy every 5 years or a  colonoscopy every 10 years starting at age 71. Hepatitis C blood test. Hepatitis B blood test. Sexually transmitted disease (STD) testing. Diabetes screening. This is done by checking your blood sugar (glucose) after you have not eaten for a while (fasting). You may have this done every 1-3 years. Bone density scan. This is done to screen for osteoporosis. You may have this done  starting at age 63. Mammogram. This may be done every 1-2 years. Talk to your health care provider about how often you should have regular mammograms. Talk with your health care provider about your test results, treatment options, and if necessary, the need for more tests. Vaccines  Your health care provider may recommend certain vaccines, such as: Influenza vaccine. This is recommended every year. Tetanus, diphtheria, and acellular pertussis (Tdap, Td) vaccine. You may need a Td booster every 10 years. Zoster vaccine. You may need this after age 10. Pneumococcal 13-valent conjugate (PCV13) vaccine. One dose is recommended after age 21. Pneumococcal polysaccharide (PPSV23) vaccine. One dose is recommended after age 19. Talk to your health care provider about which screenings and vaccines you need and how often you need them. This information is not intended to replace advice given to you by your health care provider. Make sure you discuss any questions you have with your health care provider. Document Released: 07/01/2015 Document Revised: 02/22/2016 Document Reviewed: 04/05/2015 Elsevier Interactive Patient Education  2017 [employer].  Fall Prevention in the Home Falls can cause injuries. They can happen to people of all ages. There are many things you can do to make your home safe and to help prevent falls. What can I do on the outside of my home? Regularly fix the edges of walkways and driveways and fix any cracks. Remove anything that might make you trip as you walk through a door, such as a raised step or threshold. Trim any bushes or trees on the path to your home. Use bright outdoor lighting. Clear any walking paths of anything that might make someone trip, such as rocks or tools. Regularly check to see if handrails are loose or broken. Make sure that both sides of any steps have handrails. Any raised decks and porches should have guardrails on the edges. Have any leaves, snow, or  ice cleared regularly. Use sand or salt on walking paths during winter. Clean up any spills in your garage right away. This includes oil or grease spills. What can I do in the bathroom? Use night lights. Install grab bars by the toilet and in the tub and shower. Do not use towel bars as grab bars. Use non-skid mats or decals in the tub or shower. If you need to sit down in the shower, use a plastic, non-slip stool. Keep the floor dry. Clean up any water that spills on the floor as soon as it happens. Remove soap buildup in the tub or shower regularly. Attach bath mats securely with double-sided non-slip rug tape. Do not have throw rugs and other things on the floor that can make you trip. What can I do in the bedroom? Use night lights. Make sure that you have a light by your bed that is easy to reach. Do not use any sheets or blankets that are too big for your bed. They should not hang down onto the floor. Have a firm chair that has side arms. You can use this for support while you get dressed. Do not have throw rugs and other things on the floor that can  make you trip. What can I do in the kitchen? Clean up any spills right away. Avoid walking on wet floors. Keep items that you use a lot in easy-to-reach places. If you need to reach something above you, use a strong step stool that has a grab bar. Keep electrical cords out of the way. Do not use floor polish or wax that makes floors slippery. If you must use wax, use non-skid floor wax. Do not have throw rugs and other things on the floor that can make you trip. What can I do with my stairs? Do not leave any items on the stairs. Make sure that there are handrails on both sides of the stairs and use them. Fix handrails that are broken or loose. Make sure that handrails are as long as the stairways. Check any carpeting to make sure that it is firmly attached to the stairs. Fix any carpet that is loose or worn. Avoid having throw rugs at  the top or bottom of the stairs. If you do have throw rugs, attach them to the floor with carpet tape. Make sure that you have a light switch at the top of the stairs and the bottom of the stairs. If you do not have them, ask someone to add them for you. What else can I do to help prevent falls? Wear shoes that: Do not have high heels. Have rubber bottoms. Are comfortable and fit you well. Are closed at the toe. Do not wear sandals. If you use a stepladder: Make sure that it is fully opened. Do not climb a closed stepladder. Make sure that both sides of the stepladder are locked into place. Ask someone to hold it for you, if possible. Clearly mark and make sure that you can see: Any grab bars or handrails. First and last steps. Where the edge of each step is. Use tools that help you move around (mobility aids) if they are needed. These include: Canes. Walkers. Scooters. Crutches. Turn on the lights when you go into a dark area. Replace any light bulbs as soon as they burn out. Set up your furniture so you have a clear path. Avoid moving your furniture around. If any of your floors are uneven, fix them. If there are any pets around you, be aware of where they are. Review your medicines with your doctor. Some medicines can make you feel dizzy. This can increase your chance of falling. Ask your doctor what other things that you can do to help prevent falls. This information is not intended to replace advice given to you by your health care provider. Make sure you discuss any questions you have with your health care provider. Document Released: 03/31/2009 Document Revised: 11/10/2015 Document Reviewed: 07/09/2014 Elsevier Interactive Patient Education  2017 [employer].

## 2023-01-07 ENCOUNTER — Other Ambulatory Visit: Payer: Self-pay | Admitting: Family Medicine

## 2023-01-07 ENCOUNTER — Ambulatory Visit (INDEPENDENT_AMBULATORY_CARE_PROVIDER_SITE_OTHER): Payer: Medicare HMO

## 2023-01-07 VITALS — Ht 63.0 in | Wt 195.0 lb

## 2023-01-07 DIAGNOSIS — M545 Low back pain, unspecified: Secondary | ICD-10-CM

## 2023-01-07 DIAGNOSIS — Z0000 Encounter for general adult medical examination without abnormal findings: Secondary | ICD-10-CM | POA: Diagnosis not present

## 2023-01-07 DIAGNOSIS — M479 Spondylosis, unspecified: Secondary | ICD-10-CM

## 2023-01-29 ENCOUNTER — Ambulatory Visit (INDEPENDENT_AMBULATORY_CARE_PROVIDER_SITE_OTHER): Payer: Medicare HMO | Admitting: Family Medicine

## 2023-01-29 ENCOUNTER — Encounter: Payer: Self-pay | Admitting: Family Medicine

## 2023-01-29 VITALS — BP 139/83 | HR 68 | Temp 98.1°F | Ht 63.0 in | Wt 193.5 lb

## 2023-01-29 DIAGNOSIS — R1032 Left lower quadrant pain: Secondary | ICD-10-CM | POA: Diagnosis not present

## 2023-01-29 NOTE — Progress Notes (Signed)
    SUBJECTIVE:   CHIEF COMPLAINT / HPI: stomach pain  1-2 weeks feeling bad. Began with pain in left lower abdomen. Cramping non radiating, sometimes on flank. Severe. Constant. Preventing good sleep.No obvious exacerbating factors or alleviating factors. Warm compress, Tylenol and Alleve have not helped.   No nausea or vomiting. No fevers. Hard to tell if urinating more than normal. Did have some itching around time stomach pain. No dysuria. No change with bowel movements. Intermittent dizziness 2-3 times per day. Having headache as well. Less energy. More short of breath with exertion. No blood in stool or urine.   PERTINENT  PMH / PSH: Hx of Hep C, HTN, OA right knee, T2DM, HLD  OBJECTIVE:   BP 139/83   Pulse 68   Temp 98.1 F (36.7 C) (Oral)   Ht 5\' 3"  (1.6 m)   Wt 193 lb 8 oz (87.8 kg)   SpO2 100%   BMI 34.28 kg/m   General: NAD, well appearing Neuro: A&O HEENT: No pain with neck flexion, scleral icterus, no jaundice Cardiovascular: RRR, no murmurs, no peripheral edema Respiratory: normal WOB on RA, CTAB, no wheezes, ronchi or rales Abdomen: soft, tender to deep palpation in left lower quadrant, no rebound or guarding, no costovertebral angle tenderness, no suprapubic tenderness Extremities: Moving all 4 extremities equally   ASSESSMENT/PLAN:   LLQ pain Assessment & Plan: HPI consistent with undifferentiated abdominal pain with additional symptoms of dizziness and headache.  Overall presentation is unusual but differential includes urinary tract infection, nephrolithiasis, mild diverticulitis, ovarian cyst/torsion, constipation.  Suspect UTI or diverticulitis is most likely pathologies.  The patient does not have red flags for presentation to suggest renal stone or ovarian torsion.  Believe meningitis would be unlikely as primary cause of abdominal pain.  She does not have overt signs of active infection and reassuringly has not had fevers.  Given that her vitals are stable  and she is afebrile she does not need to go to the emergency department at this time.  Will evaluate with UA, CBC, BMP.  Will treat as indicated.  Patient does not improve within 1 week or white blood cell count is significantly elevated we will proceed with CT abdomen and pelvis.  Discussed plan with patient and daughter who verbalized understanding.  Recommended continued pain relief with Tylenol and Aleve.  Follow-up 1 week.  Orders: -     CBC -     Basic metabolic panel -     Urinalysis, Routine w reflex microscopic   Return in about 1 week (around 02/05/2023).  Celine Mans, MD [facility]

## 2023-01-29 NOTE — Assessment & Plan Note (Addendum)
HPI consistent with undifferentiated abdominal pain with additional symptoms of dizziness and headache.  Overall presentation is unusual but differential includes urinary tract infection, nephrolithiasis, mild diverticulitis, ovarian cyst/torsion, constipation, ascites.  Suspect UTI or diverticulitis is most likely pathologies.  The patient does not have red flags for presentation to suggest renal stone or ovarian torsion.  Believe meningitis would be unlikely as primary cause of abdominal pain.  She does not have overt signs of active infection and reassuringly has not had fevers.  Given that her vitals are stable and she is afebrile she does not need to go to the emergency department at this time.  Will evaluate with UA, CBC, BMP.  Will treat as indicated.  Patient does not improve within 1 week or white blood cell count is significantly elevated we will proceed with CT abdomen and pelvis.  Discussed plan with patient and daughter who verbalized understanding.  Recommended continued pain relief with Tylenol and Aleve.  Follow-up 1 week.

## 2023-01-29 NOTE — Patient Instructions (Addendum)
It was great to see you! Thank you for allowing me to participate in your care!  Our plans for today:  - I will call to let you know the results of your lab results. - Please go the emergency room if you have any fever, nausea or vomiting. - Please let me know if you would like to get the CT scan of your stomach.    Please arrive 15 minutes PRIOR to your next scheduled appointment time! If you do not, this affects OTHER patients' care.  Take care and seek immediate care sooner if you develop any concerns.   Celine Mans, MD, PGY-2 [facility] 4:05 PM 01/29/2023  [facility]

## 2023-01-30 ENCOUNTER — Telehealth: Payer: Self-pay | Admitting: Family Medicine

## 2023-01-30 DIAGNOSIS — R1032 Left lower quadrant pain: Secondary | ICD-10-CM

## 2023-01-30 NOTE — Telephone Encounter (Signed)
Called patient's niece to discuss lab results and ordering CT scan as discussed yesterday in clinic. Reassuringly lab results are normal; however, patient still symptomatic with out clear reason for abdominal pain. Will proceed with CT Abdomen Pelvis.

## 2023-01-30 NOTE — Telephone Encounter (Signed)
Spoke with patients niece [name]. Informed her of the CT scan for Thur. Aug 15th at 4:45pm at [facility]. Niece understood and said that will be fine. Aquilla Solian, CMA

## 2023-01-31 ENCOUNTER — Ambulatory Visit (HOSPITAL_COMMUNITY): Admission: RE | Admit: 2023-01-31 | Payer: Medicare HMO | Source: Ambulatory Visit

## 2023-02-05 ENCOUNTER — Telehealth: Payer: Self-pay

## 2023-02-05 NOTE — Telephone Encounter (Signed)
Called LVM using [address] [place], [id number])  Gave the following information  CT scheduled for tomorrow Wed 02/06/2023 [facility] Main entrance Arrive by 10:30 am. Nothing to eat or drink before the test.  Our office number was left for the pt to call if she has questions.  Sunday Spillers, CMA

## 2023-02-05 NOTE — Telephone Encounter (Signed)
Patient's niece LVM on nurse line regarding appointment for CT. She states that appointment was canceled due to CT scan not being approved by insurance.   Will forward to [facility] for further advisement.   Veronda Prude, RN

## 2023-02-06 ENCOUNTER — Ambulatory Visit (HOSPITAL_COMMUNITY)
Admission: RE | Admit: 2023-02-06 | Discharge: 2023-02-06 | Disposition: A | Discharge status: Home / Self Care | Payer: Medicare HMO | Source: Ambulatory Visit | Attending: Family Medicine | Admitting: Family Medicine

## 2023-02-06 DIAGNOSIS — K7689 Other specified diseases of liver: Secondary | ICD-10-CM | POA: Diagnosis not present

## 2023-02-06 DIAGNOSIS — R1032 Left lower quadrant pain: Secondary | ICD-10-CM | POA: Diagnosis not present

## 2023-02-07 ENCOUNTER — Other Ambulatory Visit: Payer: Self-pay | Admitting: Family Medicine

## 2023-02-07 DIAGNOSIS — E1169 Type 2 diabetes mellitus with other specified complication: Secondary | ICD-10-CM

## 2023-02-11 ENCOUNTER — Telehealth: Payer: Self-pay | Admitting: Family Medicine

## 2023-02-11 NOTE — Telephone Encounter (Signed)
Called patient's niece (documented to speak for patient) to discuss CT Abdomen results. No new abnormalities. Will see me in clinic tomorrow to follow-up on patient symptoms. Explained plan and answered any questions. States patient has improved.

## 2023-02-12 ENCOUNTER — Encounter: Payer: Self-pay | Admitting: Family Medicine

## 2023-02-12 ENCOUNTER — Ambulatory Visit (INDEPENDENT_AMBULATORY_CARE_PROVIDER_SITE_OTHER): Payer: Medicare HMO | Admitting: Family Medicine

## 2023-02-12 VITALS — BP 138/85 | HR 88 | Ht 63.0 in | Wt 196.2 lb

## 2023-02-12 DIAGNOSIS — M810 Age-related osteoporosis without current pathological fracture: Secondary | ICD-10-CM | POA: Diagnosis not present

## 2023-02-12 DIAGNOSIS — G4486 Cervicogenic headache: Secondary | ICD-10-CM

## 2023-02-12 DIAGNOSIS — R1032 Left lower quadrant pain: Secondary | ICD-10-CM

## 2023-02-12 NOTE — Patient Instructions (Addendum)
It was great to see you! Thank you for allowing me to participate in your care!  Our plans for today:  -Please count how many headaches you have each day and write down and bring into next visit. -I will see you in 1 month to discuss your headache log.   Please arrive 15 minutes PRIOR to your next scheduled appointment time! If you do not, this affects OTHER patients' care.  Take care and seek immediate care sooner if you develop any concerns.   Celine Mans, MD, PGY-2 [facility] 4:00 PM 02/12/2023  [facility]

## 2023-02-12 NOTE — Progress Notes (Unsigned)
    SUBJECTIVE:   CHIEF COMPLAINT / HPI: F/u abdominal pain  Abdominal pain resolved.  Headaches -ongoing for several years.  The frequency close to every other day.  It does happen when she wakes up in the morning.  Unilateral right-sided distributed over frontal and occipital region.  Resolves with Tylenol.  No blurry vision, no nausea or vomiting, no urinary incontinence or bowel incontinence.  No fevers no night sweats.  PERTINENT  PMH / PSH: Hx of Hep C, HTN, OA right knee, T2DM, HLD   OBJECTIVE:   BP (!) 156/88   Pulse 88   Ht 5\' 3"  (1.6 m)   Wt 196 lb 3.2 oz (89 kg)   SpO2 100%   BMI 34.76 kg/m   General: NAD, well appearing Neuro: A&O, Peoples equal round and reactive, no obvious papilledema, no tenderness over temporal artery Respiratory: normal WOB on RA Extremities: Moving all 4 extremities equally   ASSESSMENT/PLAN:   There are no diagnoses linked to this encounter. No follow-ups on file.  Celine Mans, MD [facility]

## 2023-02-14 ENCOUNTER — Other Ambulatory Visit: Payer: Self-pay | Admitting: Family Medicine

## 2023-02-21 ENCOUNTER — Ambulatory Visit (INDEPENDENT_AMBULATORY_CARE_PROVIDER_SITE_OTHER): Payer: Medicare HMO | Admitting: Podiatry

## 2023-02-21 ENCOUNTER — Encounter: Payer: Self-pay | Admitting: Podiatry

## 2023-02-21 DIAGNOSIS — M79674 Pain in right toe(s): Secondary | ICD-10-CM

## 2023-02-21 DIAGNOSIS — B351 Tinea unguium: Secondary | ICD-10-CM | POA: Diagnosis not present

## 2023-02-21 DIAGNOSIS — M79675 Pain in left toe(s): Secondary | ICD-10-CM | POA: Diagnosis not present

## 2023-02-21 DIAGNOSIS — E119 Type 2 diabetes mellitus without complications: Secondary | ICD-10-CM | POA: Diagnosis not present

## 2023-02-21 NOTE — Progress Notes (Signed)
This patient returns to my office for at risk foot care.  This patient requires this care by a professional since this patient will be at risk due to having type 2 diabetes.  This patient is unable to cut nails himself since the patient cannot reach his nails.These nails are painful walking and wearing shoes.  This patient presents for at risk foot care today.  General Appearance  Alert, conversant and in no acute stress.  Vascular  Dorsalis pedis and posterior tibial  pulses are palpable  bilaterally.  Capillary return is within normal limits  bilaterally. Temperature is within normal limits  bilaterally.  Neurologic  Senn-Weinstein monofilament wire test within normal limits  bilaterally. Muscle power within normal limits bilaterally.  Nails Thick disfigured discolored nails with subungual debris  from hallux to fifth toes bilaterally. No evidence of bacterial infection or drainage bilaterally.  Orthopedic  No limitations of motion  feet .  No crepitus or effusions noted.  No bony pathology or digital deformities noted.  Skin  normotropic skin with no porokeratosis noted bilaterally.  No signs of infections or ulcers noted.     Onychomycosis  Pain in right toes  Pain in left toes  Consent was obtained for treatment procedures.   Mechanical debridement of nails 1-5  bilaterally performed with a nail nipper.  Filed with dremel without incident.    Return office visit    3 months                  Told patient to return for periodic foot care and evaluation due to potential at risk complications.   Gregory Mayer DPM   

## 2023-03-08 ENCOUNTER — Other Ambulatory Visit: Payer: Self-pay | Admitting: Internal Medicine

## 2023-03-08 DIAGNOSIS — E119 Type 2 diabetes mellitus without complications: Secondary | ICD-10-CM

## 2023-03-08 DIAGNOSIS — I491 Atrial premature depolarization: Secondary | ICD-10-CM

## 2023-03-11 ENCOUNTER — Other Ambulatory Visit: Payer: Self-pay | Admitting: Internal Medicine

## 2023-03-11 DIAGNOSIS — I491 Atrial premature depolarization: Secondary | ICD-10-CM

## 2023-03-11 DIAGNOSIS — E119 Type 2 diabetes mellitus without complications: Secondary | ICD-10-CM

## 2023-03-15 ENCOUNTER — Ambulatory Visit: Payer: Medicare HMO | Admitting: Family Medicine

## 2023-03-19 DIAGNOSIS — E119 Type 2 diabetes mellitus without complications: Secondary | ICD-10-CM | POA: Diagnosis not present

## 2023-03-19 DIAGNOSIS — R32 Unspecified urinary incontinence: Secondary | ICD-10-CM | POA: Diagnosis not present

## 2023-03-29 ENCOUNTER — Encounter: Payer: Self-pay | Admitting: Family Medicine

## 2023-03-29 ENCOUNTER — Ambulatory Visit: Payer: Medicare HMO | Admitting: Family Medicine

## 2023-03-29 VITALS — BP 115/78 | HR 73 | Ht 63.0 in | Wt 193.5 lb

## 2023-03-29 DIAGNOSIS — Z8673 Personal history of transient ischemic attack (TIA), and cerebral infarction without residual deficits: Secondary | ICD-10-CM | POA: Diagnosis not present

## 2023-03-29 DIAGNOSIS — M810 Age-related osteoporosis without current pathological fracture: Secondary | ICD-10-CM | POA: Diagnosis not present

## 2023-03-29 DIAGNOSIS — Z7984 Long term (current) use of oral hypoglycemic drugs: Secondary | ICD-10-CM

## 2023-03-29 DIAGNOSIS — M79605 Pain in left leg: Secondary | ICD-10-CM

## 2023-03-29 DIAGNOSIS — Z23 Encounter for immunization: Secondary | ICD-10-CM

## 2023-03-29 DIAGNOSIS — E119 Type 2 diabetes mellitus without complications: Secondary | ICD-10-CM

## 2023-03-29 DIAGNOSIS — M60852 Other myositis, left thigh: Secondary | ICD-10-CM

## 2023-03-29 LAB — POCT GLYCOSYLATED HEMOGLOBIN (HGB A1C): HbA1c, POC (controlled diabetic range): 5.7 % (ref 0.0–7.0)

## 2023-03-29 MED ORDER — PROLIA 60 MG/ML ~~LOC~~ SOSY
PREFILLED_SYRINGE | SUBCUTANEOUS | 0 refills | Status: DC
Start: 2023-03-29 — End: 2023-11-26

## 2023-03-29 NOTE — Progress Notes (Signed)
    SUBJECTIVE:   CHIEF COMPLAINT / HPI: left leg pain  Headache log - Much improved. Very intermittent. Improves with Tylenol. Did not fill out log.  T2DM - Currently on no medications. Diet controlled.  Left leg pain - Reports that leg has been biopsied due to weakness after having COVID several years ago. Reports that Dr. Leary Roca was trying to request records. Occasionally some tingling in left leg. No pain with knee flexion, ongoing for years. Worse at end of the day. Uses ice. Worse with walking. Previously unable to tolerate compression stockings.  Still unable to fill Prolia prescription at pharmacy.  PERTINENT  PMH / PSH: HTN, T2DM, Cirrhosis, Hx of Stroke  OBJECTIVE:   BP 115/78   Pulse 73   Ht 5\' 3"  (1.6 m)   Wt 193 lb 8 oz (87.8 kg)   SpO2 99%   BMI 34.28 kg/m   General: NAD, well appearing Neuro: A&O Respiratory: normal WOB on RA Extremities: Moving all 4 extremities equally Left leg: Bilateral 1+ lumbosacral distribution, mild tenderness to palpation of calf muscles, no tenderness to palpation over tibia, medial and lateral knee joint line, patella   ASSESSMENT/PLAN:   Assessment & Plan Left leg pain Chronic, ongoing for years. Multiple factors likely playing role, OA, previous stroke, chronic peripheral swelling. Plan to continue Tylenol and Icing as needed. Counseled on returning if pain worsening. Type 2 diabetes mellitus without complication, without long-term current use of insulin (HCC) A1c 5.7 today. Continue diet controlled management. Encounter for immunization Flu vaccine administered today. Age-related osteoporosis without current pathological fracture Refilled Prolia prescription.  Myositis of left thigh, unspecified myositis type Per chart review in care everywhere, previous history of renal failure due to rhabdomyolysis in 2020 requiring hospitalization. Was supposed to follow-up with Neurology, but unable to find evidence of follow-up in  chart. Hx of stroke without residual deficits Reports previous stroke. Not currently taking aspirin. States she was taken off of aspirin. Restart aspirin 81mg  for secondary prevention.   Celine Mans, MD [facility]

## 2023-03-29 NOTE — Assessment & Plan Note (Deleted)
Hospitalized in 2020 [facility], notes in Care Everywhere.

## 2023-03-29 NOTE — Assessment & Plan Note (Addendum)
Reports previous stroke. Not currently taking aspirin. States she was taken off of aspirin. Restart aspirin 81mg  for secondary prevention.

## 2023-03-29 NOTE — Assessment & Plan Note (Signed)
A1c 5.7 today. Continue diet controlled management.

## 2023-03-29 NOTE — Assessment & Plan Note (Signed)
Per chart review in care everywhere, previous history of renal failure due to rhabdomyolysis in 2020 requiring hospitalization. Was supposed to follow-up with Neurology, but unable to find evidence of follow-up in chart.

## 2023-03-29 NOTE — Assessment & Plan Note (Signed)
Refilled Prolia prescription.

## 2023-03-29 NOTE — Patient Instructions (Addendum)
It was great to see you! Thank you for allowing me to participate in your care!  Our plans for today:  - Please continue using Ice and Tylenol as needed for pain.  - Please let me know if your leg pain is getting worse. - Your diabetes is doing well. Please continue eating healthy.   Please arrive 15 minutes PRIOR to your next scheduled appointment time! If you do not, this affects OTHER patients' care.  Take care and seek immediate care sooner if you develop any concerns.   Celine Mans, MD, PGY-2 [facility] 10:11 AM 03/29/2023  [facility]

## 2023-04-04 ENCOUNTER — Other Ambulatory Visit: Payer: Self-pay | Admitting: Family Medicine

## 2023-04-04 DIAGNOSIS — N3941 Urge incontinence: Secondary | ICD-10-CM

## 2023-04-19 DIAGNOSIS — E119 Type 2 diabetes mellitus without complications: Secondary | ICD-10-CM | POA: Diagnosis not present

## 2023-04-19 DIAGNOSIS — R32 Unspecified urinary incontinence: Secondary | ICD-10-CM | POA: Diagnosis not present

## 2023-04-26 ENCOUNTER — Other Ambulatory Visit: Payer: Self-pay | Admitting: Family Medicine

## 2023-04-26 DIAGNOSIS — M810 Age-related osteoporosis without current pathological fracture: Secondary | ICD-10-CM

## 2023-04-29 ENCOUNTER — Ambulatory Visit (INDEPENDENT_AMBULATORY_CARE_PROVIDER_SITE_OTHER): Payer: Medicare HMO | Admitting: Family Medicine

## 2023-04-29 ENCOUNTER — Telehealth: Payer: Self-pay

## 2023-04-29 ENCOUNTER — Encounter: Payer: Self-pay | Admitting: Family Medicine

## 2023-04-29 VITALS — BP 148/71 | HR 77 | Ht 63.0 in | Wt 196.4 lb

## 2023-04-29 DIAGNOSIS — I10 Essential (primary) hypertension: Secondary | ICD-10-CM

## 2023-04-29 DIAGNOSIS — Z87898 Personal history of other specified conditions: Secondary | ICD-10-CM | POA: Diagnosis not present

## 2023-04-29 DIAGNOSIS — E119 Type 2 diabetes mellitus without complications: Secondary | ICD-10-CM | POA: Diagnosis not present

## 2023-04-29 DIAGNOSIS — Z23 Encounter for immunization: Secondary | ICD-10-CM

## 2023-04-29 DIAGNOSIS — K219 Gastro-esophageal reflux disease without esophagitis: Secondary | ICD-10-CM | POA: Diagnosis not present

## 2023-04-29 DIAGNOSIS — Z7984 Long term (current) use of oral hypoglycemic drugs: Secondary | ICD-10-CM

## 2023-04-29 DIAGNOSIS — K7460 Unspecified cirrhosis of liver: Secondary | ICD-10-CM | POA: Diagnosis not present

## 2023-04-29 MED ORDER — SHINGRIX 50 MCG/0.5ML IM SUSR: INTRAMUSCULAR | 1 refills | Status: DC

## 2023-04-29 MED ORDER — FAMOTIDINE 40 MG PO TABS: 40.0000 mg | ORAL_TABLET | Freq: Every day | ORAL | 1 refills | Status: DC

## 2023-04-29 NOTE — Progress Notes (Signed)
    SUBJECTIVE:   CHIEF COMPLAINT / HPI: follow-up  Heartburn - When eating food. Feels food comes back up. Worse when she goes to sleep. Does not feel like burning. Has not taking medications before. Feeling is in center of chest going up esophagus. No difficulty swallowing. Ongoing for 1-2 months. No spicy foods or alcohol. No known foods make it worse.  Blood pressure - Taking Amlodipine. States at home is less than 140/80.   Osteoporosis - [facility] call. Reports was unable to be filled because it was filled in June and needs 6 months. Patient reports she did not receive Prolia in June.  Leg pain from previous visit resolved.  Has history of chronic peripheral edema.  Previously decrease amlodipine dose to help with this.  States that pain is improved with TED hose, however they have never been fitted for her.  PERTINENT  PMH / PSH: T2DM, HLD, HTN,   OBJECTIVE:   BP (!) 148/71   Pulse 77   Ht 5\' 3"  (1.6 m)   Wt 196 lb 6.4 oz (89.1 kg)   SpO2 99%   BMI 34.79 kg/m   General: NAD, well appearing  HEENT: Moist mucous membranes Neuro: A&O Respiratory: normal WOB on RA Abdomen: Soft, nontender, nondistended, no rebound or guarding Extremities: Moving all 4 extremities equally, 1+ nonpitting edema bilaterally   ASSESSMENT/PLAN:   Assessment & Plan Gastroesophageal reflux disease without esophagitis Symptoms consistent with acid reflux. 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 Will trial Pepcid as TUMs makes patient constipated. If not improving, low threshold for referral back to GI. -Pepcid 40mg  QHS Type 2 diabetes mellitus without complication, without long-term current use of insulin (HCC) Stable, well-controlled only on diet with diet.  Urine microalbumin today. Cirrhosis of liver without ascites, unspecified hepatic cirrhosis type (HCC) History of treated hepatitis C.  Gets 76-month surveillance ultrasounds of liver.  Scheduled today for 1 in 6 months. Need  for vaccination Shingrix vaccine printed today. 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  DME order for TED hose placed, knee-high, 10 to 20 mg of mercury. Primary hypertension Mildly elevated x 2 today.  Home measurements appropriate.  Will continue amlodipine 2.5 mg daily.  If elevated at next visit, consider increasing back to 5 mg.  Return if symptoms worsen or fail to improve.  Celine Mans, MD [facility]

## 2023-04-29 NOTE — Assessment & Plan Note (Signed)
Mildly elevated x 2 today.  Home measurements appropriate.  Will continue amlodipine 2.5 mg daily.  If elevated at next visit, consider increasing back to 5 mg.

## 2023-04-29 NOTE — Telephone Encounter (Signed)
Received message from Dr. Velna Ochs regarding order for compression socks.   As they are less than 30 mm Hg compression, DME company does not need written order.   Called niece and provided with contact information for [facility].   She will call them tomorrow to arrange time for fitting/picking up socks.   She will call back to our office if there are any issues.   Veronda Prude, RN

## 2023-04-29 NOTE — Assessment & Plan Note (Signed)
Stable, well-controlled only on diet with diet.  Urine microalbumin today.

## 2023-04-29 NOTE — Patient Instructions (Addendum)
It was great to see you! Thank you for allowing me to participate in your care!  Our plans for today:  - Please make sure to go to your abdominal ultrasound appointment in December. - Your blood pressure is doing well. - Please try taking the Pepcid once nightly to help with your reflux symptoms. If this does help, please call or message in myChart to let me know. - I have order TED hoes for you to be fitted for you leg swelling.   Please arrive 15 minutes PRIOR to your next scheduled appointment time! If you do not, this affects OTHER patients' care.  Take care and seek immediate care sooner if you develop any concerns.   Celine Mans, MD, PGY-2 [facility] 1:49 PM 04/29/2023  [facility]

## 2023-04-29 NOTE — Assessment & Plan Note (Signed)
History of treated hepatitis C.  Gets 68-month surveillance ultrasounds of liver.  Scheduled today for 1 in 6 months.

## 2023-04-30 LAB — MICROALBUMIN / CREATININE URINE RATIO
Creatinine, Urine: 62.4 mg/dL
Microalb/Creat Ratio: 34 mg/g{creat} — ABNORMAL HIGH (ref 0–29)
Microalbumin, Urine: 21.4 ug/mL

## 2023-05-21 ENCOUNTER — Other Ambulatory Visit: Payer: Self-pay | Admitting: Family Medicine

## 2023-05-21 DIAGNOSIS — K219 Gastro-esophageal reflux disease without esophagitis: Secondary | ICD-10-CM

## 2023-05-23 ENCOUNTER — Ambulatory Visit (INDEPENDENT_AMBULATORY_CARE_PROVIDER_SITE_OTHER): Payer: Medicare HMO | Admitting: Podiatry

## 2023-05-23 ENCOUNTER — Encounter: Payer: Self-pay | Admitting: Podiatry

## 2023-05-23 DIAGNOSIS — M79675 Pain in left toe(s): Secondary | ICD-10-CM

## 2023-05-23 DIAGNOSIS — M79674 Pain in right toe(s): Secondary | ICD-10-CM

## 2023-05-23 DIAGNOSIS — E119 Type 2 diabetes mellitus without complications: Secondary | ICD-10-CM

## 2023-05-23 DIAGNOSIS — B351 Tinea unguium: Secondary | ICD-10-CM | POA: Diagnosis not present

## 2023-05-23 NOTE — Progress Notes (Signed)
This patient returns to my office for at risk foot care.  This patient requires this care by a professional since this patient will be at risk due to having type 2 diabetes.  This patient is unable to cut nails himself since the patient cannot reach his nails.These nails are painful walking and wearing shoes.  This patient presents for at risk foot care today.  General Appearance  Alert, conversant and in no acute stress.  Vascular  Dorsalis pedis and posterior tibial  pulses are palpable  bilaterally.  Capillary return is within normal limits  bilaterally. Temperature is within normal limits  bilaterally.  Neurologic  Senn-Weinstein monofilament wire test within normal limits  bilaterally. Muscle power within normal limits bilaterally.  Nails Thick disfigured discolored nails with subungual debris  from hallux to fifth toes bilaterally. No evidence of bacterial infection or drainage bilaterally.  Orthopedic  No limitations of motion  feet .  No crepitus or effusions noted.  No bony pathology or digital deformities noted.  Skin  normotropic skin with no porokeratosis noted bilaterally.  No signs of infections or ulcers noted.     Onychomycosis  Pain in right toes  Pain in left toes  Consent was obtained for treatment procedures.   Mechanical debridement of nails 1-5  bilaterally performed with a nail nipper.  Filed with dremel without incident.    Return office visit    3 months                  Told patient to return for periodic foot care and evaluation due to potential at risk complications.   Brindy Higginbotham DPM   

## 2023-05-24 DIAGNOSIS — R32 Unspecified urinary incontinence: Secondary | ICD-10-CM | POA: Diagnosis not present

## 2023-05-24 DIAGNOSIS — E119 Type 2 diabetes mellitus without complications: Secondary | ICD-10-CM | POA: Diagnosis not present

## 2023-05-27 ENCOUNTER — Ambulatory Visit (HOSPITAL_COMMUNITY)
Admission: RE | Admit: 2023-05-27 | Discharge: 2023-05-27 | Disposition: A | Discharge status: Home / Self Care | Payer: Medicare HMO | Source: Ambulatory Visit | Attending: Family Medicine | Admitting: Family Medicine

## 2023-05-27 DIAGNOSIS — K7460 Unspecified cirrhosis of liver: Secondary | ICD-10-CM | POA: Insufficient documentation

## 2023-05-27 DIAGNOSIS — K7689 Other specified diseases of liver: Secondary | ICD-10-CM | POA: Diagnosis not present

## 2023-06-02 ENCOUNTER — Other Ambulatory Visit: Payer: Self-pay | Admitting: Family Medicine

## 2023-06-02 DIAGNOSIS — J302 Other seasonal allergic rhinitis: Secondary | ICD-10-CM

## 2023-06-06 ENCOUNTER — Telehealth: Payer: Self-pay

## 2023-06-06 NOTE — Telephone Encounter (Signed)
Patient calls nurse line to schedule a Prolia injection.   According to Epic last injection was 05/03/2022. She reported to PCP she did not receive an injection over the summer.   Patient had labs drawn in July 2024.  Unsure if patient will need additional labs prior to administration.   Patient does have her supply with her.   Will forward to PCP.

## 2023-06-13 ENCOUNTER — Ambulatory Visit (INDEPENDENT_AMBULATORY_CARE_PROVIDER_SITE_OTHER): Payer: Medicare HMO | Admitting: Family Medicine

## 2023-06-13 VITALS — BP 147/78 | HR 67 | Wt 196.6 lb

## 2023-06-13 DIAGNOSIS — K219 Gastro-esophageal reflux disease without esophagitis: Secondary | ICD-10-CM

## 2023-06-13 DIAGNOSIS — M810 Age-related osteoporosis without current pathological fracture: Secondary | ICD-10-CM

## 2023-06-13 MED ORDER — OMEPRAZOLE 20 MG PO CPDR: 20.0000 mg | DELAYED_RELEASE_CAPSULE | Freq: Every day | ORAL | 3 refills | Status: DC

## 2023-06-13 NOTE — Assessment & Plan Note (Signed)
Has not had prolia injection since last December. Needs to restart with initial labs. Mag, Phos, CMP, Vitamin D ordered. Pending labs, will then schedule for nurse visit to perform injection. Will need repeat calcium 2 weeks after administration.

## 2023-06-13 NOTE — Progress Notes (Signed)
    SUBJECTIVE:   CHIEF COMPLAINT / HPI: osteoporosis f/u  Presents today with her niece who as usual is her preferred interpreter.  Acid Reflux - Pepcid and Tums did not help. Feels like she is having increased fullness in the morning, and after she eats. No vomiting. Burping often, especially before she goes to sleep.  Here to discuss prolia injection and labs. Due to error with pharmacy in summer, did not get last injection.  PERTINENT  PMH / PSH: Osteoporosis.  OBJECTIVE:   BP (!) 147/78   Pulse 67   Wt 196 lb 9.6 oz (89.2 kg)   SpO2 100%   BMI 34.83 kg/m   General: NAD, well appearing Neuro: A&O Respiratory: normal WOB on RA Extremities: Moving all 4 extremities equally   ASSESSMENT/PLAN:   Assessment & Plan Gastroesophageal reflux disease without esophagitis GERD in the setting of previous strongyloides esophagitis and gastritis requiring hospitalization. Per previous plan from visit with me on 11/11 will refer to GI for further evaluation given non classic symptomatology and history. Patient agreeable to additionally trialing Omeprazole 20mg  daily which I have sent to her pharmacy Age related osteoporosis, unspecified pathological fracture presence Has not had prolia injection since last December. Needs to restart with initial labs. Mag, Phos, CMP, Vitamin D ordered. Pending labs, will then schedule for nurse visit to perform injection. Will need repeat calcium 2 weeks after administration.   Return in about 4 weeks (around 07/11/2023).  Celine Mans, MD [facility]

## 2023-06-13 NOTE — Patient Instructions (Addendum)
It was great to see you! Thank you for allowing me to participate in your care!  Our plans for today:  - Please try taking Omeprazole twice daily. - I have placed a referral to GI to evaluate your fullness, they should call you schedule this appointment. - I will let you know the results of your labs.   Please arrive 15 minutes PRIOR to your next scheduled appointment time! If you do not, this affects OTHER patients' care.  Take care and seek immediate care sooner if you develop any concerns.   Celine Mans, MD, PGY-2 [facility] 3:33 PM 06/13/2023  [facility]

## 2023-06-14 ENCOUNTER — Telehealth: Payer: Self-pay | Admitting: Family Medicine

## 2023-06-14 DIAGNOSIS — M810 Age-related osteoporosis without current pathological fracture: Secondary | ICD-10-CM

## 2023-06-14 LAB — COMPREHENSIVE METABOLIC PANEL
ALT: 12 [IU]/L (ref 0–32)
AST: 19 [IU]/L (ref 0–40)
Albumin: 4.5 g/dL (ref 3.8–4.8)
Alkaline Phosphatase: 71 [IU]/L (ref 44–121)
BUN/Creatinine Ratio: 22 (ref 12–28)
BUN: 15 mg/dL (ref 8–27)
Bilirubin Total: 0.3 mg/dL (ref 0.0–1.2)
CO2: 21 mmol/L (ref 20–29)
Calcium: 9.6 mg/dL (ref 8.7–10.3)
Chloride: 99 mmol/L (ref 96–106)
Creatinine, Ser: 0.69 mg/dL (ref 0.57–1.00)
Globulin, Total: 2.9 g/dL (ref 1.5–4.5)
Glucose: 88 mg/dL (ref 70–99)
Potassium: 4.1 mmol/L (ref 3.5–5.2)
Sodium: 139 mmol/L (ref 134–144)
Total Protein: 7.4 g/dL (ref 6.0–8.5)
eGFR: 91 mL/min/{1.73_m2} (ref >59)

## 2023-06-14 LAB — MAGNESIUM: Magnesium: 2 mg/dL (ref 1.6–2.3)

## 2023-06-14 LAB — VITAMIN D 25 HYDROXY (VIT D DEFICIENCY, FRACTURES): Vit D, 25-Hydroxy: 35.8 ng/mL (ref 30.0–100.0)

## 2023-06-14 LAB — PHOSPHORUS: Phosphorus: 3.2 mg/dL (ref 3.0–4.3)

## 2023-06-14 NOTE — Telephone Encounter (Signed)
Called patient discussed lab results.  Grossly normal mag, Phos, CMP with normal calcium.  Discussed this with patient's niece Tracey Riddle, who is patient's primary point of contact.  Discussed that patient is cleared to restart Prolia injections based on lab results.  Instructed to call clinic to schedule for nurse visit next week for the injection.  She is agreeable to the plan.  Discussed that this Tracey Riddle will need a repeat calcium 1 week after her injection.  I have placed this order and she states she will schedule that at the nurse visit appointment for injection.

## 2023-06-16 ENCOUNTER — Other Ambulatory Visit: Payer: Self-pay | Admitting: Family Medicine

## 2023-06-16 DIAGNOSIS — K219 Gastro-esophageal reflux disease without esophagitis: Secondary | ICD-10-CM

## 2023-06-16 DIAGNOSIS — M545 Low back pain, unspecified: Secondary | ICD-10-CM

## 2023-06-16 DIAGNOSIS — J302 Other seasonal allergic rhinitis: Secondary | ICD-10-CM

## 2023-06-16 DIAGNOSIS — M479 Spondylosis, unspecified: Secondary | ICD-10-CM

## 2023-06-17 ENCOUNTER — Ambulatory Visit: Payer: Medicare HMO

## 2023-06-17 VITALS — BP 142/78 | HR 73 | Temp 98.0°F

## 2023-06-17 DIAGNOSIS — M810 Age-related osteoporosis without current pathological fracture: Secondary | ICD-10-CM

## 2023-06-17 MED ORDER — DENOSUMAB 60 MG/ML ~~LOC~~ SOSY
60.0000 mg | PREFILLED_SYRINGE | Freq: Once | SUBCUTANEOUS | Status: AC
  Administered 2023-06-17: 60 mg via SUBCUTANEOUS

## 2023-06-21 ENCOUNTER — Other Ambulatory Visit: Payer: Self-pay | Admitting: Internal Medicine

## 2023-06-21 ENCOUNTER — Other Ambulatory Visit: Payer: Self-pay | Admitting: Family Medicine

## 2023-06-21 DIAGNOSIS — R32 Unspecified urinary incontinence: Secondary | ICD-10-CM | POA: Diagnosis not present

## 2023-06-21 DIAGNOSIS — M479 Spondylosis, unspecified: Secondary | ICD-10-CM

## 2023-06-21 DIAGNOSIS — E119 Type 2 diabetes mellitus without complications: Secondary | ICD-10-CM | POA: Diagnosis not present

## 2023-06-21 DIAGNOSIS — M545 Low back pain, unspecified: Secondary | ICD-10-CM

## 2023-06-21 DIAGNOSIS — I491 Atrial premature depolarization: Secondary | ICD-10-CM

## 2023-06-24 ENCOUNTER — Telehealth: Payer: Self-pay

## 2023-06-24 ENCOUNTER — Other Ambulatory Visit: Payer: Medicare HMO

## 2023-06-24 ENCOUNTER — Other Ambulatory Visit: Payer: Self-pay

## 2023-06-24 DIAGNOSIS — E119 Type 2 diabetes mellitus without complications: Secondary | ICD-10-CM

## 2023-06-24 DIAGNOSIS — I491 Atrial premature depolarization: Secondary | ICD-10-CM

## 2023-06-24 DIAGNOSIS — M810 Age-related osteoporosis without current pathological fracture: Secondary | ICD-10-CM | POA: Diagnosis not present

## 2023-06-24 NOTE — Telephone Encounter (Signed)
 Contacted patient and informed her to send message to Dr. Velna Ochs concerning why her RX was refused.  Patient agreed to send message to provider.   Glennie Hawk, CMA]

## 2023-06-25 ENCOUNTER — Encounter: Payer: Self-pay | Admitting: Internal Medicine

## 2023-06-25 ENCOUNTER — Other Ambulatory Visit: Payer: Self-pay | Admitting: Internal Medicine

## 2023-06-25 DIAGNOSIS — I491 Atrial premature depolarization: Secondary | ICD-10-CM

## 2023-06-25 DIAGNOSIS — E119 Type 2 diabetes mellitus without complications: Secondary | ICD-10-CM

## 2023-06-25 LAB — CALCIUM: Calcium: 9.1 mg/dL (ref 8.7–10.3)

## 2023-06-26 ENCOUNTER — Telehealth: Payer: Self-pay | Admitting: Internal Medicine

## 2023-06-26 DIAGNOSIS — E119 Type 2 diabetes mellitus without complications: Secondary | ICD-10-CM

## 2023-06-26 DIAGNOSIS — I491 Atrial premature depolarization: Secondary | ICD-10-CM

## 2023-06-26 MED ORDER — CARVEDILOL 3.125 MG PO TABS: 3.1250 mg | ORAL_TABLET | Freq: Two times a day (BID) | ORAL | 1 refills | Status: DC

## 2023-06-26 NOTE — Telephone Encounter (Signed)
 Pt's medication was sent to pt's pharmacy as requested. Confirmation received.

## 2023-06-26 NOTE — Telephone Encounter (Signed)
*  STAT* If patient is at the pharmacy, call can be transferred to refill team.   1. Which medications need to be refilled? (please list name of each medication and dose if known)   carvedilol  (COREG ) 3.125 MG tablet    2. Which pharmacy/location (including street and city if local pharmacy) is medication to be sent to? [id number] [place] [place] - [address]   3. Do they need a 30 day or 90 day supply? 90  Completely out Has pending 2/25 @ 1:30 with Lelon

## 2023-07-05 ENCOUNTER — Encounter: Payer: Self-pay | Admitting: Family Medicine

## 2023-07-16 ENCOUNTER — Ambulatory Visit: Payer: Medicare HMO | Admitting: Family Medicine

## 2023-07-16 ENCOUNTER — Encounter: Payer: Self-pay | Admitting: Family Medicine

## 2023-07-16 DIAGNOSIS — N3941 Urge incontinence: Secondary | ICD-10-CM

## 2023-07-16 DIAGNOSIS — J302 Other seasonal allergic rhinitis: Secondary | ICD-10-CM

## 2023-07-16 MED ORDER — FESOTERODINE FUMARATE ER 8 MG PO TB24: 8.0000 mg | ORAL_TABLET | Freq: Every day | ORAL | 3 refills | Status: DC

## 2023-07-16 MED ORDER — FEXOFENADINE HCL 60 MG PO TABS: 60.0000 mg | ORAL_TABLET | ORAL | 2 refills | Status: DC | PRN

## 2023-07-16 NOTE — Patient Instructions (Signed)
It was great to see you! Thank you for allowing me to participate in your care!  Our plans for today:  - I have faxed   Please arrive 15 minutes PRIOR to your next scheduled appointment time! If you do not, this affects OTHER patients' care.  Take care and seek immediate care sooner if you develop any concerns.   Celine Mans, MD, PGY-2 [facility] 4:21 PM 07/16/2023  [facility]

## 2023-07-16 NOTE — Assessment & Plan Note (Signed)
Requesting refill of Allegra.

## 2023-07-16 NOTE — Progress Notes (Signed)
    SUBJECTIVE:   CHIEF COMPLAINT / HPI:   Presents today with niece who serves as Equities trader.  Has been unable to control bladder for years. History of urge incontinence and overactive. Currently uses diapers 24hrs per day. Does not feel like Tracey Riddle is helping.  Cannot make it to bathroom still.  Reports previously been to urology at [facility]. Nothing helped there.  PERTINENT  PMH / PSH: Overactive bladder.  OBJECTIVE:   BP (!) 154/75   Pulse 75   Ht 5\' 3"  (1.6 m)   Wt 196 lb 4 oz (89 kg)   SpO2 99%   BMI 34.76 kg/m   General: NAD, well appearing Neuro: A&O Respiratory: normal WOB on RA Extremities: Moving all 4 extremities equally    ASSESSMENT/PLAN:   Assessment & Plan Urge incontinence of urine Chronic known urge incontinence.  Presenting today for office visit for documentation purposes to send to incontinence supply store.  Provided refill of Toviaz.  Urge incontinence is chronic and stable at this time.  Will fax office note to [employer]. Seasonal allergies Requesting refill of Allegra.  No follow-ups on file.  Tracey Mans, MD [facility]

## 2023-08-05 DIAGNOSIS — N3941 Urge incontinence: Secondary | ICD-10-CM | POA: Diagnosis not present

## 2023-08-05 DIAGNOSIS — R32 Unspecified urinary incontinence: Secondary | ICD-10-CM | POA: Diagnosis not present

## 2023-08-05 DIAGNOSIS — E119 Type 2 diabetes mellitus without complications: Secondary | ICD-10-CM | POA: Diagnosis not present

## 2023-08-13 ENCOUNTER — Ambulatory Visit: Payer: Medicare HMO | Attending: Physician Assistant | Admitting: Emergency Medicine

## 2023-08-13 ENCOUNTER — Encounter: Payer: Self-pay | Admitting: Physician Assistant

## 2023-08-13 VITALS — BP 144/86 | HR 73 | Ht 67.0 in | Wt 196.0 lb

## 2023-08-13 DIAGNOSIS — I471 Supraventricular tachycardia, unspecified: Secondary | ICD-10-CM | POA: Diagnosis not present

## 2023-08-13 DIAGNOSIS — E119 Type 2 diabetes mellitus without complications: Secondary | ICD-10-CM

## 2023-08-13 DIAGNOSIS — I491 Atrial premature depolarization: Secondary | ICD-10-CM | POA: Diagnosis not present

## 2023-08-13 DIAGNOSIS — I340 Nonrheumatic mitral (valve) insufficiency: Secondary | ICD-10-CM

## 2023-08-13 DIAGNOSIS — I10 Essential (primary) hypertension: Secondary | ICD-10-CM

## 2023-08-13 DIAGNOSIS — I351 Nonrheumatic aortic (valve) insufficiency: Secondary | ICD-10-CM | POA: Diagnosis not present

## 2023-08-13 MED ORDER — AMLODIPINE BESYLATE 5 MG PO TABS: 5.0000 mg | ORAL_TABLET | Freq: Every day | ORAL | 3 refills | Status: DC

## 2023-08-13 MED ORDER — CARVEDILOL 3.125 MG PO TABS: 3.1250 mg | ORAL_TABLET | Freq: Two times a day (BID) | ORAL | 3 refills | Status: AC

## 2023-08-13 NOTE — Patient Instructions (Signed)
 Medication Instructions:  Your physician has recommended you make the following change in your medication:   INCREASE Amlodipine to 5 mg taking 1 daily  *If you need a refill on your cardiac medications before your next appointment, please call your pharmacy*   Lab Work: None ordered  If you have labs (blood work) drawn today and your tests are completely normal, you will receive your results only by: MyChart Message (if you have MyChart) OR A paper copy in the mail If you have any lab test that is abnormal or we need to change your treatment, we will call you to review the results.   Testing/Procedures: None ordered   Follow-Up: At [facility], you and your health needs are our priority.  As part of our continuing mission to provide you with exceptional heart care, we have created designated Provider Care Teams.  These Care Teams include your primary Cardiologist (physician) and Advanced Practice Providers (APPs -  Physician Assistants and Nurse Practitioners) who all work together to provide you with the care you need, when you need it.  We recommend signing up for the patient portal called "MyChart".  Sign up information is provided on this After Visit Summary.  MyChart is used to connect with patients for Virtual Visits (Telemedicine).  Patients are able to view lab/test results, encounter notes, upcoming appointments, etc.  Non-urgent messages can be sent to your provider as well.   To learn more about what you can do with MyChart, go to [email or web address].    Your next appointment:   3 month(s)  Provider:   Rise Paganini, NP         Other Instructions    1st Floor: - Lobby - Registration  - Pharmacy  - Lab - Cafe  2nd Floor: - PV Lab - Diagnostic Testing (echo, CT, nuclear med)  3rd Floor: - Vacant  4th Floor: - TCTS (cardiothoracic surgery) - AFib Clinic - Structural Heart Clinic - Vascular Surgery  - Vascular Ultrasound  5th Floor: -  HeartCare Cardiology (general and EP) - Clinical Pharmacy for coumadin, hypertension, lipid, weight-loss medications, and med management appointments    Valet parking services will be available as well.

## 2023-08-13 NOTE — Progress Notes (Signed)
 Cardiology Office Note:    Date:  08/13/2023  ID:  Tracey Riddle, DOB 06/10/1951, MRN [id number] PCP: Celine Mans, MD  [facility] Providers Cardiologist:  Christell Constant, MD       Patient Profile:      Tracey Riddle is a 73 y.o. female with visit-pertinent history of T2DM, hypertension, GERD, PACs, SVT, mild AR and MR  She established care with cardiology service on 04/2020 for dizziness and palpitations.  Zio patch was ordered and completed on 06/06/2020 showing occasional PACs, SVT, no malignant arrhythmias.  Triggered events were associated with PACs.  She was started on carvedilol 3.125 mg twice daily.  She was last seen in clinic on 01/17/2022.  She had no cardiovascular concerns or complaints at that time.  No medication changes were made, she was to follow-up in 1 year.      History of Present Illness:  Discussed the use of AI scribe software for clinical note transcription with the patient, who gave verbal consent to proceed.  Tracey Riddle is a 73 y.o. female who returns for 1 year follow-up for palpitations, PVCs, SVT.  The patient arrives to clinic today with her niece who is the primary interpreter during today's visit.  The patient reports she has been generally well.  Today she is without any cardiovascular concerns or complaints.  She also notes over the last year and a half she has had no major concerns.  Her blood pressure readings at home are mostly around 140/80. However, the patient has been experiencing frequent headaches, which are a new symptom over the past month or so. The patient also reports feeling dizzy when her headaches worsen.  She is not sure if her headaches are associated with higher blood pressures at home.  The patient notes that her heart rate will increase when she exercises.  She notes that she experiences fatigue more easily over the past several months after she walks.  She denies any dyspnea, DOE, orthopnea, leg swelling, chest  pain, exertional angina, syncope, near syncope.    Review of Systems  Constitutional: Negative for weight gain and weight loss.  Cardiovascular:  Negative for chest pain, claudication, dyspnea on exertion, irregular heartbeat, leg swelling, near-syncope, orthopnea, palpitations, paroxysmal nocturnal dyspnea and syncope.  Respiratory:  Negative for shortness of breath.   Neurological:  Positive for dizziness and headaches. Negative for light-headedness.     See HPI     Home Medications:    Prior to Admission medications   Medication Sig Start Date End Date Taking? Authorizing Provider  acetaminophen (TYLENOL) 500 MG tablet Take 2 tablets by mouth every 8 (eight) hours as needed for mild pain, moderate pain or headache.    [provider]  amLODipine (NORVASC) 2.5 MG tablet Take 1 tablet (2.5 mg total) by mouth at bedtime. 11/30/22   Celine Mans, MD  atorvastatin (LIPITOR) 20 MG tablet TAKE 1 TABLET BY MOUTH DAILY AT 6 PM. 02/08/23   Celine Mans, MD  Calcium Carb-Cholecalciferol (OYSTER SHELL CALCIUM W/D) 500-5 MG-MCG TABS TAKE 2 TABLETS BY MOUTH EVERY DAY WITH BREAKFAST 04/26/23   Celine Mans, MD  carvedilol (COREG) 3.125 MG tablet Take 1 tablet (3.125 mg total) by mouth 2 (two) times daily with a meal. 06/26/23   Chandrasekhar, Mahesh A, MD  denosumab (PROLIA) 60 MG/ML SOSY injection INJECT 1 SYRINGE UNDER THE SKIN ONCE EVERY 6 MONTHS 03/29/23   Celine Mans, MD  famotidine (PEPCID) 40 MG tablet TAKE 1 TABLET BY MOUTH EVERY DAY  05/21/23   Celine Mans, MD  fesoterodine (TOVIAZ) 8 MG TB24 tablet Take 1 tablet (8 mg total) by mouth daily. 07/16/23   Celine Mans, MD  fexofenadine [facility] ALLER[facility]) 60 MG tablet Take 1 tablet (60 mg total) by mouth as needed. 07/16/23   Celine Mans, MD  fluticasone [facility]) 50 MCG/ACT nasal spray SPRAY 2 SPRAYS INTO EACH NOSTRIL EVERY DAY 06/17/23   Elberta Fortis, MD  omeprazole (PRILOSEC) 20 MG capsule TAKE 1 CAPSULE BY  MOUTH EVERY DAY 06/17/23   Elberta Fortis, MD  ondansetron (ZOFRAN-ODT) 4 MG disintegrating tablet Take 4 mg by mouth 3 (three) times daily as needed. 12/29/19   [provider]  sennosides-docusate sodium (SENOKOT-S) 8.6-50 MG tablet Take 1 tablet by mouth daily. 11/20/19   Shirlean Mylar, MD  Zoster Vaccine Adjuvanted [facility]) injection Administer Shingrix vaccination now and repeat in two months Patient not taking: Reported on 06/13/2023 04/29/23   Celine Mans, MD   Studies Reviewed:   EKG Interpretation Date/Time:  Tuesday August 13 2023 13:32:57 EST Ventricular Rate:  73 PR Interval:  158 QRS Duration:  72 QT Interval:  222 QTC Calculation: 244 R Axis:   11  Text Interpretation: Normal sinus rhythm Minimal voltage criteria for LVH, may be normal variant ( R in aVL ) Confirmed by Rise Paganini [phone]) on 08/13/2023 5:10:10 PM    Echocardiogram 09/06/2021 1. Left ventricular ejection fraction, by estimation, is 60 to 65%. The  left ventricle has normal function. The left ventricle has no regional  wall motion abnormalities. Left ventricular diastolic parameters are  consistent with Grade I diastolic  dysfunction (impaired relaxation).   2. Right ventricular systolic function is normal. The right ventricular  size is normal. There is normal pulmonary artery systolic pressure.   3. Left atrial size was moderately dilated.   4. The mitral valve is grossly normal. Mild mitral valve regurgitation.   5. The aortic valve is normal in structure. Aortic valve regurgitation is  mild.   6. The inferior vena cava is normal in size with greater than 50%  respiratory variability, suggesting right atrial pressure of 3 mmHg.   7. Cannot exclude a small PFO.   ZIO 06/06/2020 Patient had a minimum heart rate of 60 bpm, maximum heart rate of 184 bpm, and average heart rate of 85 bpm. Predominant underlying rhythm was sinus rhythm. One run of ventricular tachycardia occurred  lasting 4 beats with a max rate of 146 bpm. Ten runs of supraventricular tachycardia occurred lasting 12 beats at longest with a max rate of 184 bpm. Isolated PACs were occasional (1.3%), with rare couplets and triplets present. Isolated PVCs were rare (<1.0%), with rare couplets and bigeminy present. No evidence of complete heart block. Triggered and diary events associated with sinus rhythm and PACs.  Risk Assessment/Calculations:     HYPERTENSION CONTROL Vitals:   08/13/23 1325 08/13/23 1422  BP: (!) 142/80 (!) 144/86    The patient's blood pressure is elevated above target today.  In order to address the patient's elevated BP: A current anti-hypertensive medication was adjusted today.          Physical Exam:   VS:  BP (!) 144/86 (BP Location: Right Arm, Patient Position: Sitting, Cuff Size: Large)   Pulse 73   Ht 5\' 7"  (1.702 m)   Wt 196 lb (88.9 kg)   SpO2 98%   BMI 30.70 kg/m    Wt Readings from Last 3 Encounters:  08/13/23 196 lb (88.9 kg)  07/16/23  196 lb 4 oz (89 kg)  06/13/23 196 lb 9.6 oz (89.2 kg)    Constitutional:      Appearance: Normal and healthy appearance. Not in distress.  HENT:     Head: Normocephalic.  Neck:     Vascular: JVD normal.  Pulmonary:     Effort: Pulmonary effort is normal.     Breath sounds: Normal breath sounds.  Chest:     Chest wall: Not tender to palpatation.  Cardiovascular:     PMI at left midclavicular line. Normal rate. Regular rhythm. Normal S1. Normal S2.      Murmurs: There is no murmur.     No gallop.  No click. No rub.  Pulses:    Intact distal pulses.  Edema:    Peripheral edema absent.  Musculoskeletal: Normal range of motion.     Cervical back: Normal range of motion and neck supple. Skin:    General: Skin is warm and dry.  Neurological:     General: No focal deficit present.     Mental Status: Alert, oriented to person, place, and time and oriented to person, place and time.  Psychiatric:        Mood and  Affect: Mood and affect normal.        Behavior: Behavior is cooperative.        Thought Content: Thought content normal.        Assessment and Plan:  Hypertension Blood pressure today 142/80 and repeat 144/86.  Currently not under good control and over her goal of less than 130/80.  She does note her blood pressure at home ranges in the ~140s.  She does endorse headaches but denies any anginal symptoms -I will increase amlodipine from 2.5 mg to 5 mg -She will get begin to monitor BP at home more frequently.  Monitor for leg swelling as potential side effect of increased dose of amlodipine. -Start amlodipine 5 mg daily and continue carvedilol 3.125 mg twice daily -Recommend DASH diet (high in vegetables, fruits, low-fat dairy products, whole grains, poultry, fish, and nuts and low in sweets, sugar-sweetened beverages, and red meats), salt restriction and increase physical activity.   PACs/SVT ZIO 05/2020 showed occasional PACs (1.3%) and 10 runs of SVT lasting 12 beats at longest with max rate of 184 bpm.  Her triggered events associated with sinus rhythm/PACs. Quiescent. Her palpitations are under good control on beta-blocker therapy. 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  Signed, Denyce Robert, NP

## 2023-08-21 ENCOUNTER — Encounter: Payer: Self-pay | Admitting: Podiatry

## 2023-08-21 ENCOUNTER — Ambulatory Visit (INDEPENDENT_AMBULATORY_CARE_PROVIDER_SITE_OTHER): Payer: Medicare HMO | Admitting: Podiatry

## 2023-08-21 DIAGNOSIS — B351 Tinea unguium: Secondary | ICD-10-CM | POA: Diagnosis not present

## 2023-08-21 DIAGNOSIS — M79675 Pain in left toe(s): Secondary | ICD-10-CM

## 2023-08-21 DIAGNOSIS — M79674 Pain in right toe(s): Secondary | ICD-10-CM | POA: Diagnosis not present

## 2023-08-21 DIAGNOSIS — E119 Type 2 diabetes mellitus without complications: Secondary | ICD-10-CM

## 2023-08-21 NOTE — Progress Notes (Signed)
 This patient returns to my office for at risk foot care.  This patient requires this care by a professional since this patient will be at risk due to having type 2 diabetes.  This patient is unable to cut nails himself since the patient cannot reach his nails.These nails are painful walking and wearing shoes.  This patient presents for at risk foot care today.  General Appearance  Alert, conversant and in no acute stress.  Vascular  Dorsalis pedis and posterior tibial  pulses are palpable  bilaterally.  Capillary return is within normal limits  bilaterally. Temperature is within normal limits  bilaterally.  Neurologic  Senn-Weinstein monofilament wire test within normal limits  bilaterally. Muscle power within normal limits bilaterally.  Nails Thick disfigured discolored nails with subungual debris  from hallux to fifth toes bilaterally. No evidence of bacterial infection or drainage bilaterally.  Orthopedic  No limitations of motion  feet .  No crepitus or effusions noted.  No bony pathology or digital deformities noted.  Skin  normotropic skin with no porokeratosis noted bilaterally.  No signs of infections or ulcers noted.     Onychomycosis  Pain in right toes  Pain in left toes  Consent was obtained for treatment procedures.   Mechanical debridement of nails 1-5  bilaterally performed with a nail nipper.  Filed with dremel without incident.    Return office visit    3 months                 Told patient to return for periodic foot care and evaluation due to potential at risk complications.   Helane Gunther DPM

## 2023-08-26 DIAGNOSIS — E119 Type 2 diabetes mellitus without complications: Secondary | ICD-10-CM | POA: Diagnosis not present

## 2023-08-26 DIAGNOSIS — R32 Unspecified urinary incontinence: Secondary | ICD-10-CM | POA: Diagnosis not present

## 2023-08-26 DIAGNOSIS — N3941 Urge incontinence: Secondary | ICD-10-CM | POA: Diagnosis not present

## 2023-11-12 ENCOUNTER — Ambulatory Visit: Payer: Medicare HMO | Admitting: Emergency Medicine

## 2023-11-12 ENCOUNTER — Other Ambulatory Visit: Payer: Self-pay | Admitting: Family Medicine

## 2023-11-12 DIAGNOSIS — M810 Age-related osteoporosis without current pathological fracture: Secondary | ICD-10-CM

## 2023-11-12 NOTE — Progress Notes (Deleted)
  Cardiology Office Note:    Date:  11/12/2023  ID:  Merilyn Pagan, DOB Aug 23, 1950, MRN [id number] PCP: Ivin Marrow, MD  [facility] Providers Cardiologist:  Jann Melody, MD { Click to update primary MD,subspecialty MD or APP then REFRESH:1}    {Click to Open Review  :1}   Patient Profile:       Chief Complaint: *** History of Present Illness:  Tracey Riddle is a 73 y.o. female with visit-pertinent history of T2DM, hypertension, GERD, PACs, SVT, mild AR and MR   She established care with cardiology service on 04/2020 for dizziness and palpitations.  Zio patch was ordered and completed on 06/06/2020 showing occasional PACs, SVT, no malignant arrhythmias.  Triggered events were associated with PACs.  She was started on carvedilol  3.125 mg twice daily.  Last seen in clinic on 08/13/2023.  Her blood pressure was elevated at 142/80 and repeat 144/86.  Her amlodipine  was increased to 5 mg daily from 2.5 mg.  She was continued on carvedilol  3.125 mg twice daily.  Discussed the use of AI scribe software for clinical note transcription with the patient, who gave verbal consent to proceed.  History of Present Illness     Review of systems:  Please see the history of present illness. All other systems are reviewed and otherwise negative. ***      Studies Reviewed:        ***  Risk Assessment/Calculations:   {Does this patient have ATRIAL FIBRILLATION?:[id number]} No BP recorded.  {Refresh Note OR Click here to enter BP  :1}***        Physical Exam:   VS:  There were no vitals taken for this visit.   Wt Readings from Last 3 Encounters:  08/13/23 196 lb (88.9 kg)  07/16/23 196 lb 4 oz (89 kg)  06/13/23 196 lb 9.6 oz (89.2 kg)    GEN: Well nourished, well developed in no acute distress NECK: No JVD; No carotid bruits CARDIAC: ***RRR, no murmurs, rubs, gallops RESPIRATORY:  Clear to auscultation without rales, wheezing or rhonchi  ABDOMEN: Soft,  non-tender, non-distended EXTREMITIES:  No edema; No acute deformity ***      Assessment and Plan:  Assessment and Plan Assessment & Plan      {Are you ordering a CV Procedure (e.g. stress test, cath, DCCV, TEE, etc)?   Press F2        :[id number]}  Dispo:  No follow-ups on file.  Signed, Ava Boatman, NP

## 2023-11-14 ENCOUNTER — Telehealth: Payer: Self-pay

## 2023-11-14 ENCOUNTER — Other Ambulatory Visit (HOSPITAL_COMMUNITY): Payer: Self-pay

## 2023-11-14 NOTE — Telephone Encounter (Signed)
 Pharmacy Patient Advocate Encounter  Insurance verification completed.   The patient is insured through [employer]   Ran test claim for PROLIA . Currently a quantity of 1 ML is a 180 day supply and the co-pay is $4.80 . The current 180 day co-pay is, $4.80.  No PA needed at this time.  This test claim was processed through [facility]- copay amounts may vary at other pharmacies due to pharmacy/plan contracts, or as the patient moves through the different stages of their insurance plan.

## 2023-11-15 ENCOUNTER — Other Ambulatory Visit: Payer: Self-pay | Admitting: Family Medicine

## 2023-11-15 DIAGNOSIS — E1169 Type 2 diabetes mellitus with other specified complication: Secondary | ICD-10-CM

## 2023-11-15 DIAGNOSIS — K219 Gastro-esophageal reflux disease without esophagitis: Secondary | ICD-10-CM

## 2023-11-20 ENCOUNTER — Encounter: Payer: Self-pay | Admitting: Family Medicine

## 2023-11-20 ENCOUNTER — Encounter: Payer: Self-pay | Admitting: Podiatry

## 2023-11-20 ENCOUNTER — Ambulatory Visit (INDEPENDENT_AMBULATORY_CARE_PROVIDER_SITE_OTHER): Admitting: Podiatry

## 2023-11-20 ENCOUNTER — Other Ambulatory Visit: Payer: Self-pay | Admitting: Family Medicine

## 2023-11-20 DIAGNOSIS — E119 Type 2 diabetes mellitus without complications: Secondary | ICD-10-CM

## 2023-11-20 DIAGNOSIS — M79674 Pain in right toe(s): Secondary | ICD-10-CM | POA: Diagnosis not present

## 2023-11-20 DIAGNOSIS — B351 Tinea unguium: Secondary | ICD-10-CM

## 2023-11-20 DIAGNOSIS — M810 Age-related osteoporosis without current pathological fracture: Secondary | ICD-10-CM

## 2023-11-20 DIAGNOSIS — M79675 Pain in left toe(s): Secondary | ICD-10-CM | POA: Diagnosis not present

## 2023-11-20 NOTE — Progress Notes (Signed)
 This patient returns to my office for at risk foot care.  This patient requires this care by a professional since this patient will be at risk due to having type 2 diabetes.  This patient is unable to cut nails himself since the patient cannot reach his nails.These nails are painful walking and wearing shoes.  This patient presents for at risk foot care today.  General Appearance  Alert, conversant and in no acute stress.  Vascular  Dorsalis pedis and posterior tibial  pulses are palpable  bilaterally.  Capillary return is within normal limits  bilaterally. Temperature is within normal limits  bilaterally.  Neurologic  Senn-Weinstein monofilament wire test within normal limits  bilaterally. Muscle power within normal limits bilaterally.  Nails Thick disfigured discolored nails with subungual debris  from hallux to fifth toes bilaterally. No evidence of bacterial infection or drainage bilaterally.  Orthopedic  No limitations of motion  feet .  No crepitus or effusions noted.  No bony pathology or digital deformities noted.  Skin  normotropic skin with no porokeratosis noted bilaterally.  No signs of infections or ulcers noted.     Onychomycosis  Pain in right toes  Pain in left toes  Consent was obtained for treatment procedures.   Mechanical debridement of nails 1-5  bilaterally performed with a nail nipper.  Filed with dremel without incident.    Return office visit    3 months                 Told patient to return for periodic foot care and evaluation due to potential at risk complications.   Helane Gunther DPM

## 2023-11-20 NOTE — Progress Notes (Signed)
 Labs ordered for monitoring before next prolia  shot.

## 2023-11-24 ENCOUNTER — Other Ambulatory Visit: Payer: Self-pay | Admitting: Family Medicine

## 2023-11-24 DIAGNOSIS — J302 Other seasonal allergic rhinitis: Secondary | ICD-10-CM

## 2023-11-25 ENCOUNTER — Other Ambulatory Visit

## 2023-11-25 DIAGNOSIS — M810 Age-related osteoporosis without current pathological fracture: Secondary | ICD-10-CM

## 2023-11-26 ENCOUNTER — Ambulatory Visit: Payer: Self-pay | Admitting: Family Medicine

## 2023-11-26 LAB — VITAMIN D 25 HYDROXY (VIT D DEFICIENCY, FRACTURES): Vit D, 25-Hydroxy: 32.5 ng/mL (ref 30.0–100.0)

## 2023-11-26 LAB — PHOSPHORUS: Phosphorus: 3 mg/dL (ref 3.0–4.3)

## 2023-11-26 LAB — CALCIUM: Calcium: 9.3 mg/dL (ref 8.7–10.3)

## 2023-11-26 NOTE — Telephone Encounter (Signed)
 Per pharmacy encounter, the patient can have this filled at [facility] for a copay of ~ 4 dollars.   Patient will bring supply to RN visit for injection.   Please send to [facility].

## 2023-12-10 ENCOUNTER — Encounter: Payer: Self-pay | Admitting: Emergency Medicine

## 2023-12-10 ENCOUNTER — Ambulatory Visit: Attending: Emergency Medicine | Admitting: Emergency Medicine

## 2023-12-10 VITALS — BP 120/64 | HR 75 | Ht 63.0 in

## 2023-12-10 DIAGNOSIS — I491 Atrial premature depolarization: Secondary | ICD-10-CM

## 2023-12-10 DIAGNOSIS — I10 Essential (primary) hypertension: Secondary | ICD-10-CM

## 2023-12-10 DIAGNOSIS — I471 Supraventricular tachycardia, unspecified: Secondary | ICD-10-CM | POA: Diagnosis not present

## 2023-12-10 NOTE — Patient Instructions (Signed)
 Medication Instructions:  NO CHANGES   Lab Work: NONE   Testing/Procedures: NONE  Follow-Up: At [facility], you and your health needs are our priority.  As part of our continuing mission to provide you with exceptional heart care, our providers are all part of one team.  This team includes your primary Cardiologist (physician) and Advanced Practice Providers or APPs (Physician Assistants and Nurse Practitioners) who all work together to provide you with the care you need, when you need it.  Your next appointment:   1 YEAR  Provider:   Stanly DELENA Leavens, MD [facility] [place], DNP.

## 2023-12-10 NOTE — Progress Notes (Signed)
 Cardiology Office Note:    Date:  12/10/2023  ID:  Tracey Riddle, DOB 1950/09/03, MRN [id number] PCP: Alba Sharper, MD  [facility] Providers Cardiologist:  Stanly DELENA Leavens, MD       Patient Profile:       Chief Complaint: 7-month follow-up for hypertension History of Present Illness:  Tracey Riddle is a 73 y.o. female with visit-pertinent history of T2DM, hypertension, GERD, PACs, SVT, mild AR and MR   She established care with cardiology service on 04/2020 for dizziness and palpitations.  Zio patch was ordered and completed on 06/06/2020 showing occasional PACs, SVT, no malignant arrhythmias.  Triggered events were associated with PACs.  She was started on carvedilol  3.125 mg twice daily.   She was seen in clinic on 01/17/2022.  She had no cardiovascular concerns or complaints at that time.  No medication changes were made, she was to follow-up in 1 year.  She was last seen in clinic on 08/13/2023.  She has been experiencing frequent headaches with blood pressures at home around 140/80.  Blood pressure in clinic was 142/80.  Amlodipine  was increased from 2.5 mg to 5 mg daily.   Discussed the use of AI scribe software for clinical note transcription with the patient, who gave verbal consent to proceed.  History of Present Illness Tracey Riddle is a 73 year old female with hypertension and diabetes who presents for follow-up of her hypertension.  Today patient is doing well without cardiovascular concerns or complaints.  She denies any anginal symptoms today.  Hypertension is now well-controlled with recent medication adjustments, achieving a blood pressure of 120/64 mmHg. Current medications include amlodipine  and carvedilol , with carvedilol  taken twice daily. She denies chest pain, shortness of breath, or palpitations.  Her daily activities are mostly sedentary, involving watching TV and cleaning the house. She struggles with weight management despite a good  diet.  Diabetes is well-controlled, though specific management details are not provided. She has a history of a stroke with no recent cardiovascular events.  Review of systems:  Please see the history of present illness. All other systems are reviewed and otherwise negative.      Studies Reviewed:    EKG Interpretation Date/Time:  Tuesday December 10 2023 13:35:51 EDT Ventricular Rate:  75 PR Interval:  154 QRS Duration:  72 QT Interval:  346 QTC Calculation: 386 R Axis:   -2  Text Interpretation: Normal sinus rhythm Minimal voltage criteria for LVH, may be normal variant ( R in aVL ) Nonspecific ST and T wave abnormality When compared with ECG of 13-Aug-2023 13:32, Nonspecific T wave abnormality, improved in Inferior leads QT has lengthened Confirmed by Rana Dixon [phone]) on 12/10/2023 2:00:59 PM    Echocardiogram 09/06/2021 1. Left ventricular ejection fraction, by estimation, is 60 to 65%. The  left ventricle has normal function. The left ventricle has no regional  wall motion abnormalities. Left ventricular diastolic parameters are  consistent with Grade I diastolic  dysfunction (impaired relaxation).   2. Right ventricular systolic function is normal. The right ventricular  size is normal. There is normal pulmonary artery systolic pressure.   3. Left atrial size was moderately dilated.   4. The mitral valve is grossly normal. Mild mitral valve regurgitation.   5. The aortic valve is normal in structure. Aortic valve regurgitation is  mild.   6. The inferior vena cava is normal in size with greater than 50%  respiratory variability, suggesting right atrial pressure of 3 mmHg.   7.  Cannot exclude a small PFO.   Risk Assessment/Calculations:              Physical Exam:   VS:  BP 120/64 (BP Location: Left Arm, Patient Position: Sitting, Cuff Size: Large)   Pulse 75   Ht 5' 3 (1.6 m)   BMI 34.72 kg/m    Wt Readings from Last 3 Encounters:  08/13/23 196 lb (88.9 kg)   07/16/23 196 lb 4 oz (89 kg)  06/13/23 196 lb 9.6 oz (89.2 kg)    GEN: Well nourished, well developed in no acute distress NECK: No JVD; No carotid bruits CARDIAC: RRR, no murmurs, rubs, gallops RESPIRATORY:  Clear to auscultation without rales, wheezing or rhonchi  ABDOMEN: Soft, non-tender, non-distended EXTREMITIES:  No edema; No acute deformity      Assessment and Plan:  Hypertension Blood pressure today is well-controlled at 120/64 - Continue amlodipine  5 mg daily and carvedilol  3.25 mg twice daily - Continue home BP monitoring and log  PACs SVT ZIO 05/2020 showed occasional PACs (1.3%) and 10 runs of SVT lasting 12 beats at longest with max rate of 184 bpm.  Her triggered events associated with sinus rhythm/PACs. - Quiescent and well-controlled.  Denies any palpitations, tachycardia, or irregular heart rhythm today - Continue carvedilol  3.125 mg twice daily    Mitral valve regurgitation Aortic valve regurgitation Echocardiogram 08/2021 shows mild mitral valve regurgitation and mild aortic valve regurgitation - Today she remains asymptomatic - There is no indication for intervention at this time - Recommend repeat echocardiogram in 1-3 years for routine monitoring   T2DM A1c 5.7 on 03/2023 - Managed by PCP       Dispo:  Return in about 1 year (around 12/09/2024).  Signed, Lum LITTIE Louis, NP

## 2023-12-16 ENCOUNTER — Ambulatory Visit (INDEPENDENT_AMBULATORY_CARE_PROVIDER_SITE_OTHER)

## 2023-12-16 VITALS — BP 135/62 | HR 70 | Temp 98.3°F

## 2023-12-16 DIAGNOSIS — M810 Age-related osteoporosis without current pathological fracture: Secondary | ICD-10-CM | POA: Diagnosis not present

## 2023-12-16 MED ORDER — DENOSUMAB 60 MG/ML ~~LOC~~ SOSY
60.0000 mg | PREFILLED_SYRINGE | Freq: Once | SUBCUTANEOUS | Status: AC
  Administered 2023-12-16: 60 mg via SUBCUTANEOUS

## 2023-12-16 NOTE — Progress Notes (Unsigned)
 Patient presents to nurse clinic for Prolia  injection. Patient had recent labs on 11/25/23 with normal Calcium  and Phosphorus levels.   Patient denies recent illness. Vitals below.   Today's Vitals   12/16/23 1538  BP: 135/62  Pulse: 70  Temp: 98.3 F (36.8 C)  SpO2: 98%   Administered prolia  SQ in left arm. Patient observed 15 minutes post injection, no signs of adverse reaction.   Chiquita JAYSON English, RN

## 2024-01-06 ENCOUNTER — Other Ambulatory Visit: Payer: Self-pay | Admitting: Family Medicine

## 2024-01-06 DIAGNOSIS — J302 Other seasonal allergic rhinitis: Secondary | ICD-10-CM

## 2024-01-06 DIAGNOSIS — N3941 Urge incontinence: Secondary | ICD-10-CM

## 2024-01-06 DIAGNOSIS — K219 Gastro-esophageal reflux disease without esophagitis: Secondary | ICD-10-CM

## 2024-01-13 ENCOUNTER — Ambulatory Visit: Payer: Medicare HMO

## 2024-01-13 VITALS — Ht 63.0 in | Wt 190.0 lb

## 2024-01-13 DIAGNOSIS — Z0000 Encounter for general adult medical examination without abnormal findings: Secondary | ICD-10-CM | POA: Diagnosis not present

## 2024-01-13 NOTE — Patient Instructions (Signed)
 Tracey Riddle , Thank you for taking time out of your busy schedule to complete your Annual Wellness Visit with me. I enjoyed our conversation and look forward to speaking with you again next year. I, as well as your care team,  appreciate your ongoing commitment to your health goals. Please review the following plan we discussed and let me know if I can assist you in the future. Your Game plan/ To Do List    Referrals: If you haven't heard from the office you've been referred to, please reach out to them at the phone provided.   Follow up Visits: Next Medicare AWV with our clinical staff: 01/14/2025 at 1:30 p.m. phone visit with Nurse Health Advisor   Have you seen your provider in the last 6 months (3 months if uncontrolled diabetes)? No, patient is overdue Next Office Visit with your provider: Office will call patient to schedule an appointment.  Clinician Recommendations:  Aim for 30 minutes of exercise or brisk walking, 6-8 glasses of water, and 5 servings of fruits and vegetables each day.       This is a list of the screening recommended for you and due dates:  Health Maintenance  Topic Date Due   DTaP/Tdap/Td vaccine (1 - Tdap) Never done   Hepatitis B Vaccine (2 of 3 - Risk 3-dose series) 10/13/2019   Eye exam for diabetics  04/17/2022   COVID-19 Vaccine (4 - 2024-25 season) 02/17/2023   Complete foot exam   03/06/2023   Hemoglobin A1C  09/27/2023   Flu Shot  01/17/2024   Yearly kidney health urinalysis for diabetes  04/28/2024   Yearly kidney function blood test for diabetes  06/12/2024   Medicare Annual Wellness Visit  01/12/2025   Colon Cancer Screening  05/04/2026   Pneumococcal Vaccine for age over 19  Completed   DEXA scan (bone density measurement)  Completed   Hepatitis C Screening  Completed   Zoster (Shingles) Vaccine  Completed   HPV Vaccine  Aged Out   Meningitis B Vaccine  Aged Out    Advanced directives: (Copy Requested) Please bring a copy of your health care  power of attorney and living will to the office to be added to your chart at your convenience. You can mail to [facility] [address] [place], [place] [address] or email to [email or web address] Advance Care Planning is important because it:  [x]  Makes sure you receive the medical care that is consistent with your values, goals, and preferences  [x]  It provides guidance to your family and loved ones and reduces their decisional burden about whether or not they are making the right decisions based on your wishes.  Follow the link provided in your after visit summary or read over the paperwork we have mailed to you to help you started getting your Advance Directives in place. If you need assistance in completing these, please reach out to us  so that we can help you!  See attachments for Preventive Care and Fall Prevention Tips.

## 2024-01-13 NOTE — Progress Notes (Signed)
 Because this visit was a virtual/telehealth visit,  certain criteria was not obtained, such a blood pressure, CBG if applicable, and timed get up and go. Any medications not marked as taking were not mentioned during the medication reconciliation part of the visit. Any vitals not documented were not able to be obtained due to this being a telehealth visit or patient was unable to self-report a recent blood pressure reading due to a lack of equipment at home via telehealth. Vitals that have been documented are verbally provided by the patient.   Subjective:   Tracey Riddle is a 73 y.o. who presents for a Medicare Wellness preventive visit.  As a reminder, Annual Wellness Visits don't include a physical exam, and some assessments may be limited, especially if this visit is performed virtually. We may recommend an in-person follow-up visit with your provider if needed.  Visit Complete: Virtual I connected with  Roby Wyles on 01/13/24 by a audio enabled telemedicine application and verified that I am speaking with the correct person using two identifiers.  Patient Location: Home  Provider Location: Home Office  I discussed the limitations of evaluation and management by telemedicine. The patient expressed understanding and agreed to proceed.  Vital Signs: Because this visit was a virtual/telehealth visit, some criteria may be missing or patient reported. Any vitals not documented were not able to be obtained and vitals that have been documented are patient reported.  VideoDeclined- This patient declined Librarian, academic. Therefore the visit was completed with audio only.  Persons Participating in Visit: Patient.  AWV Questionnaire: No: Patient Medicare AWV questionnaire was not completed prior to this visit.  Cardiac Risk Factors include: advanced age (>9men, >62 women);diabetes mellitus;obesity (BMI >30kg/m2)     Objective:    Today's Vitals    01/13/24 1306  Weight: 190 lb (86.2 kg)  Height: 5' 3 (1.6 m)  PainSc: 0-No pain   Body mass index is 33.66 kg/m.     01/13/2024    1:08 PM 07/16/2023    4:03 PM 04/29/2023    1:17 PM 03/29/2023    9:44 AM 02/12/2023    3:52 PM 01/29/2023    3:26 PM 01/07/2023    2:54 PM  Advanced Directives  Does Patient Have a Medical Advance Directive? No No No No No No No  Would patient like information on creating a medical advance directive? Yes (MAU/Ambulatory/Procedural Areas - Information given) No - Patient declined No - Patient declined No - Patient declined Yes (MAU/Ambulatory/Procedural Areas - Information given) No - Patient declined Yes (MAU/Ambulatory/Procedural Areas - Information given)    Current Medications (verified) Outpatient Encounter Medications as of 01/13/2024  Medication Sig   acetaminophen  (TYLENOL ) 500 MG tablet Take 2 tablets by mouth every 8 (eight) hours as needed for mild pain, moderate pain or headache.   amLODipine  (NORVASC ) 5 MG tablet Take 1 tablet (5 mg total) by mouth daily.   ASPIRIN  81 PO Take 81 mg by mouth daily.   atorvastatin  (LIPITOR ) 20 MG tablet TAKE 1 TABLET BY MOUTH DAILY AT 6 PM.   Calcium  Carb-Cholecalciferol (OYSTER SHELL CALCIUM  W/D) 500-5 MG-MCG TABS TAKE 2 TABLETS BY MOUTH EVERY DAY WITH BREAKFAST   carvedilol  (COREG ) 3.125 MG tablet Take 1 tablet (3.125 mg total) by mouth 2 (two) times daily with a meal.   denosumab  (PROLIA ) 60 MG/ML SOSY injection INJECT 1 SYRINGE UNDER THE SKIN ONCE EVERY 6 MONTHS   famotidine  (PEPCID ) 40 MG tablet TAKE 1 TABLET BY MOUTH  EVERY DAY   fesoterodine  (TOVIAZ ) 8 MG TB24 tablet TAKE 1 TABLET BY MOUTH EVERY DAY   fexofenadine  (ALLEGRA  ALLERGY) 60 MG tablet Take 1 tablet (60 mg total) by mouth as needed.   fluticasone  (FLONASE ) 50 MCG/ACT nasal spray SPRAY 2 SPRAYS INTO EACH NOSTRIL EVERY DAY   omeprazole  (PRILOSEC) 20 MG capsule TAKE 1 CAPSULE BY MOUTH EVERY DAY   ondansetron  (ZOFRAN -ODT) 4 MG disintegrating tablet  Take 4 mg by mouth 3 (three) times daily as needed.   sennosides-docusate sodium  (SENOKOT-S) 8.6-50 MG tablet Take 1 tablet by mouth daily.   No facility-administered encounter medications on file as of 01/13/2024.    Allergies (verified) Patient has no known allergies.   History: Past Medical History:  Diagnosis Date   Abnormal liver CT 08/27/2019   CT AP (08/26/19, [facility]): Mildly nodular hepatic contour c/w possible mild cirrhosis.   Diabetes mellitus type II, controlled (HCC) 08/27/2019   Dyspnea 09/03/2021   Hepatitis C    Past Surgical History:  Procedure Laterality Date   BIOPSY  09/07/2019   Procedure: BIOPSY;  Surgeon: Dianna Specking, MD;  Location: [facility] ENDOSCOPY;  Service: Endoscopy;;   ESOPHAGEAL BRUSHING  09/07/2019   Procedure: ESOPHAGEAL BRUSHING;  Surgeon: Dianna Specking, MD;  Location: [facility] ENDOSCOPY;  Service: Endoscopy;;   ESOPHAGOGASTRODUODENOSCOPY (EGD) WITH PROPOFOL  Left 09/07/2019   Procedure: ESOPHAGOGASTRODUODENOSCOPY (EGD) WITH PROPOFOL ;  Surgeon: Dianna Specking, MD;  Location: [facility] ENDOSCOPY;  Service: Endoscopy;  Laterality: Left;   History reviewed. No pertinent family history. Social History   Socioeconomic History   Marital status: Single    Spouse name: Not on file   Number of children: Not on file   Years of education: Not on file   Highest education level: Not on file  Occupational History   Not on file  Tobacco Use   Smoking status: Never   Smokeless tobacco: Never  Substance and Sexual Activity   Alcohol use: Not on file   Drug use: Never   Sexual activity: Never  Other Topics Concern   Not on file  Social History Narrative   Not on file   Social Drivers of Health   Financial Resource Strain: Low Risk  (01/13/2024)   Overall Financial Resource Strain (CARDIA)    Difficulty of Paying Living Expenses: Not hard at all  Food Insecurity: No Food Insecurity (01/13/2024)   Hunger Vital Sign    Worried About Running Out of Food in  the Last Year: Never true    Ran Out of Food in the Last Year: Never true  Transportation Needs: No Transportation Needs (01/13/2024)   PRAPARE - Administrator, Civil Service (Medical): No    Lack of Transportation (Non-Medical): No  Physical Activity: Insufficiently Active (01/13/2024)   Exercise Vital Sign    Days of Exercise per Week: 2 days    Minutes of Exercise per Session: 20 min  Stress: No Stress Concern Present (01/13/2024)   [employer] of Occupational Health - Occupational Stress Questionnaire    Feeling of Stress: Not at all  Social Connections: Moderately Isolated (01/13/2024)   Social Connection and Isolation Panel    Frequency of Communication with Friends and Family: More than three times a week    Frequency of Social Gatherings with Friends and Family: Three times a week    Attends Religious Services: 1 to 4 times per year    Active Member of Clubs or Organizations: No    Attends Banker Meetings: Never  Marital Status: Never married    Tobacco Counseling Counseling given: Not Answered    Clinical Intake:  Pre-visit preparation completed: Yes  Pain : No/denies pain Pain Score: 0-No pain     BMI - recorded: 33.66 Nutritional Status: BMI > 30  Obese Nutritional Risks: None Diabetes: No  Lab Results  Component Value Date   HGBA1C 5.7 03/29/2023   HGBA1C 5.7 10/15/2022   HGBA1C 5.5 03/15/2022     How often do you need to have someone help you when you read instructions, pamphlets, or other written materials from your doctor or pharmacy?: 1 - Never  Interpreter Needed?: No  Information entered by :: Phyllis Whitefield N. Ples Trudel, LPN.   Activities of Daily Living     01/13/2024    1:11 PM  In your present state of health, do you have any difficulty performing the following activities:  Hearing? 0  Vision? 0  Difficulty concentrating or making decisions? 0  Walking or climbing stairs? 0  Dressing or bathing? 0  Doing  errands, shopping? 0  Preparing Food and eating ? N  Using the Toilet? N  In the past six months, have you accidently leaked urine? Y  Do you have problems with loss of bowel control? N  Managing your Medications? N  Managing your Finances? N  Housekeeping or managing your Housekeeping? N    Patient Care Team: Alba Sharper, MD as PCP - General (Family Medicine) Santo Stanly LABOR, MD as PCP - Cardiology (Cardiology) Pa, [facility] Loreda Hacker, DPM as Consulting Physician (Podiatry)  I have updated your Care Teams any recent Medical Services you may have received from other providers in the past year.     Assessment:   This is a routine wellness examination for Pamla.  Hearing/Vision screen Hearing Screening - Comments:: Denies hearing difficulties.  Vision Screening - Comments:: Wears rx glasses - up to date with routine eye exams with Dr. Roz (recently retired)    Goals Addressed             This Visit's Progress    01/13/24: To lose weight.         Depression Screen     01/13/2024    1:12 PM 07/16/2023    4:04 PM 06/13/2023    3:12 PM 04/29/2023    1:16 PM 03/29/2023    9:44 AM 02/12/2023    3:51 PM 01/29/2023    4:20 PM  PHQ 2/9 Scores  PHQ - 2 Score 0 0 0 0 0 0 0  PHQ- 9 Score 1 2 3 2 1 2 4     Fall Risk     01/13/2024    1:10 PM 07/16/2023    4:04 PM 06/13/2023    3:11 PM 04/29/2023    1:16 PM 03/29/2023    9:45 AM  Fall Risk   Falls in the past year? 0 0 0 0 0  Number falls in past yr: 0 0  0 0  Injury with Fall? 0 0  0 0  Risk for fall due to : No Fall Risks      Follow up Falls evaluation completed        MEDICARE RISK AT HOME:  Medicare Risk at Home Any stairs in or around the home?: Yes If so, are there any without handrails?: No Home free of loose throw rugs in walkways, pet beds, electrical cords, etc?: Yes Adequate lighting in your home to reduce risk of falls?: Yes Life alert?: No Use  of a cane, walker or w/c?:  Yes (CANE) Grab bars in the bathroom?: Yes Shower chair or bench in shower?: Yes Elevated toilet seat or a handicapped toilet?: No  TIMED UP AND GO:  Was the test performed?  No  Cognitive Function: Declined/Normal: No cognitive concerns noted by patient or family. Patient alert, oriented, able to answer questions appropriately and recall recent events. No signs of memory loss or confusion.    01/13/2024    1:12 PM  MMSE - Mini Mental State Exam  Not completed: Unable to complete        01/13/2024    1:17 PM 01/07/2023    2:53 PM  6CIT Screen  What Year? 0 points 0 points  What month? 0 points 0 points  What time? 0 points 0 points  Count back from 20 0 points 0 points  Months in reverse 0 points 0 points  Repeat phrase 0 points 0 points  Total Score 0 points 0 points    Immunizations Immunization History  Administered Date(s) Administered   Fluad Quad(high Dose 65+) 05/04/2020, 03/15/2022   Fluad Trivalent(High Dose 65+) 03/29/2023   Hepatitis B, ADULT 09/15/2019   Influenza, High Dose Seasonal PF 03/28/2019   Moderna Sars-Covid-2 Vaccination 07/01/2019, 07/29/2019   PFIZER(Purple Top)SARS-COV-2 Vaccination 05/27/2020   PNEUMOCOCCAL CONJUGATE-20 03/15/2022   Pneumococcal Polysaccharide-23 04/17/2018   Zoster Recombinant(Shingrix ) 04/29/2023, 08/22/2023    Screening Tests Health Maintenance  Topic Date Due   DTaP/Tdap/Td (1 - Tdap) Never done   Hepatitis B Vaccines (2 of 3 - Risk 3-dose series) 10/13/2019   OPHTHALMOLOGY EXAM  04/17/2022   COVID-19 Vaccine (4 - 2024-25 season) 02/17/2023   FOOT EXAM  03/06/2023   HEMOGLOBIN A1C  09/27/2023   INFLUENZA VACCINE  01/17/2024   Diabetic kidney evaluation - Urine ACR  04/28/2024   Diabetic kidney evaluation - eGFR measurement  06/12/2024   Medicare Annual Wellness (AWV)  01/12/2025   Colonoscopy  05/04/2026   Pneumococcal Vaccine: 50+ Years  Completed   DEXA SCAN  Completed   Hepatitis C Screening  Completed    Zoster Vaccines- Shingrix   Completed   HPV VACCINES  Aged Out   Meningococcal B Vaccine  Aged Out    Health Maintenance  Health Maintenance Due  Topic Date Due   DTaP/Tdap/Td (1 - Tdap) Never done   Hepatitis B Vaccines (2 of 3 - Risk 3-dose series) 10/13/2019   OPHTHALMOLOGY EXAM  04/17/2022   COVID-19 Vaccine (4 - 2024-25 season) 02/17/2023   FOOT EXAM  03/06/2023   HEMOGLOBIN A1C  09/27/2023   Health Maintenance Items Addressed: Yes Patient aware of current care gaps.  Immunization record was verified by [facility] and updated in patient's chart.   Additional Screening:  Vision Screening: Recommended annual ophthalmology exams for early detection of glaucoma and other disorders of the eye. Would you like a referral to an eye doctor? No    Dental Screening: Recommended annual dental exams for proper oral hygiene  Community Resource Referral / Chronic Care Management: CRR required this visit?  No   CCM required this visit?  No   Plan:    I have personally reviewed and noted the following in the patient's chart:   Medical and social history Use of alcohol, tobacco or illicit drugs  Current medications and supplements including opioid prescriptions. Patient is not currently taking opioid prescriptions. Functional ability and status Nutritional status Physical activity Advanced directives List of other physicians Hospitalizations, surgeries, and ER visits in previous 12  months Vitals Screenings to include cognitive, depression, and falls Referrals and appointments  In addition, I have reviewed and discussed with patient certain preventive protocols, quality metrics, and best practice recommendations. A written personalized care plan for preventive services as well as general preventive health recommendations were provided to patient.   Roz LOISE Fuller, LPN   2/71/7974   After Visit Summary: (MyChart) Due to this being a telephonic visit, the after visit summary with  patients personalized plan was offered to patient via MyChart   Notes: Patient aware of current care gaps.  Immunization record was verified by [facility] and updated in patient's chart. Patient is due for the following care gaps: vaccines, Foot Exam, Eye Exam and labwork to check Diabetes.

## 2024-01-14 ENCOUNTER — Encounter: Payer: Self-pay | Admitting: Family Medicine

## 2024-02-20 ENCOUNTER — Ambulatory Visit: Admitting: Podiatry

## 2024-03-12 ENCOUNTER — Ambulatory Visit: Admitting: Podiatry

## 2024-03-13 ENCOUNTER — Ambulatory Visit (INDEPENDENT_AMBULATORY_CARE_PROVIDER_SITE_OTHER): Admitting: Podiatry

## 2024-03-13 ENCOUNTER — Encounter: Payer: Self-pay | Admitting: Podiatry

## 2024-03-13 DIAGNOSIS — B351 Tinea unguium: Secondary | ICD-10-CM | POA: Diagnosis not present

## 2024-03-13 DIAGNOSIS — E119 Type 2 diabetes mellitus without complications: Secondary | ICD-10-CM

## 2024-03-13 DIAGNOSIS — M79675 Pain in left toe(s): Secondary | ICD-10-CM | POA: Diagnosis not present

## 2024-03-13 DIAGNOSIS — M79674 Pain in right toe(s): Secondary | ICD-10-CM | POA: Diagnosis not present

## 2024-03-13 NOTE — Progress Notes (Signed)
 This patient returns to my office for at risk foot care.  This patient requires this care by a professional since this patient will be at risk due to having type 2 diabetes.  This patient is unable to cut nails himself since the patient cannot reach his nails.These nails are painful walking and wearing shoes.  This patient presents for at risk foot care today.  General Appearance  Alert, conversant and in no acute stress.  Vascular  Dorsalis pedis and posterior tibial  pulses are palpable  bilaterally.  Capillary return is within normal limits  bilaterally. Temperature is within normal limits  bilaterally.  Neurologic  Senn-Weinstein monofilament wire test within normal limits  bilaterally. Muscle power within normal limits bilaterally.  Nails Thick disfigured discolored nails with subungual debris  from hallux to fifth toes bilaterally. No evidence of bacterial infection or drainage bilaterally.  Orthopedic  No limitations of motion  feet .  No crepitus or effusions noted.  No bony pathology or digital deformities noted.  Skin  normotropic skin with no porokeratosis noted bilaterally.  No signs of infections or ulcers noted.     Onychomycosis  Pain in right toes  Pain in left toes  Consent was obtained for treatment procedures.   Mechanical debridement of nails 1-5  bilaterally performed with a nail nipper.  Filed with dremel without incident.    Return office visit    3 months                 Told patient to return for periodic foot care and evaluation due to potential at risk complications.   Helane Gunther DPM

## 2024-03-20 ENCOUNTER — Encounter: Payer: Self-pay | Admitting: Pharmacist

## 2024-03-20 NOTE — Progress Notes (Signed)
 Tracey Riddle                                          MRN: [id number]   03/20/2024   The VBCI Quality Team Specialist reviewed this patient medical record for the purposes of chart review for care gap closure. The following were reviewed: chart review for care gap closure-kidney health evaluation for diabetes:eGFR  and uACR.    VBCI Quality Team

## 2024-03-20 NOTE — Progress Notes (Signed)
 This patient is appearing on a report for being at risk of failing the adherence measure for cholesterol (statin) medications this calendar year.   Medication: atorvastatin  20 mg Last fill date: 01/29/24 for 90 day supply  Insurance report was not up to date. No action needed at this time.

## 2024-03-24 ENCOUNTER — Other Ambulatory Visit: Payer: Self-pay | Admitting: Family Medicine

## 2024-03-24 DIAGNOSIS — M810 Age-related osteoporosis without current pathological fracture: Secondary | ICD-10-CM

## 2024-03-24 DIAGNOSIS — J302 Other seasonal allergic rhinitis: Secondary | ICD-10-CM

## 2024-03-24 DIAGNOSIS — K219 Gastro-esophageal reflux disease without esophagitis: Secondary | ICD-10-CM

## 2024-03-26 NOTE — Progress Notes (Signed)
 Tracey Riddle                                          MRN: [id number]   03/26/2024   The VBCI Quality Team Specialist reviewed this patient medical record for the purposes of chart review for care gap closure. The following were reviewed: chart review for care gap closure-glycemic status assessment.    VBCI Quality Team

## 2024-03-30 ENCOUNTER — Telehealth: Payer: Self-pay | Admitting: Pharmacist

## 2024-03-30 NOTE — Telephone Encounter (Signed)
 Contacted niece to schedule PCP visit for Diabetes QI type visit.   Patient in need of A1C, Lipid, UACR - BMET done this year.   Scheduled with Dr. Alba  10/24 Thanked me for assistance with scheduling.

## 2024-03-31 NOTE — Telephone Encounter (Signed)
 Reviewed and agree with Dr Rennis plan.

## 2024-04-10 ENCOUNTER — Encounter: Payer: Self-pay | Admitting: Family Medicine

## 2024-04-10 ENCOUNTER — Ambulatory Visit (INDEPENDENT_AMBULATORY_CARE_PROVIDER_SITE_OTHER): Admitting: Family Medicine

## 2024-04-10 VITALS — BP 117/85 | HR 73 | Ht 63.0 in | Wt 197.4 lb

## 2024-04-10 DIAGNOSIS — E119 Type 2 diabetes mellitus without complications: Secondary | ICD-10-CM | POA: Diagnosis not present

## 2024-04-10 DIAGNOSIS — G8929 Other chronic pain: Secondary | ICD-10-CM | POA: Diagnosis not present

## 2024-04-10 DIAGNOSIS — K7460 Unspecified cirrhosis of liver: Secondary | ICD-10-CM | POA: Diagnosis not present

## 2024-04-10 DIAGNOSIS — E1169 Type 2 diabetes mellitus with other specified complication: Secondary | ICD-10-CM | POA: Diagnosis not present

## 2024-04-10 DIAGNOSIS — Z23 Encounter for immunization: Secondary | ICD-10-CM | POA: Diagnosis not present

## 2024-04-10 DIAGNOSIS — E785 Hyperlipidemia, unspecified: Secondary | ICD-10-CM

## 2024-04-10 DIAGNOSIS — J302 Other seasonal allergic rhinitis: Secondary | ICD-10-CM

## 2024-04-10 DIAGNOSIS — Z0000 Encounter for general adult medical examination without abnormal findings: Secondary | ICD-10-CM | POA: Diagnosis not present

## 2024-04-10 DIAGNOSIS — M545 Low back pain, unspecified: Secondary | ICD-10-CM

## 2024-04-10 LAB — POCT GLYCOSYLATED HEMOGLOBIN (HGB A1C): HbA1c, POC (controlled diabetic range): 5.8 % (ref 0.0–7.0)

## 2024-04-10 MED ORDER — TIZANIDINE HCL 4 MG PO TABS: 4.0000 mg | ORAL_TABLET | Freq: Three times a day (TID) | ORAL | 0 refills | Status: DC | PRN

## 2024-04-10 MED ORDER — LIDOCAINE 5 % EX PTCH: 1.0000 | MEDICATED_PATCH | CUTANEOUS | 0 refills | Status: AC

## 2024-04-10 MED ORDER — FEXOFENADINE HCL 60 MG PO TABS: 60.0000 mg | ORAL_TABLET | ORAL | 2 refills | Status: AC | PRN

## 2024-04-10 NOTE — Progress Notes (Signed)
 SUBJECTIVE:   CHIEF COMPLAINT / HPI: annual  Discussed the use of AI scribe software for clinical note transcription with the patient, who gave verbal consent to proceed.  History of Present Illness Tracey Riddle is a 73 year old female who presents with persistent back pain and frequent colds.  Chronic lower back pain - Persistent bilateral lower back pain - Managed with Tylenol  1000 mg twice daily without relief - Physical therapy provided temporary improvement - No numbness or tingling in the legs - Intermittent long-standing leg pain  Upper respiratory symptoms - Frequent colds with persistent dry cough and runny nose - Cough is sudden in onset, difficult to stop, and lasts a couple of days - Over-the-counter cough drops and medicine are ineffective - Uses Flonase  nasal spray - No recent use of Allegra  - No fever, weight loss, chills, or hemoptysis    PERTINENT  PMH / PSH: HTN, PAC, LVH, Cirrhosis, T2DM, Osteoarthritis, Hx of stroke, Hx of Hep C  OBJECTIVE:   BP 117/85   Pulse 73   Ht 5' 3 (1.6 m)   Wt 197 lb 6 oz (89.5 kg)   SpO2 99%   BMI 34.96 kg/m   Physical Exam General: NAD, well appearing Neuro: A&O Cardiovascular: RRR, no murmurs,  Respiratory: normal WOB on RA, CTAB, no wheezes, ronchi or rales Abdomen: soft, NTTP, no rebound or guarding Extremities: Moving all 4 extremities equally, no peripheral edema Back: bilateral paraspinal muscle tenderness, no midline tenderness, normal strength and ROM of lower extremities with with walking and going from sitting to standing   ASSESSMENT/PLAN:   Assessment & Plan Type 2 diabetes mellitus without complication, without long-term current use of insulin  (HCC) A1c today 5.8.  Continue lifestyle modifications.  Repeat A1c 6 months. 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 Biannual monitoring with liver US , AFP yearly. 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  Discussed that degenerative disc disease is not curable but can be managed symptomatically with goal of improving functionality and quality of life. - Continue Tylenol  as needed. - Recommend back exercises and physical therapy indefinitely, provided with back exercises - Advise use of ice packs and heating pads as needed. - Prescribe lidocaine patches for topical pain relief . - Prescribe tizanidine for nighttime use and as needed use, cautioning about potential drowsiness and fall risk. Seasonal allergies Allergic rhinitis with chronic cough and nasal congestion.  Restart Allegra  given seasonal change.  Follow-up 1 month. - Allegra  ordered - Continue Flonase  daily - Monitor response to allergy medication and reassess if symptoms persist. Hyperlipidemia associated with type 2 diabetes mellitus (HCC) Continue Atorvastatin , repeat lipid panel. Annual physical exam Encounter for immunization PHQ score 1, reviewed and discussed.  BP reviewed and at goal.   Considered the following items based upon USPSTF recommendations: Diabetes screening: ordered HIV testing:discussed and declined Hepatitis C: discussed and declined Hepatitis B:discussed and declined Syphilis if at high risk: low risk GC/CT low risk Lipid panel (nonfasting or fasting) discussed based upon AHA recommendations and ordered.  Consider repeat every 4-6 years.  Osteoporosis screening considered based upon risk of fracture from [facility] calculator. Patient on osteoporotic medication.  Cancer Screening Discussion  Breast cancer screening: at age cut off for screening, elected to not continue Lung cancer screening:not indicated as does not meet criteria.  See documentation below regarding  indications/risks/benefits.  Colorectal cancer screening: up to date on screening for CRC..   Vaccinations, flu vaccine administered today..   Follow up in 1 year or sooner if indicated.  MyChart Activation: Already signed up    Return in about 3 months (around 07/11/2024).  Tracey Provencal, MD, PGY-3 [facility] 7:51 PM 04/11/2024  [facility]

## 2024-04-10 NOTE — Patient Instructions (Addendum)
 It was great to see you! Thank you for allowing me to participate in your care!  Our plans for today:   VISIT SUMMARY: Today, we addressed your persistent back pain and frequent colds. We also discussed your liver health and general health maintenance.  YOUR PLAN: CHRONIC LOW BACK PAIN: Your persistent lower back pain is likely due to spinal osteoarthritis. -Continue taking Tylenol  as needed. -Engage in back exercises and physical therapy indefinitely. -Use ice packs and heating pads as needed. -We have prescribed lidocaine patches for topical pain relief . -We have prescribed tizanidine for nighttime use. Be cautious as it may cause drowsiness and increase the risk of falls.  ALLERGIC RHINITIS WITH CHRONIC COUGH AND NASAL CONGESTION: Your frequent colds and nasal congestion are likely due to allergies. -We have prescribed Allegra . Please check your insurance coverage. -Consider using Zyrtec or Claritin as alternatives if needed. -Monitor your response to the allergy medication and let us  know if symptoms persist.  CIRRHOSIS OF LIVER: You have cirrhosis of the liver due to hepatitis C. Regular monitoring is necessary. -We have ordered a liver ultrasound. -We have ordered an AFP blood test. -Please arrange for blood work to be done at an external lab.  GENERAL HEALTH MAINTENANCE: Routine mammogram screenings have been discontinued based on guidelines. -Routine mammogram screenings are discontinued. We will restart if you develop any breast symptoms.    Please arrive 15 minutes PRIOR to your next scheduled appointment time! If you do not, this affects OTHER patients' care.  Take care and seek immediate care sooner if you develop any concerns.   Ozell Provencal, MD, PGY-3 [place] [facility] 3:30 PM 04/10/2024  [facility]

## 2024-04-11 NOTE — Assessment & Plan Note (Signed)
 A1c today 5.8.  Continue lifestyle modifications.  Repeat A1c 6 months.

## 2024-04-11 NOTE — Assessment & Plan Note (Signed)
 Cirrhosis secondary to hepatitis C. Biannual monitoring with liver US , AFP yearly. - Right upper quadrant ultrasound - AFP - Hepatic function panel

## 2024-04-11 NOTE — Assessment & Plan Note (Signed)
 Chronic low back pain due to spinal osteoarthritis.  Discussed that degenerative disc disease is not curable but can be managed symptomatically with goal of improving functionality and quality of life. - Continue Tylenol  as needed. - Recommend back exercises and physical therapy indefinitely, provided with back exercises - Advise use of ice packs and heating pads as needed. - Prescribe lidocaine patches for topical pain relief . - Prescribe tizanidine for nighttime use and as needed use, cautioning about potential drowsiness and fall risk.

## 2024-04-11 NOTE — Assessment & Plan Note (Signed)
 Allergic rhinitis with chronic cough and nasal congestion.  Restart Allegra  given seasonal change.  Follow-up 1 month. - Allegra  ordered - Continue Flonase  daily - Monitor response to allergy medication and reassess if symptoms persist.

## 2024-04-11 NOTE — Assessment & Plan Note (Signed)
 Continue Atorvastatin , repeat lipid panel.

## 2024-04-14 ENCOUNTER — Telehealth: Payer: Self-pay

## 2024-04-14 ENCOUNTER — Other Ambulatory Visit (HOSPITAL_COMMUNITY): Payer: Self-pay

## 2024-04-14 NOTE — Telephone Encounter (Signed)
 Prior authorization submitted for LIDOCAINE 5% PATCHES to [employer] via Latent.   Key: [id number]

## 2024-04-15 NOTE — Telephone Encounter (Signed)
 Pharmacy Patient Advocate Encounter  Received notification from [employer] that Prior Authorization for LIDOCAINE 5% PATCHES has been APPROVED from 04/14/24 to 06/17/25   PA #/Case ID/Reference #: [id number]

## 2024-04-16 ENCOUNTER — Telehealth: Payer: Self-pay

## 2024-04-16 NOTE — Telephone Encounter (Signed)
 Attempted to reach patient through interperter Pok. To see if patient  is using [facility] mail order service. For medication FEXOFENADINE   TAB. No answer will try again. Nelson Land, CMA

## 2024-04-17 ENCOUNTER — Other Ambulatory Visit

## 2024-04-17 DIAGNOSIS — K7460 Unspecified cirrhosis of liver: Secondary | ICD-10-CM

## 2024-04-17 DIAGNOSIS — E1169 Type 2 diabetes mellitus with other specified complication: Secondary | ICD-10-CM | POA: Diagnosis not present

## 2024-04-17 DIAGNOSIS — E119 Type 2 diabetes mellitus without complications: Secondary | ICD-10-CM

## 2024-04-17 DIAGNOSIS — E785 Hyperlipidemia, unspecified: Secondary | ICD-10-CM | POA: Diagnosis not present

## 2024-04-18 LAB — HEPATIC FUNCTION PANEL
ALT: 18 IU/L (ref 0–32)
AST: 17 IU/L (ref 0–40)
Albumin: 4.7 g/dL (ref 3.8–4.8)
Alkaline Phosphatase: 57 IU/L (ref 49–135)
Bilirubin Total: 0.3 mg/dL (ref 0.0–1.2)
Bilirubin, Direct: 0.12 mg/dL (ref 0.00–0.40)
Total Protein: 7.4 g/dL (ref 6.0–8.5)

## 2024-04-18 LAB — LIPID PANEL
Chol/HDL Ratio: 2.3 ratio (ref 0.0–4.4)
Cholesterol, Total: 121 mg/dL (ref 100–199)
HDL: 53 mg/dL (ref >39)
LDL Chol Calc (NIH): 47 mg/dL (ref 0–99)
Triglycerides: 121 mg/dL (ref 0–149)
VLDL Cholesterol Cal: 21 mg/dL (ref 5–40)

## 2024-04-20 ENCOUNTER — Ambulatory Visit: Payer: Self-pay | Admitting: Family Medicine

## 2024-04-23 ENCOUNTER — Telehealth: Payer: Self-pay | Admitting: Pharmacist

## 2024-04-23 NOTE — Telephone Encounter (Signed)
 Patient contacted for follow-up of medication adherence with Atorvastatin  20 mg.   Since last contact patient reports that she has the medication currently and needs a refill on it. Called pharmacy to refill medication for patient.   Total time with patient call and documentation of interaction: 8 minutes.

## 2024-04-24 ENCOUNTER — Ambulatory Visit
Admission: RE | Admit: 2024-04-24 | Discharge: 2024-04-24 | Disposition: A | Discharge status: Home / Self Care | Source: Ambulatory Visit | Attending: Family Medicine | Admitting: Family Medicine

## 2024-04-24 DIAGNOSIS — K7460 Unspecified cirrhosis of liver: Secondary | ICD-10-CM | POA: Diagnosis not present

## 2024-04-28 NOTE — Progress Notes (Signed)
 Consistent with previous RUQ US . No focal lesion. Continue routine screening q6 months.

## 2024-05-08 ENCOUNTER — Telehealth: Payer: Self-pay

## 2024-05-08 NOTE — Telephone Encounter (Signed)
 Patients niece calls nurse line requesting cough medication.   She reports the cough has been persistent since October. She reports they mentioned this to PCP, however the suggestion at the time was allergy medication.   She reports sometimes it's a wet cough with clear phlegm and sometimes it's dry.   She denies any fevers, shortness of breath, body aches, chills or congestion.   She reports she has tried every OTC medication without relief. She reports the cough is worse at night and she is barely sleeping.   She is requesting a prescription medication to hopefully help.   Advised will forward to PCP.   Precautions discussed.  [facility] [address].

## 2024-05-10 ENCOUNTER — Other Ambulatory Visit: Payer: Self-pay | Admitting: Family Medicine

## 2024-05-10 DIAGNOSIS — G8929 Other chronic pain: Secondary | ICD-10-CM

## 2024-05-11 ENCOUNTER — Ambulatory Visit: Admitting: Family Medicine

## 2024-05-11 ENCOUNTER — Encounter: Payer: Self-pay | Admitting: Family Medicine

## 2024-05-11 VITALS — BP 146/91 | HR 90 | Temp 98.1°F | Ht 63.0 in | Wt 192.6 lb

## 2024-05-11 DIAGNOSIS — R053 Chronic cough: Secondary | ICD-10-CM | POA: Diagnosis not present

## 2024-05-11 LAB — POC SOFIA 2 FLU + SARS ANTIGEN FIA
Influenza A, POC: NEGATIVE
Influenza B, POC: NEGATIVE
SARS Coronavirus 2 Ag: NEGATIVE

## 2024-05-11 MED ORDER — FAMOTIDINE 40 MG PO TABS: 40.0000 mg | ORAL_TABLET | Freq: Every day | ORAL | 3 refills | Status: AC

## 2024-05-11 NOTE — Progress Notes (Signed)
    SUBJECTIVE:   CHIEF COMPLAINT / HPI:   Saw Dr. Alba one month ago, restarted Allegra , supposed to follow up to assess response.   Patient is still having dry cough, despite taking the Allegra  daily since seeing Dr. Quillin.  She reports that the cough gets worse at night especially when she lies down flat.  She is getting less sleep because she is afraid to lie down.  Her chest and abdomen hurt because of how much she has been coughing.  Observed several instances of patient's cough in the room it sounds dry, and she did not produce any sputum.  Patient's daughter who acts as translator reports that sometimes she gets some foamy spit but has not produced any mucus.  Patient and daughter deny fevers, chills, worsening Raynaud's, or shortness of breath.  PERTINENT  PMH / PSH: HTN, T2DM  OBJECTIVE:   BP (!) 146/91   Pulse 90   Temp 98.1 F (36.7 C)   Ht 5' 3 (1.6 m)   Wt 192 lb 9.6 oz (87.4 kg)   SpO2 97%   BMI 34.12 kg/m   General: A&O, NAD Cardiac: RRR, no m/r/g Respiratory: CTAB, normal WOB, no w/c/r GI: Soft, NTTP, non-distended  Extremities: NTTP, no peripheral edema.  ASSESSMENT/PLAN:   Assessment & Plan Chronic cough - [name] swab completed at patient request-negative in office -Given chronicity of cough suspect GERD versus possible CHF or other lung pathology -Begin famotidine  40 mg daily -Obtain CBC, BMP, BNP -Follow-up once results have been received, patient has been stable with this cough for greater than 1 month at this time -Will consider chest x-ray depending on what lab results show   Tracey Pinal, DO [facility]

## 2024-05-11 NOTE — Patient Instructions (Signed)
 It was wonderful to see you today!  Because of your cough has not yet subsided, I have ordered a couple of blood tests to check and make sure that you do not have an infection or an issue called pulmonary edema.  If either of the tests come back positive, you will receive a phone call from the scheduling you for an x-ray.  This will look at your lungs in more detail to try and figure out what is going on.  I have also prescribed a medicine called famotidine  this is a medicine that will reduce the acid in your stomach and may help reduce your cough if it is caused by reflux.  Since your cough gets worse with laying down this could point towards reflux if the other tests are negative.  We also swabbed you for COVID and flu today to make sure that you do not have a these viruses.  Either of these tests come back positive you should stay home rest and drink plenty of water until your symptoms improve.  Please call [phone] with any questions about today's appointment.   If you need any additional refills, please call your pharmacy before calling the office.  Lucie Pinal, DO Family Medicine

## 2024-05-13 ENCOUNTER — Ambulatory Visit: Payer: Self-pay | Admitting: Family Medicine

## 2024-05-13 LAB — CBC WITH DIFFERENTIAL/PLATELET
Basophils Absolute: 0 x10E3/uL (ref 0.0–0.2)
Basos: 1 %
EOS (ABSOLUTE): 0.1 x10E3/uL (ref 0.0–0.4)
Eos: 1 %
Hematocrit: 47.6 % — ABNORMAL HIGH (ref 34.0–46.6)
Hemoglobin: 15.6 g/dL (ref 11.1–15.9)
Immature Grans (Abs): 0 x10E3/uL (ref 0.0–0.1)
Immature Granulocytes: 0 %
Lymphocytes Absolute: 1.6 x10E3/uL (ref 0.7–3.1)
Lymphs: 38 %
MCH: 30.7 pg (ref 26.6–33.0)
MCHC: 32.8 g/dL (ref 31.5–35.7)
MCV: 94 fL (ref 79–97)
Monocytes Absolute: 0.5 x10E3/uL (ref 0.1–0.9)
Monocytes: 11 %
Neutrophils Absolute: 2.1 x10E3/uL (ref 1.4–7.0)
Neutrophils: 49 %
Platelets: 189 x10E3/uL (ref 150–450)
RBC: 5.08 x10E6/uL (ref 3.77–5.28)
RDW: 13.2 % (ref 11.7–15.4)
WBC: 4.3 x10E3/uL (ref 3.4–10.8)

## 2024-05-13 LAB — BASIC METABOLIC PANEL WITH GFR
BUN/Creatinine Ratio: 13 (ref 12–28)
BUN: 10 mg/dL (ref 8–27)
CO2: 23 mmol/L (ref 20–29)
Calcium: 10 mg/dL (ref 8.7–10.3)
Chloride: 100 mmol/L (ref 96–106)
Creatinine, Ser: 0.79 mg/dL (ref 0.57–1.00)
Glucose: 130 mg/dL — ABNORMAL HIGH (ref 70–99)
Potassium: 4.1 mmol/L (ref 3.5–5.2)
Sodium: 140 mmol/L (ref 134–144)
eGFR: 78 mL/min/1.73 (ref >59)

## 2024-05-13 LAB — BRAIN NATRIURETIC PEPTIDE: BNP: 86.2 pg/mL (ref 0.0–100.0)

## 2024-05-18 ENCOUNTER — Other Ambulatory Visit: Payer: Self-pay | Admitting: Family Medicine

## 2024-05-18 DIAGNOSIS — M810 Age-related osteoporosis without current pathological fracture: Secondary | ICD-10-CM

## 2024-05-19 NOTE — Telephone Encounter (Signed)
 Patient's niece returns call to nurse line regarding results.   Advised of results per note from Dr. Cleotilde.   Niece is asking if we can proceed with imaging given that patient continues to have cough. The cough is causing her to have muscle strain in her chest and stomach. Will forward request to Dr. Cleotilde.   Chiquita JAYSON English, RN

## 2024-05-20 ENCOUNTER — Ambulatory Visit
Admission: RE | Admit: 2024-05-20 | Discharge: 2024-05-20 | Disposition: A | Source: Ambulatory Visit | Attending: Family Medicine

## 2024-05-20 ENCOUNTER — Ambulatory Visit: Admitting: Family Medicine

## 2024-05-20 ENCOUNTER — Encounter: Payer: Self-pay | Admitting: Family Medicine

## 2024-05-20 VITALS — BP 138/83 | HR 84 | Ht 63.0 in | Wt 193.5 lb

## 2024-05-20 DIAGNOSIS — R053 Chronic cough: Secondary | ICD-10-CM

## 2024-05-20 DIAGNOSIS — K219 Gastro-esophageal reflux disease without esophagitis: Secondary | ICD-10-CM | POA: Diagnosis not present

## 2024-05-20 MED ORDER — OMEPRAZOLE 20 MG PO CPDR: 20.0000 mg | DELAYED_RELEASE_CAPSULE | Freq: Two times a day (BID) | ORAL | 1 refills | Status: AC

## 2024-05-20 MED ORDER — ALBUTEROL SULFATE HFA 108 (90 BASE) MCG/ACT IN AERS: 2.0000 | INHALATION_SPRAY | Freq: Four times a day (QID) | RESPIRATORY_TRACT | 2 refills | Status: AC | PRN

## 2024-05-20 MED ORDER — GUAIFENESIN-DM 100-10 MG/5ML PO SYRP: 5.0000 mL | ORAL_SOLUTION | Freq: Four times a day (QID) | ORAL | 0 refills | Status: AC | PRN

## 2024-05-20 NOTE — Telephone Encounter (Signed)
 Addressed in today's visit. Please see note.

## 2024-05-20 NOTE — Telephone Encounter (Signed)
 Called patient's niece. Scheduled same day appt with Dr. Alba.   Tracey JAYSON English, RN

## 2024-05-20 NOTE — Progress Notes (Signed)
    SUBJECTIVE:   CHIEF COMPLAINT / HPI: f/u cough  Discussed the use of AI scribe software for clinical note transcription with the patient, who gave verbal consent to proceed.  History of Present Illness Tracey Riddle is a 73 year old female who presents with a persistent cough lasting over two months.  Chronic cough - Persistent for over two months - Worsens in the evenings - Continuous throughout the day - No hemoptysis - No fever - Chest and back pain associated with coughing - Pain requires management with Tylenol  and previously prescribed tizanidine  - Tizanidine  is no longer effective - Cough significantly affects appetite and energy levels - Tried over-the-counter allergy medications, Flonase  nasal spray, and sugar-free cough remedies without relief - No recent use of cough syrups but considering them due to lack of efficacy of other options - Takes omeprazole  20 mg daily for heartburn    PERTINENT  PMH / PSH: Hypertension, cirrhosis, type 2 diabetes, HLD, osteoporosis overactive bladder, history of stroke  OBJECTIVE:   BP 138/83   Pulse 84   Ht 5' 3 (1.6 m)   Wt 193 lb 8 oz (87.8 kg)   SpO2 97%   BMI 34.28 kg/m   Physical Exam General: NAD, well appearing HEENT: MMM, erythematous posterior oropharyngeal erythema Neuro: A&O Cardiovascular: RRR, no murmurs,  Respiratory: normal WOB on RA, CTAB, no wheezes, ronchi or rales, frequent dry cough Extremities: Moving all 4 extremities equally, no peripheral edema   ASSESSMENT/PLAN:   Assessment & Plan Chronic cough Chronic not improving greater than two months. May have GERD component. Decreased appetite. No infectious symptoms, or hemoptysis. Needs further evaluate with imaging.  - CXR ordered - Robitussin DM q6h prn max for 1 week - Trial albuterol inhaler q6h prn - Consider spirometry and CT at follow-up Gastroesophageal reflux disease without esophagitis - Increase Omeprazole  to 20mg   BID  Osteoporosis -Prolia  labs next -> Calcium  and Vitamin D   Return in about 1 week (around 05/27/2024).  Ozell Provencal, MD, PGY-3 [facility] 12:19 PM 05/20/2024  [facility]

## 2024-05-20 NOTE — Patient Instructions (Addendum)
 It was great to see you! Thank you for allowing me to participate in your care!  Our plans for today:   VISIT SUMMARY: You visited us  today because of a persistent cough that has lasted for over two months, which is worse in the evenings and affects your appetite and energy levels. We discussed your symptoms, including associated chest and back pain, and reviewed your current treatments.  YOUR PLAN: CHRONIC COUGH: You have had a persistent cough for over two months that worsens in the evening and affects your appetite and energy. -We have ordered a chest x-ray to check your lung condition. -You are prescribed Robitussin DM 5 mL every 6 hours as needed for your cough. Be aware that it may cause drowsiness or confusion. -Continue using your current allergy medications, including Flonase . - I have also sent an inhaler to your pharmacy to try call albuterol, hopefully this will help as well.  GASTROESOPHAGEAL REFLUX DISEASE (GERD): Your GERD symptoms have worsened, especially at night, and may be contributing to your chronic cough. -Increase your omeprazole  to twice daily. -Continue taking famotidine  as previously prescribed.                      Contains text generated by [employer].                                 Contains text generated by [employer].    Please arrive 15 minutes PRIOR to your next scheduled appointment time! If you do not, this affects OTHER patients' care.  Take care and seek immediate care sooner if you develop any concerns.   Ozell Provencal, MD, PGY-3 [facility] 12:15 PM 05/20/2024  [facility]

## 2024-05-22 ENCOUNTER — Encounter: Payer: Self-pay | Admitting: Pharmacist

## 2024-05-22 NOTE — Progress Notes (Signed)
 This patient is appearing on a report for being at risk of failing the adherence measure for cholesterol (statin) medications this calendar year.   Medication: atorvastatin  Last fill date: 04/23/24 for 90 day supply - following phone call and provision of refill.   Contacted pharmacy to facilitate refills.

## 2024-05-27 ENCOUNTER — Ambulatory Visit: Payer: Self-pay | Admitting: Family Medicine

## 2024-05-27 DIAGNOSIS — J189 Pneumonia, unspecified organism: Secondary | ICD-10-CM

## 2024-05-28 NOTE — Telephone Encounter (Signed)
 Patient's niece LVM on nurse line yesterday AM returning call to Dr. Alba regarding results.   Requesting returned call at [phone].  Chiquita JAYSON English, RN

## 2024-05-28 NOTE — Telephone Encounter (Signed)
 Discussed chest x ray results with patient's niece, who is primary contact for medical needs. Discussed recommendation to treat for pneumonia given right lower lobe findings and extent of cough. Augmentin 875-124mg  BID for 7 days sent to patient's pharmacy. Niece verbalized understanding. Recommended to follow-up if not improving.

## 2024-06-05 ENCOUNTER — Ambulatory Visit: Payer: Self-pay | Admitting: Family Medicine

## 2024-06-05 ENCOUNTER — Encounter: Payer: Self-pay | Admitting: Family Medicine

## 2024-06-05 VITALS — BP 135/83 | HR 85 | Ht 63.0 in | Wt 195.4 lb

## 2024-06-05 DIAGNOSIS — M810 Age-related osteoporosis without current pathological fracture: Secondary | ICD-10-CM | POA: Diagnosis not present

## 2024-06-05 DIAGNOSIS — R053 Chronic cough: Secondary | ICD-10-CM

## 2024-06-05 MED ORDER — PREDNISONE 50 MG PO TABS: 50.0000 mg | ORAL_TABLET | Freq: Every day | ORAL | 0 refills | Status: DC

## 2024-06-05 NOTE — Patient Instructions (Addendum)
 It was great to see you! Thank you for allowing me to participate in your care!  Our plans for today:   VISIT SUMMARY: During your visit, we discussed your persistent cough that has lasted for two months and reviewed your current medications and health concerns.  YOUR PLAN: CHRONIC COUGH: You have had a persistent cough for two months that produces white, sometimes foamy sputum. Previous treatments have not been effective. -We will conduct lung function tests to assess for COPD. -You will take a 5-day course of oral prednisone . -If prednisone  is not effective, we may consider a daily inhaler for COPD. -I have ordered a CT scan of you chest, they should call you to schedule this -Stop taking Robitussin as it has not been effective and causes drowsiness.  OSTEOPOROSIS: You are managing osteoporosis with calcium  and vitamin D  supplements. -Continue taking your calcium  and vitamin D  supplements as directed. - I will let you know the results of your lab testing    Please arrive 15 minutes PRIOR to your next scheduled appointment time! If you do not, this affects OTHER patients' care.  Take care and seek immediate care sooner if you develop any concerns.   Ozell Provencal, MD, PGY-3 [facility] 2:31 PM 06/05/2024  [facility]

## 2024-06-05 NOTE — Assessment & Plan Note (Signed)
 On Prolia .  Due for 67-month labs.  CMP and vitamin D  ordered.

## 2024-06-05 NOTE — Progress Notes (Signed)
" ° ° °  SUBJECTIVE:   CHIEF COMPLAINT / HPI: f/u  Discussed the use of AI scribe software for clinical note transcription with the patient, who gave verbal consent to proceed.  History of Present Illness Tracey Riddle is a 73 year old female who presents with a persistent cough for two months.  Cough - Persistent for two months - Productive of white, sometimes foamy sputum described as like a bubble - No improvement with increased omeprazole , completed antibiotic course, or over-the-counter Robitussin - Robitussin causes drowsiness without symptom relief  Medication intolerance and concerns - Concerned about using prednisone  again after prolonged prior use - Robitussin causes drowsiness  Smoking history - Smoked for approximately 10 years in the past - Quit smoking long ago  Respiratory history - No history of asthma  Supplement use - Takes calcium  and vitamin D     PERTINENT  PMH / PSH: Hypertension, cirrhosis, type 2 diabetes, HLD, osteoporosis overactive bladder, history of stroke , smoking history  OBJECTIVE:   BP 135/83   Pulse 85   Ht 5' 3 (1.6 m)   Wt 195 lb 6 oz (88.6 kg)   SpO2 96%   BMI 34.61 kg/m   Physical Exam General: NAD, well appearing Neuro: A&O HEENT: mildly erythema of nasal turbinates, no posterior oropharyngeal erythema Cardiovascular: RRR, no murmurs,  Respiratory: normal WOB on RA, CTAB, no wheezes, ronchi or rales Extremities: Moving all 4 extremities equally, no peripheral edema   ASSESSMENT/PLAN:   Assessment & Plan Chronic cough Ongoing chronic cough lasting greater than 8 weeks not relieved by increased GERD regimen, treatment for community-acquired pneumonia, or continued allergic rhinitis relief. - Given remote smoking history will schedule for PFTs with Dr. Koval - Trial 5-day prednisone  burst 50 mg for 5 days - CT chest ordered Age related osteoporosis, unspecified pathological fracture presence On Prolia .  Due for 78-month  labs.  CMP and vitamin D  ordered.   Return in about 3 weeks (around 06/26/2024).  Ozell Provencal, MD, PGY-3 [facility] 2:45 PM 06/05/2024  [facility]   "

## 2024-06-06 LAB — COMPREHENSIVE METABOLIC PANEL WITH GFR
ALT: 25 IU/L (ref 0–32)
AST: 27 IU/L (ref 0–40)
Albumin: 4.6 g/dL (ref 3.8–4.8)
Alkaline Phosphatase: 58 IU/L (ref 49–135)
BUN/Creatinine Ratio: 17 (ref 12–28)
BUN: 12 mg/dL (ref 8–27)
Bilirubin Total: 0.2 mg/dL (ref 0.0–1.2)
CO2: 22 mmol/L (ref 20–29)
Calcium: 9.9 mg/dL (ref 8.7–10.3)
Chloride: 99 mmol/L (ref 96–106)
Creatinine, Ser: 0.69 mg/dL (ref 0.57–1.00)
Globulin, Total: 3 g/dL (ref 1.5–4.5)
Glucose: 107 mg/dL — ABNORMAL HIGH (ref 70–99)
Potassium: 4.1 mmol/L (ref 3.5–5.2)
Sodium: 136 mmol/L (ref 134–144)
Total Protein: 7.6 g/dL (ref 6.0–8.5)
eGFR: 90 mL/min/1.73

## 2024-06-06 LAB — VITAMIN D 25 HYDROXY (VIT D DEFICIENCY, FRACTURES): Vit D, 25-Hydroxy: 32.4 ng/mL (ref 30.0–100.0)

## 2024-06-08 ENCOUNTER — Ambulatory Visit: Payer: Self-pay | Admitting: Family Medicine

## 2024-06-08 ENCOUNTER — Encounter: Payer: Self-pay | Admitting: Podiatry

## 2024-06-08 ENCOUNTER — Ambulatory Visit: Admitting: Podiatry

## 2024-06-08 DIAGNOSIS — E119 Type 2 diabetes mellitus without complications: Secondary | ICD-10-CM | POA: Diagnosis not present

## 2024-06-08 DIAGNOSIS — M79674 Pain in right toe(s): Secondary | ICD-10-CM

## 2024-06-08 DIAGNOSIS — M79675 Pain in left toe(s): Secondary | ICD-10-CM | POA: Diagnosis not present

## 2024-06-08 DIAGNOSIS — B351 Tinea unguium: Secondary | ICD-10-CM | POA: Diagnosis not present

## 2024-06-08 NOTE — Progress Notes (Signed)
 This patient returns to my office for at risk foot care.  This patient requires this care by a professional since this patient will be at risk due to having type 2 diabetes.  This patient is unable to cut nails himself since the patient cannot reach his nails.These nails are painful walking and wearing shoes.  This patient presents for at risk foot care today.  General Appearance  Alert, conversant and in no acute stress.  Vascular  Dorsalis pedis and posterior tibial  pulses are palpable  bilaterally.  Capillary return is within normal limits  bilaterally. Temperature is within normal limits  bilaterally.  Neurologic  Senn-Weinstein monofilament wire test within normal limits  bilaterally. Muscle power within normal limits bilaterally.  Nails Thick disfigured discolored nails with subungual debris  from hallux to fifth toes bilaterally. No evidence of bacterial infection or drainage bilaterally.  Orthopedic  No limitations of motion  feet .  No crepitus or effusions noted.  No bony pathology or digital deformities noted.  Skin  normotropic skin with no porokeratosis noted bilaterally.  No signs of infections or ulcers noted.     Onychomycosis  Pain in right toes  Pain in left toes  Consent was obtained for treatment procedures.   Mechanical debridement of nails 1-5  bilaterally performed with a nail nipper.  Filed with dremel without incident.    Return office visit    3 months                 Told patient to return for periodic foot care and evaluation due to potential at risk complications.   Helane Gunther DPM

## 2024-06-16 ENCOUNTER — Ambulatory Visit (INDEPENDENT_AMBULATORY_CARE_PROVIDER_SITE_OTHER): Admitting: Pharmacist

## 2024-06-16 ENCOUNTER — Encounter: Payer: Self-pay | Admitting: Pharmacist

## 2024-06-16 ENCOUNTER — Ambulatory Visit
Admission: RE | Admit: 2024-06-16 | Discharge: 2024-06-16 | Disposition: A | Discharge status: Home / Self Care | Source: Ambulatory Visit | Attending: Family Medicine | Admitting: Family Medicine

## 2024-06-16 VITALS — BP 143/79 | HR 72 | Ht 63.0 in | Wt 196.0 lb

## 2024-06-16 DIAGNOSIS — R053 Chronic cough: Secondary | ICD-10-CM

## 2024-06-16 DIAGNOSIS — R0602 Shortness of breath: Secondary | ICD-10-CM

## 2024-06-16 NOTE — Progress Notes (Signed)
" ° °  S:       Chief Complaint  Patient presents with   Medication Management    PFT - Spirometry   73 y.o. female who presents for diabetes evaluation, education, and management. Patient arrives in good spirits and presents with assistance of a cane. Patient is accompanied by her niece.   Patient was referred and last seen by Primary Care Provider, Dr. Alba, on 06/05/2024.  At last visit, patient had complaint of cough and was scheduled for PFT.   PMH is significant for recent treatment with prednisone  which is reported to have helped with cough and breathing.  Patient reports use of albuterol  twice daily .   Patient reports breathing has been good since completing prednisone . .   Medication adherence reported good. Patient reports last dose of COPD medications was yesterday.  O: Review of Systems  Respiratory:  Positive for cough and shortness of breath. Negative for sputum production and wheezing.   All other systems reviewed and are negative.    Physical Exam Vitals reviewed.  Constitutional:      Appearance: Normal appearance.  Pulmonary:     Effort: Pulmonary effort is normal.  Neurological:     Mental Status: She is alert.  Psychiatric:        Mood and Affect: Mood normal.        Thought Content: Thought content normal.     Vitals:   06/16/24 1354 06/16/24 1411  BP: (!) 141/79 (!) 143/79  Pulse: 72   SpO2: 99%     mMRC score= 2  See scanned report or Documentation Flowsheet (discrete results - PFTs) for Spirometry results. Patient provided good effort while attempting spirometry.   Lung Age = 52 Albuterol  Neb  Lot# [id number]     Exp. 11/15/2024  Patient is participating in a Managed Medicaid Plan:  Yes   A/P: Patient has been experiencing shortness of breath and cough since recent respiratory exacerbation. Spirometry evaluation with pre- and post-bronchodilator reveals significant improvement in FEV1 of 20% indicative of reversible lung disease post  infection. She is currently using albuterol  and was encouraged to continue with use PRN as she recovers from her exacerbation.  -Reviewed results of pulmonary function tests.  Pt and niece verbalized understanding of results and education.    Written patient instructions provided.   Total time in face to face counseling 36 minutes.    Follow-up:  Pharmacist PRN PCP clinic visit 07/06/2024  "

## 2024-06-16 NOTE — Assessment & Plan Note (Signed)
 Patient has been experiencing shortness of breath and cough since recent respiratory exacerbation. Spirometry evaluation with pre- and post-bronchodilator reveals significant improvement in FEV1 of 20% indicative of reversible lung disease post infection. She is currently using albuterol  and was encouraged to continue with use PRN as she recovers from her exacerbation.  -Reviewed results of pulmonary function tests.  Pt and niece verbalized understanding of results and education.

## 2024-06-16 NOTE — Patient Instructions (Signed)
 It was nice to see you today!  Your lung function test showed improvement with albuterol  inhaler.  Please continue to use this when you have symptoms.  Keep walking to help with your breathing.   Medication Changes: Continue all other medication the same.

## 2024-06-16 NOTE — Addendum Note (Signed)
 Addended by: AMALIA MAUDE MATSU on: 06/16/2024 02:38 PM   Modules accepted: Level of Service

## 2024-06-19 ENCOUNTER — Other Ambulatory Visit: Payer: Self-pay | Admitting: Student

## 2024-06-19 DIAGNOSIS — G8929 Other chronic pain: Secondary | ICD-10-CM

## 2024-06-22 ENCOUNTER — Ambulatory Visit (INDEPENDENT_AMBULATORY_CARE_PROVIDER_SITE_OTHER): Payer: Self-pay

## 2024-06-22 VITALS — BP 132/70 | HR 72 | Temp 97.4°F

## 2024-06-22 DIAGNOSIS — M810 Age-related osteoporosis without current pathological fracture: Secondary | ICD-10-CM | POA: Diagnosis not present

## 2024-06-22 MED ORDER — DENOSUMAB 60 MG/ML ~~LOC~~ SOSY
60.0000 mg | PREFILLED_SYRINGE | Freq: Once | SUBCUTANEOUS | Status: AC
  Administered 2024-06-22: 60 mg via SUBCUTANEOUS

## 2024-06-22 NOTE — Progress Notes (Signed)
 Reviewed and agree with Dr Rennis plan.

## 2024-06-22 NOTE — Progress Notes (Signed)
 Patient presents to nurse clinic for prolia  injection. Patient had recent labs on 06/05/24, labs WNL. Received orders from Dr. Alba for Prolia  injection.   Patient denies recent illness. Vitals below.   Vitals:   06/22/24 1413  BP: 132/70  Pulse: 72  Temp: (!) 97.4 F (36.3 C)  SpO2: 98%   Administered prolia  SQ in right arm. Patient observed for 10 minutes post injection, no signs of adverse reaction noted.   Chiquita JAYSON English, RN

## 2024-06-29 ENCOUNTER — Encounter: Payer: Self-pay | Admitting: Family Medicine

## 2024-06-29 ENCOUNTER — Telehealth: Payer: Self-pay

## 2024-06-29 DIAGNOSIS — Q254 Congenital malformation of aorta unspecified: Secondary | ICD-10-CM | POA: Insufficient documentation

## 2024-06-29 DIAGNOSIS — R918 Other nonspecific abnormal finding of lung field: Secondary | ICD-10-CM | POA: Insufficient documentation

## 2024-06-29 NOTE — Telephone Encounter (Signed)
 Slater with [facility] calls nurse line to give call report.   She reports CT Chest impression #2.  Advised will forward to PCP.

## 2024-07-02 ENCOUNTER — Other Ambulatory Visit: Payer: Self-pay | Admitting: Family Medicine

## 2024-07-02 DIAGNOSIS — N3941 Urge incontinence: Secondary | ICD-10-CM

## 2024-07-06 ENCOUNTER — Encounter: Payer: Self-pay | Admitting: Family Medicine

## 2024-07-06 ENCOUNTER — Ambulatory Visit: Payer: Self-pay | Admitting: Family Medicine

## 2024-07-06 ENCOUNTER — Encounter: Payer: Self-pay | Admitting: Pharmacist

## 2024-07-06 VITALS — BP 136/75 | HR 73 | Ht 63.0 in | Wt 193.8 lb

## 2024-07-06 DIAGNOSIS — J984 Other disorders of lung: Secondary | ICD-10-CM | POA: Insufficient documentation

## 2024-07-06 DIAGNOSIS — J449 Chronic obstructive pulmonary disease, unspecified: Secondary | ICD-10-CM | POA: Diagnosis not present

## 2024-07-06 DIAGNOSIS — R918 Other nonspecific abnormal finding of lung field: Secondary | ICD-10-CM

## 2024-07-06 MED ORDER — STIOLTO RESPIMAT 2.5-2.5 MCG/ACT IN AERS: 2.0000 | INHALATION_SPRAY | Freq: Every day | RESPIRATORY_TRACT | 0 refills | Status: AC

## 2024-07-06 NOTE — Assessment & Plan Note (Signed)
 Seen on recent CT for evaluation of chronic cough.  Per imaging guidelines.  Repeat CT in 3 to 6 months.  Will repeat CT in 3 months.  Discussed with patient. - Consider referral to pulmonology

## 2024-07-06 NOTE — Progress Notes (Signed)
" ° ° °  SUBJECTIVE:   CHIEF COMPLAINT / HPI: f/u cough  Discussed the use of AI scribe software for clinical note transcription with the patient, who gave verbal consent to proceed.  History of Present Illness Tracey Riddle is a 74 year old female who presents for follow-up of lung nodules and back pain management.  Pulmonary nodules - Recent CT demonstrated small lung nodules. - Inquires about the possibility of nodule resolution.  Cough - Chronic cough has resolved following a five-day course of steroids. - Sleep quality has improved since resolution of cough.  Back pain - Back pain has improved with use of muscle relaxants. - No longer requires support belt in the morning.    PERTINENT  PMH / PSH: Hypertension, cirrhosis, type 2 diabetes, HLD, osteoporosis overactive bladder, history of stroke, smoking history   OBJECTIVE:   BP 136/75   Pulse 73   Ht 5' 3 (1.6 m)   Wt 193 lb 12.8 oz (87.9 kg)   SpO2 98%   BMI 34.33 kg/m   Physical Exam General: NAD, well appearing Neuro: A&O Cardiovascular: RRR, no murmurs,  Respiratory: normal WOB on RA, CTAB, no wheezes, ronchi or rales Abdomen: soft, NTTP, no rebound or guarding Extremities: Moving all 4 extremities equally   ASSESSMENT/PLAN:   Assessment & Plan Chronic obstructive pulmonary disease, unspecified COPD type (HCC) Lung disease Recent spirometry positive for significant prepost change in FEV1.  However, FEV1/FVC ratio not consistent with obstructive disease.  Given smoking history, and CT findings of emphysema, I would still favor variant COPD versus new asthma a 74 year old. - Continue as needed albuterol  inhaler - Start Stiolto Respimat  2 inhalations daily for maintenance Pulmonary nodules/lesions, multiple Seen on recent CT for evaluation of chronic cough.  Per imaging guidelines.  Repeat CT in 3 to 6 months.  Will repeat CT in 3 months.  Discussed with patient. - Consider referral to  pulmonology    Return in about 2 months (around 09/03/2024).  Ozell Provencal, MD, PGY-3 [facility] 12:20 PM 07/06/2024  [facility]   "

## 2024-07-06 NOTE — Assessment & Plan Note (Signed)
 Recent spirometry positive for significant prepost change in FEV1.  However, FEV1/FVC ratio not consistent with obstructive disease.  Given smoking history, and CT findings of emphysema, I would still favor variant COPD versus new asthma a 74 year old. - Continue as needed albuterol  inhaler - Start Stiolto Respimat  2 inhalations daily for maintenance

## 2024-07-06 NOTE — Patient Instructions (Signed)
 It was great to see you! Thank you for allowing me to participate in your care!  Our plans for today:   VISIT SUMMARY: Today, we discussed the follow-up of your lung nodules and back pain management. Your chronic cough has resolved, and your sleep quality has improved. Your back pain has also improved with the use of muscle relaxants.  YOUR PLAN: PULMONARY NODULES: Your recent CT scan showed small lung nodules. These could be benign or possibly lung cancer. -Repeat CT scan in two months to monitor the nodules for any growth. -Consider a referral to a lung specialist if the nodules persist after the second CT scan.  CHRONIC LUNG DISEASE: Your lung function has improved with the use of steroids and an inhaler. -Start using a daily inhaler to maintain lung function.  CHRONIC BACK PAIN: Your back pain has significantly improved with the use of muscle relaxants. -Continue taking your current muscle relaxant medication.    Please arrive 15 minutes PRIOR to your next scheduled appointment time! If you do not, this affects OTHER patients' care.  Take care and seek immediate care sooner if you develop any concerns.   Ozell Provencal, MD, PGY-3 [facility] 11:52 AM 07/06/2024  [facility]

## 2024-07-06 NOTE — Progress Notes (Signed)
 This patient is appearing on a report for being at risk of failing the adherence measure for cholesterol (statin) medications this calendar year.   Medication: atorvastatin  20 mg Last fill date: 04/23/24 for 90 day supply  Reviewed medication indication, dosing, and goals of therapy.

## 2024-07-06 NOTE — Progress Notes (Signed)
 See office visit with me 07/06/24

## 2024-07-06 NOTE — Progress Notes (Signed)
 See office visit 07/06/24.

## 2024-07-11 ENCOUNTER — Other Ambulatory Visit: Payer: Self-pay | Admitting: Family Medicine

## 2024-07-11 DIAGNOSIS — E785 Hyperlipidemia, unspecified: Secondary | ICD-10-CM

## 2024-07-19 ENCOUNTER — Other Ambulatory Visit: Payer: Self-pay | Admitting: Emergency Medicine

## 2024-09-07 ENCOUNTER — Ambulatory Visit: Admitting: Podiatry

## 2024-09-10 ENCOUNTER — Ambulatory Visit: Payer: Self-pay | Admitting: Family Medicine

## 2025-01-14 ENCOUNTER — Encounter
